# Patient Record
Sex: Male | Born: 1945
Health system: Southern US, Community
[De-identification: ages and names within clinical notes are randomized; demographics above are authoritative.]

## PROBLEM LIST (undated history)

## (undated) DIAGNOSIS — E785 Hyperlipidemia, unspecified: Secondary | ICD-10-CM

## (undated) DIAGNOSIS — I639 Cerebral infarction, unspecified: Secondary | ICD-10-CM

## (undated) DIAGNOSIS — I509 Heart failure, unspecified: Secondary | ICD-10-CM

## (undated) DIAGNOSIS — I10 Essential (primary) hypertension: Secondary | ICD-10-CM

## (undated) DIAGNOSIS — H409 Unspecified glaucoma: Secondary | ICD-10-CM

## (undated) DIAGNOSIS — D649 Anemia, unspecified: Secondary | ICD-10-CM

## (undated) DIAGNOSIS — I4891 Unspecified atrial fibrillation: Secondary | ICD-10-CM

## (undated) HISTORY — PX: BACK SURGERY: SHX140

## (undated) HISTORY — DX: Heart failure, unspecified: I50.9

## (undated) HISTORY — DX: Hyperlipidemia, unspecified: E78.5

## (undated) HISTORY — DX: Essential (primary) hypertension: I10

## (undated) HISTORY — PX: OTHER SURGICAL HISTORY: SHX169

---

## 2016-05-06 ENCOUNTER — Other Ambulatory Visit: Payer: Self-pay | Admitting: Neurosurgery

## 2016-05-06 DIAGNOSIS — M47816 Spondylosis without myelopathy or radiculopathy, lumbar region: Secondary | ICD-10-CM

## 2016-05-22 ENCOUNTER — Ambulatory Visit
Admission: RE | Admit: 2016-05-22 | Discharge: 2016-05-22 | Disposition: A | Discharge status: Home / Self Care | Payer: Medicare Other | Source: Ambulatory Visit | Attending: Neurosurgery | Admitting: Neurosurgery

## 2016-05-22 DIAGNOSIS — M47816 Spondylosis without myelopathy or radiculopathy, lumbar region: Secondary | ICD-10-CM

## 2016-05-22 MED ORDER — METHYLPREDNISOLONE ACETATE 40 MG/ML INJ SUSP (RADIOLOG
120.0000 mg | Freq: Once | INTRAMUSCULAR | Status: AC
  Administered 2016-05-22: 120 mg via EPIDURAL

## 2016-05-22 MED ORDER — IOPAMIDOL (ISOVUE-M 200) INJECTION 41%
1.0000 mL | Freq: Once | INTRAMUSCULAR | Status: AC
  Administered 2016-05-22: 1 mL via EPIDURAL

## 2016-05-22 NOTE — Discharge Instructions (Signed)

## 2016-09-07 ENCOUNTER — Other Ambulatory Visit: Payer: Self-pay | Admitting: Nurse Practitioner

## 2016-09-07 DIAGNOSIS — M47816 Spondylosis without myelopathy or radiculopathy, lumbar region: Secondary | ICD-10-CM

## 2016-09-17 ENCOUNTER — Ambulatory Visit
Admission: RE | Admit: 2016-09-17 | Discharge: 2016-09-17 | Disposition: A | Discharge status: Home / Self Care | Payer: Medicare Other | Source: Ambulatory Visit | Attending: Nurse Practitioner | Admitting: Nurse Practitioner

## 2016-09-17 DIAGNOSIS — M47816 Spondylosis without myelopathy or radiculopathy, lumbar region: Secondary | ICD-10-CM

## 2016-09-17 MED ORDER — METHYLPREDNISOLONE ACETATE 40 MG/ML INJ SUSP (RADIOLOG
120.0000 mg | Freq: Once | INTRAMUSCULAR | Status: AC
  Administered 2016-09-17: 120 mg via EPIDURAL

## 2016-09-17 MED ORDER — IOPAMIDOL (ISOVUE-M 200) INJECTION 41%
1.0000 mL | Freq: Once | INTRAMUSCULAR | Status: AC
  Administered 2016-09-17: 1 mL via EPIDURAL

## 2016-09-17 NOTE — Discharge Instructions (Signed)

## 2020-03-07 ENCOUNTER — Other Ambulatory Visit: Payer: Self-pay | Admitting: Internal Medicine

## 2020-03-07 DIAGNOSIS — M5416 Radiculopathy, lumbar region: Secondary | ICD-10-CM

## 2020-03-07 DIAGNOSIS — M5136 Other intervertebral disc degeneration, lumbar region: Secondary | ICD-10-CM

## 2020-03-19 ENCOUNTER — Other Ambulatory Visit: Payer: Self-pay

## 2020-03-19 ENCOUNTER — Ambulatory Visit
Admission: RE | Admit: 2020-03-19 | Discharge: 2020-03-19 | Disposition: A | Discharge status: Home / Self Care | Payer: Medicare Other | Source: Ambulatory Visit | Attending: Internal Medicine | Admitting: Internal Medicine

## 2020-03-19 DIAGNOSIS — M5136 Other intervertebral disc degeneration, lumbar region: Secondary | ICD-10-CM

## 2020-03-19 DIAGNOSIS — M5416 Radiculopathy, lumbar region: Secondary | ICD-10-CM

## 2020-03-19 MED ORDER — IOPAMIDOL (ISOVUE-M 200) INJECTION 41%
1.0000 mL | Freq: Once | INTRAMUSCULAR | Status: AC
  Administered 2020-03-19: 1 mL via EPIDURAL

## 2020-03-19 MED ORDER — METHYLPREDNISOLONE ACETATE 40 MG/ML INJ SUSP (RADIOLOG
120.0000 mg | Freq: Once | INTRAMUSCULAR | Status: AC
  Administered 2020-03-19: 120 mg via EPIDURAL

## 2020-03-19 NOTE — Discharge Instructions (Signed)

## 2020-07-25 ENCOUNTER — Inpatient Hospital Stay (HOSPITAL_COMMUNITY): Payer: Medicare Other | Attending: Hematology | Admitting: Hematology

## 2020-07-25 ENCOUNTER — Encounter (HOSPITAL_COMMUNITY): Payer: Self-pay

## 2020-07-25 ENCOUNTER — Other Ambulatory Visit: Payer: Self-pay

## 2020-07-25 ENCOUNTER — Inpatient Hospital Stay (HOSPITAL_COMMUNITY): Payer: Medicare Other

## 2020-07-25 ENCOUNTER — Encounter (HOSPITAL_COMMUNITY): Payer: Self-pay | Admitting: Hematology

## 2020-07-25 VITALS — BP 107/56 | HR 87 | Temp 97.1°F | Resp 18 | Ht 65.5 in | Wt 163.7 lb

## 2020-07-25 DIAGNOSIS — R233 Spontaneous ecchymoses: Secondary | ICD-10-CM | POA: Diagnosis not present

## 2020-07-25 DIAGNOSIS — D72829 Elevated white blood cell count, unspecified: Secondary | ICD-10-CM | POA: Diagnosis not present

## 2020-07-25 DIAGNOSIS — I10 Essential (primary) hypertension: Secondary | ICD-10-CM | POA: Insufficient documentation

## 2020-07-25 DIAGNOSIS — E790 Hyperuricemia without signs of inflammatory arthritis and tophaceous disease: Secondary | ICD-10-CM | POA: Insufficient documentation

## 2020-07-25 DIAGNOSIS — G20 Parkinson's disease: Secondary | ICD-10-CM | POA: Diagnosis not present

## 2020-07-25 DIAGNOSIS — Z87891 Personal history of nicotine dependence: Secondary | ICD-10-CM | POA: Diagnosis not present

## 2020-07-25 DIAGNOSIS — Z8673 Personal history of transient ischemic attack (TIA), and cerebral infarction without residual deficits: Secondary | ICD-10-CM

## 2020-07-25 DIAGNOSIS — C9210 Chronic myeloid leukemia, BCR/ABL-positive, not having achieved remission: Secondary | ICD-10-CM | POA: Diagnosis present

## 2020-07-25 DIAGNOSIS — G20A1 Parkinson's disease without dyskinesia, without mention of fluctuations: Secondary | ICD-10-CM | POA: Insufficient documentation

## 2020-07-25 DIAGNOSIS — Z809 Family history of malignant neoplasm, unspecified: Secondary | ICD-10-CM | POA: Diagnosis not present

## 2020-07-25 DIAGNOSIS — I214 Non-ST elevation (NSTEMI) myocardial infarction: Secondary | ICD-10-CM

## 2020-07-25 DIAGNOSIS — R634 Abnormal weight loss: Secondary | ICD-10-CM | POA: Insufficient documentation

## 2020-07-25 DIAGNOSIS — G459 Transient cerebral ischemic attack, unspecified: Secondary | ICD-10-CM | POA: Insufficient documentation

## 2020-07-25 HISTORY — DX: Parkinson's disease without dyskinesia, without mention of fluctuations: G20.A1

## 2020-07-25 HISTORY — DX: Parkinson's disease: G20

## 2020-07-25 LAB — CBC WITH DIFFERENTIAL/PLATELET
Abs Immature Granulocytes: 41.99 10*3/uL — ABNORMAL HIGH (ref 0.00–0.07)
Band Neutrophils: 4 %
Basophils Absolute: 1.4 10*3/uL — ABNORMAL HIGH (ref 0.0–0.1)
Basophils Relative: 1 %
Blasts: 0 %
Eosinophils Absolute: 5.8 10*3/uL — ABNORMAL HIGH (ref 0.0–0.5)
Eosinophils Relative: 4 %
HCT: 41.1 % (ref 39.0–52.0)
Hemoglobin: 13.3 g/dL (ref 13.0–17.0)
Lymphocytes Relative: 4 %
Lymphs Abs: 5.8 10*3/uL — ABNORMAL HIGH (ref 0.7–4.0)
MCH: 33.4 pg (ref 26.0–34.0)
MCHC: 32.4 g/dL (ref 30.0–36.0)
MCV: 103.3 fL — ABNORMAL HIGH (ref 80.0–100.0)
Metamyelocytes Relative: 8 %
Monocytes Absolute: 2.9 10*3/uL — ABNORMAL HIGH (ref 0.1–1.0)
Monocytes Relative: 2 %
Myelocytes: 12 %
Neutro Abs: 85.4 10*3/uL — ABNORMAL HIGH (ref 1.7–7.7)
Neutrophils Relative %: 55 %
Other: 1 %
Platelets: 151 10*3/uL (ref 150–400)
Promyelocytes Relative: 9 %
RBC: 3.98 MIL/uL — ABNORMAL LOW (ref 4.22–5.81)
RDW: 16.5 % — ABNORMAL HIGH (ref 11.5–15.5)
WBC: 144.8 10*3/uL (ref 4.0–10.5)
nRBC: 0 /100 WBC
nRBC: 0.6 % — ABNORMAL HIGH (ref 0.0–0.2)

## 2020-07-25 LAB — COMPREHENSIVE METABOLIC PANEL
ALT: 9 U/L (ref 0–44)
AST: 23 U/L (ref 15–41)
Albumin: 3.8 g/dL (ref 3.5–5.0)
Alkaline Phosphatase: 64 U/L (ref 38–126)
Anion gap: 9 (ref 5–15)
BUN: 20 mg/dL (ref 8–23)
CO2: 24 mmol/L (ref 22–32)
Calcium: 9.3 mg/dL (ref 8.9–10.3)
Chloride: 104 mmol/L (ref 98–111)
Creatinine, Ser: 1.09 mg/dL (ref 0.61–1.24)
GFR calc non Af Amer: >60 mL/min (ref >60)
Glucose, Bld: 176 mg/dL — ABNORMAL HIGH (ref 70–99)
Potassium: 4 mmol/L (ref 3.5–5.1)
Sodium: 137 mmol/L (ref 135–145)
Total Bilirubin: 0.6 mg/dL (ref 0.3–1.2)
Total Protein: 6.5 g/dL (ref 6.5–8.1)

## 2020-07-25 LAB — URIC ACID: Uric Acid, Serum: 9 mg/dL — ABNORMAL HIGH (ref 3.7–8.6)

## 2020-07-25 LAB — RETICULOCYTES
Immature Retic Fract: 35.1 % — ABNORMAL HIGH (ref 2.3–15.9)
RBC.: 3.93 MIL/uL — ABNORMAL LOW (ref 4.22–5.81)
Retic Count, Absolute: 126.9 10*3/uL (ref 19.0–186.0)
Retic Ct Pct: 3.2 % — ABNORMAL HIGH (ref 0.4–3.1)

## 2020-07-25 LAB — PROTIME-INR
INR: 1.1 (ref 0.8–1.2)
Prothrombin Time: 13.8 seconds (ref 11.4–15.2)

## 2020-07-25 LAB — APTT: aPTT: 26 seconds (ref 24–36)

## 2020-07-25 LAB — FIBRINOGEN: Fibrinogen: 395 mg/dL (ref 210–475)

## 2020-07-25 LAB — LACTATE DEHYDROGENASE: LDH: 634 U/L — ABNORMAL HIGH (ref 98–192)

## 2020-07-25 MED ORDER — ALLOPURINOL 300 MG PO TABS: 300.0000 mg | ORAL_TABLET | Freq: Every day | ORAL | 1 refills | Status: DC

## 2020-07-25 NOTE — Patient Instructions (Signed)
[place] at [facility] Discharge Instructions  You were seen and examined today by Dr. Delton Coombes. Dr. Delton Coombes is a medical oncologist and hematologist, meaning he specializes in the medical management of cancer and blood disorders. Dr. Delton Coombes discussed your past medical history, current health status and family history of cancer. Dr. Delton Coombes discussed the events that lead to you being referred to the [facility].   Your white blood cells were very elevated. Dr. Delton Coombes has ordered some blood work to assess for acute versus chronic leukemia, which is a type of blood cancer. We will follow-up with you following the blood work in the next two weeks.    Thank you for choosing [place] at [facility] to provide your oncology and hematology care.  To afford each patient quality time with our provider, please arrive at least 15 minutes before your scheduled appointment time.   If you have a lab appointment with the [facility] please come in thru the Main Entrance and check in at the main information desk.  You need to re-schedule your appointment should you arrive 10 or more minutes late.  We strive to give you quality time with our providers, and arriving late affects you and other patients whose appointments are after yours.  Also, if you no show three or more times for appointments you may be dismissed from the clinic at the providers discretion.     Again, thank you for choosing [facility].  Our hope is that these requests will decrease the amount of time that you wait before being seen by our physicians.       _____________________________________________________________  Should you have questions after your visit to [facility], please contact our office at [phone] and follow the prompts.  Our office hours are 8:00 a.m. and 4:30 p.m. Monday - Friday.  Please note that voicemails left after 4:00  p.m. may not be returned until the following business day.  We are closed weekends and major holidays.  You do have access to a nurse 24-7, just call the main number to the clinic [phone] and do not press any options, hold on the line and a nurse will answer the phone.    For prescription refill requests, have your pharmacy contact our office and allow 72 hours.    Due to Covid, you will need to wear a mask upon entering the hospital. If you do not have a mask, a mask will be given to you at the Main Entrance upon arrival. For doctor visits, patients may have 1 support person age 93 or older with them. For treatment visits, patients can not have anyone with them due to social distancing guidelines and our immunocompromised population.

## 2020-07-25 NOTE — Progress Notes (Signed)
Critical value alert  WBC 144  Dr. Delton Coombes aware.  Orders received for allopruinol 300 mg.    We called patient and notified him of his lab values and the new prescription.  He verbalizes understanding.

## 2020-07-25 NOTE — Progress Notes (Signed)
I placed an introductory phone call to this patient today. I introduced myself and explained my role in the patient's care. The patient reports that he is overall feeling well, with the exception of chronic stiffness that started at least a couple of years ago. I briefly explained what he can expect during his initial appt with Dr. Delton Coombes. Patient reports no known barriers to care at this time. I provided my contact information and encouraged the patient to call with any questions or concerns.

## 2020-07-25 NOTE — Progress Notes (Signed)
[place] [address] [place] [address]   CLINIC:  Medical Oncology/Hematology  Patient Care Team: Lonia Mad, MD as PCP - General (Internal Medicine) Dishmon, Garwin Brothers, RN as Oncology Nurse Navigator (Oncology) Derek Jack, MD as Consulting Physician (Hematology)  CHIEF COMPLAINTS/PURPOSE OF CONSULTATION:  Evaluation of leukocytosis  HISTORY OF PRESENTING ILLNESS:  Kevin Love 74 y.o. male is here because of evaluation of leukocytosis, at the request of Dr. Lonia Mad from [facility] in [place]. His WBC count was 148 on 10/6.  Today he is accompanied by his wife, [name]. He reports that he has been having easy bruising on his arms intermittently in the last 6 months. He has lost about 10 lbs in the past 6 months. He denies easy bruising anywhere else, nosebleeds, hematuria or hematochezia. He has had Parkinson's disease for the past 6 years. He went to [facility] to get a cataract lasered off and his vision has improved. He reports that he has been tiring quicker over the past 4-5 years. His appetite is good. He has lost about 10 lbs in the past 6 months. He is not currently on any blood thinners. He denies having any recent infections, F/C, or night sweats. He was hospitalized for TIA about 4 years ago.  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 He quit smoking in 31-Jan-1989. His mother died from cancer. He is able to do all of his chores at home.   MEDICAL HISTORY:  History reviewed. No pertinent past medical history.  SURGICAL HISTORY: The histories are not reviewed yet. Please review them in the "History" navigator section and refresh this [place].  SOCIAL HISTORY: Social History   Socioeconomic History  . Marital status: Married    Spouse name: Not on file  . Number of children: Not on file  . Years of education: Not on file  . Highest education level: Not on  file  Occupational History  . Not on file  Tobacco Use  . Smoking status: Not on file  Substance and Sexual Activity  . Alcohol use: Not on file  . Drug use: Not on file  . Sexual activity: Not on file  Other Topics Concern  . Not on file  Social History Narrative  . Not on file   Social Determinants of Health   Financial Resource Strain:   . Difficulty of Paying Living Expenses: Not on file  Food Insecurity:   . Worried About Charity fundraiser in the Last Year: Not on file  . Ran Out of Food in the Last Year: Not on file  Transportation Needs:   . Lack of Transportation (Medical): Not on file  . Lack of Transportation (Non-Medical): Not on file  Physical Activity:   . Days of Exercise per Week: Not on file  . Minutes of Exercise per Session: Not on file  Stress:   . Feeling of Stress : Not on file  Social Connections:   . Frequency of Communication with Friends and Family: Not on file  . Frequency of Social Gatherings with Friends and Family: Not on file  . Attends Religious Services: Not on file  . Active Member of Clubs or Organizations: Not on file  . Attends Archivist Meetings: Not on file  . Marital Status: Not on file  Intimate Partner Violence:   . Fear of Current or Ex-Partner: Not on file  . Emotionally Abused: Not  on file  . Physically Abused: Not on file  . Sexually Abused: Not on file    FAMILY HISTORY: History reviewed. No pertinent family history.  ALLERGIES:  is allergic to penicillins.  MEDICATIONS:  Current Outpatient Medications  Medication Sig Dispense Refill  . ascorbic acid (QC VITAMIN C) 500 MG tablet daily    . Aspirin 81 MG CAPS Take by mouth.    . B Complex Vitamins (RA B-COMPLEX) TABS     . carbidopa-levodopa (SINEMET IR) 25-100 MG tablet Take by mouth.    . Cholecalciferol 50 MCG (2000 UT) CAPS     . donepezil (ARICEPT) 5 MG tablet     . gemfibrozil (LOPID) 600 MG tablet Take by mouth.    Marland Kitchen ibuprofen (ADVIL) 200 MG  tablet Take by mouth.    . latanoprost (XALATAN) 0.005 % ophthalmic solution     . lisinopril (ZESTRIL) 20 MG tablet     . Multiple Vitamins-Minerals (CENTRUM SILVER ADULT 50+) TABS     . tamsulosin (FLOMAX) 0.4 MG CAPS capsule Take by mouth.    Marland Kitchen VITAMIN E PO daily    . allopurinol (ZYLOPRIM) 300 MG tablet Take 1 tablet (300 mg total) by mouth daily. 30 tablet 1   No current facility-administered medications for this visit.    REVIEW OF SYSTEMS:   Review of Systems  Constitutional: Positive for fatigue (60%). Negative for appetite change, chills, diaphoresis, fever and unexpected weight change.  HENT:   Negative for nosebleeds.   Gastrointestinal: Negative for blood in stool.  Genitourinary: Positive for nocturia. Negative for hematuria.   Musculoskeletal: Positive for back pain (4/10 back and legs pain).  Hematological: Bruises/bleeds easily (bruising in arms for past 6 months).     PHYSICAL EXAMINATION: ECOG PERFORMANCE STATUS: 1 - Symptomatic but completely ambulatory  Vitals:   07/25/20 1358  BP: (!) 107/56  Pulse: 87  Resp: 18  Temp: (!) 97.1 F (36.2 C)  SpO2: 97%   Filed Weights   07/25/20 1358  Weight: 163 lb 11.2 oz (74.3 kg)   Physical Exam Vitals reviewed.  Constitutional:      Appearance: Normal appearance.  HENT:     Mouth/Throat:     Lips: No lesions.     Mouth: No oral lesions.     Dentition: No gum lesions.     Tongue: No lesions.     Palate: No mass.     Pharynx: No pharyngeal swelling or posterior oropharyngeal erythema.     Tonsils: No tonsillar exudate.  Cardiovascular:     Rate and Rhythm: Normal rate and regular rhythm.     Pulses: Normal pulses.     Heart sounds: Normal heart sounds.  Pulmonary:     Effort: Pulmonary effort is normal.     Breath sounds: Normal breath sounds.  Abdominal:     Palpations: Abdomen is soft. There is no hepatomegaly, splenomegaly or mass.     Tenderness: There is no abdominal tenderness.     Hernia: No  hernia is present.  Musculoskeletal:     Right lower leg: No edema.     Left lower leg: No edema.  Lymphadenopathy:     Upper Body:     Right upper body: No supraclavicular, axillary or pectoral adenopathy.     Left upper body: No supraclavicular, axillary or pectoral adenopathy.     Lower Body: No right inguinal adenopathy. No left inguinal adenopathy.  Neurological:     General: No focal deficit present.  Mental Status: He is alert and oriented to person, place, and time.  Psychiatric:        Mood and Affect: Mood normal.        Behavior: Behavior normal.      LABORATORY DATA:  I have reviewed the data as listed Recent Results (from the past 2160 hour(s))  Reticulocytes     Status: Abnormal   Collection Time: 07/25/20  2:43 PM  Result Value Ref Range   Retic Ct Pct 3.2 (H) 0.4 - 3.1 %   RBC. 3.93 (L) 4.22 - 5.81 MIL/uL   Retic Count, Absolute 126.9 19.0 - 186.0 K/uL   Immature Retic Fract 35.1 (H) 2.3 - 15.9 %    Comment: Performed at [facility], [address], [place], [place] [address]  APTT     Status: None   Collection Time: 07/25/20  3:26 PM  Result Value Ref Range   aPTT 26 24 - 36 seconds    Comment: Performed at [facility], [address] [place], [place] [address]  Protime-INR     Status: None   Collection Time: 07/25/20  3:26 PM  Result Value Ref Range   Prothrombin Time 13.8 11.4 - 15.2 seconds   INR 1.1 0.8 - 1.2    Comment: (NOTE) INR goal varies based on device and disease states. Performed at [facility], [address], [place], [place] [address]   Fibrinogen     Status: None   Collection Time: 07/25/20  3:26 PM  Result Value Ref Range   Fibrinogen 395 210 - 475 mg/dL    Comment: Performed at [facility], [address] [place], [place] [address]  Uric acid     Status: Abnormal   Collection Time: 07/25/20  3:26 PM  Result Value Ref Range   Uric Acid, Serum 9.0 (H) 3.7 - 8.6 mg/dL    Comment: Performed at [facility], [address],  [place], [place] [address]  Lactate dehydrogenase     Status: Abnormal   Collection Time: 07/25/20  3:26 PM  Result Value Ref Range   LDH 634 (H) 98 - 192 U/L    Comment: Performed at [facility], [address] [place],  [address]  CBC with Differential     Status: Abnormal   Collection Time: 07/25/20  3:26 PM  Result Value Ref Range   WBC 144.8 (HH) 4.0 - 10.5 K/uL    Comment: CRITICAL RESULT CALLED TO, READ BACK BY AND VERIFIED WITH: DIANE WILSON @1621  07/25/20 BY TJ    RBC 3.98 (L) 4.22 - 5.81 MIL/uL   Hemoglobin 13.3 13.0 - 17.0 g/dL   HCT 41.1 39 - 52 %   MCV 103.3 (H) 80.0 - 100.0 fL   MCH 33.4 26.0 - 34.0 pg   MCHC 32.4 30.0 - 36.0 g/dL   RDW 16.5 (H) 11.5 - 15.5 %   Platelets 151 150 - 400 K/uL   nRBC 0.6 (H) 0.0 - 0.2 %   Neutrophils Relative % 55 %    Comment: RBV; DIANE WILSON @1631  07/25/20 BY TJ   Lymphocytes Relative 4 %    Comment: RBV; DIANE WILSON @1631  07/25/20 BY TJ   Monocytes Relative 2 %    Comment: RBV; DIANE WILSON @1631  07/25/20 BY TJ   Eosinophils Relative 4 %    Comment: RBV; DIANE WILSON @1631  07/25/20 BY TJ   Basophils Relative 1 %    Comment: RBV; DIANE WILSON @1631  07/25/20 BY TJ   Band Neutrophils  4 %    Comment: RBV; DIANE WILSON @1631  52/8/41 BY TJ   Metamyelocytes Relative 8 %    Comment: RBV; DIANE WILSON @1631  07/25/20 BY TJ   Myelocytes 12 %    Comment: RBV; DIANE WILSON @1631  07/25/20 BY TJ   Promyelocytes Relative 9 %    Comment: RBV; DIANE WILSON @1631  07/25/20 BY TJ   Blasts 0 %    Comment: RBV; DIANE WILSON @1631  07/25/20 BY TJ   nRBC 0 0 /100 WBC    Comment: RBV; DIANE WILSON @1631  07/25/20 BY TJ   Other 1 %    Comment: RBV; DIANE WILSON @1631  07/25/20 BY TJ   Neutro Abs 85.4 (H) 1.7 - 7.7 K/uL    Comment: RBV; DIANE WILSON @1631  07/25/20 BY TJ   Lymphs Abs 5.8 (H) 0.7 - 4.0 K/uL    Comment: RBV; DIANE WILSON @1631  07/25/20 BY TJ   Monocytes Absolute 2.9 (H) 0.1 - 1.0 K/uL    Comment: RBV; DIANE WILSON @1631  07/25/20 BY TJ    Eosinophils Absolute 5.8 (H) 0 - 0 K/uL    Comment: RBV; DIANE WILSON @1631  07/25/20 BY TJ   Basophils Absolute 1.4 (H) 0 - 0 K/uL    Comment: RBV; DIANE WILSON @1631  07/25/20 BY TJ   Abs Immature Granulocytes 41.99 (H) 0.00 - 0.07 K/uL    Comment: RBV; DIANE WILSON @1631  07/25/20 BY TJ   WBC Morphology      MODERATE LEFT SHIFT (>5% METAS AND MYELOS,OCC PRO NOTED)    Comment: RBV; DIANE WILSON @1631  07/25/20 BY TJ Performed at [facility], [address] [place], [place] [address]   Comprehensive metabolic panel     Status: Abnormal   Collection Time: 07/25/20  3:26 PM  Result Value Ref Range   Sodium 137 135 - 145 mmol/L   Potassium 4.0 3.5 - 5.1 mmol/L   Chloride 104 98 - 111 mmol/L   CO2 24 22 - 32 mmol/L   Glucose, Bld 176 (H) 70 - 99 mg/dL    Comment: Glucose reference range applies only to samples taken after fasting for at least 8 hours.   BUN 20 8 - 23 mg/dL   Creatinine, Ser 1.09 0.61 - 1.24 mg/dL   Calcium 9.3 8.9 - 10.3 mg/dL   Total Protein 6.5 6.5 - 8.1 g/dL   Albumin 3.8 3.5 - 5.0 g/dL   AST 23 15 - 41 U/L   ALT 9 0 - 44 U/L   Alkaline Phosphatase 64 38 - 126 U/L   Total Bilirubin 0.6 0.3 - 1.2 mg/dL   GFR calc non Af Amer >60 >60 mL/min   Anion gap 9 5 - 15    Comment: Performed at [facility], [address], [place], [place] [address]    RADIOGRAPHIC STUDIES: I have personally reviewed the radiological images as listed and agreed with the findings in the report.  ASSESSMENT:  1.  Hyperleukocytosis: -Mr. Kemler is evaluated at the request of Dr. Lonia Mad for hyperleukocytosis. -CBC on 07/23/2020 shows white count 148.3 with differential showing neutrophils, bands, promyelocytes, myelocytes and occasional blasts.  Platelet count was 138 and hemoglobin 12.9. -CBC on 03/05/2020 shows white count 15.2 with hemoglobin 15.4 and normal platelet count. -CBC in November 2020 showed white count 10.3 with normal hemoglobin and platelets. -His wife noted 10 pound  weight loss in the last 6 months. -Patient reports easy bruising in the last 6 months, mainly on the upper extremities.  Denies any fevers or  night sweats.  No recent infections or hospitalizations. -Last hospitalization was approximately 3 to 4 years ago for TIA.  2.  Social/family history: -He lives at home with his wife.  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  He quit smoking in Jan 29, 1989. -Mother died of cancer, type not known to the patient.   PLAN:  1.  Hyperleukocytosis: -His prior CBC from Monday is mostly left shifted.  Physical exam does not show any palpable adenopathy or splenomegaly. -I will repeat his CBC and review his blood smear.  We will check LDH. -I talked to him about differential diagnosis including acute myeloid leukemia versus chronic myeloid leukemia. -We will send for flow cytometry and a BCR/ABL by FISH. -I will reevaluate him within 1 week.  2.  Easy bruising: -I have checked coags in our office.  PT, PTT and fibrinogen was normal.  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 -We will start him on allopurinol 300 mg daily.  All questions were answered. The patient knows to call the clinic with any problems, questions or concerns.  Derek Jack, MD 07/25/20 4:49 PM  [place] [phone]   I, Milinda Antis, am acting as a scribe for Dr. Sanda Linger.  I, Derek Jack MD, have reviewed the above documentation for accuracy and completeness, and I agree with the above.

## 2020-07-26 LAB — SAVE SMEAR(SSMR), FOR PROVIDER SLIDE REVIEW

## 2020-07-29 LAB — SURGICAL PATHOLOGY

## 2020-07-30 LAB — PATHOLOGIST SMEAR REVIEW: Path Review: 10112021

## 2020-07-31 LAB — BCR-ABL1 FISH
Cells Analyzed: 100
Cells Counted: 100

## 2020-08-05 ENCOUNTER — Other Ambulatory Visit (HOSPITAL_COMMUNITY): Payer: Self-pay | Admitting: Hematology

## 2020-08-05 ENCOUNTER — Other Ambulatory Visit: Payer: Self-pay

## 2020-08-05 ENCOUNTER — Inpatient Hospital Stay (HOSPITAL_BASED_OUTPATIENT_CLINIC_OR_DEPARTMENT_OTHER): Payer: Medicare Other | Admitting: Hematology

## 2020-08-05 VITALS — BP 119/60 | HR 76 | Temp 98.1°F | Resp 16 | Wt 165.8 lb

## 2020-08-05 DIAGNOSIS — C9210 Chronic myeloid leukemia, BCR/ABL-positive, not having achieved remission: Secondary | ICD-10-CM | POA: Insufficient documentation

## 2020-08-05 MED ORDER — DASATINIB 70 MG PO TABS: 70.0000 mg | ORAL_TABLET | Freq: Every day | ORAL | 1 refills | Status: DC

## 2020-08-05 NOTE — Progress Notes (Signed)
[place], [place] [address]   CLINIC:  Medical Oncology/Hematology  PCP:  Lonia Mad, MD No address on file  None  REASON FOR VISIT:  Follow-up for CML  PRIOR THERAPY: None  CURRENT THERAPY: Under work-up  INTERVAL HISTORY:  [name], a 73 y.o. 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 Kevin Love was last seen on 07/25/2020.  Today he is accompanied by his wife and he reports feeling well. He started taking allopurinol. He denies having any cardiac or pulmonary issues or stents placed. His easy bruising has resolved. He denies having headaches or vision changes.   REVIEW OF SYSTEMS:  Review of Systems  Constitutional: Positive for fatigue (60%). Negative for appetite change.  Eyes: Negative for eye problems.  Musculoskeletal: Positive for back pain.  Neurological: Negative for headaches.  Hematological: Does not bruise/bleed easily.  All other systems reviewed and are negative.   PAST MEDICAL/SURGICAL HISTORY:  No past medical history on file. No past surgical history on file.  SOCIAL HISTORY:  Social History   Socioeconomic History  . Marital status: Married    Spouse name: Not on file  . Number of children: Not on file  . Years of education: Not on file  . Highest education level: Not on file  Occupational History  . Not on file  Tobacco Use  . Smoking status: Not on file  Substance and Sexual Activity  . Alcohol use: Not on file  . Drug use: Not on file  . Sexual activity: Not on file  Other Topics Concern  . Not on file  Social History Narrative  . Not on file   Social Determinants of Health   Financial Resource Strain:   . Difficulty of Paying Living Expenses: Not on file  Food Insecurity:   . Worried About Charity fundraiser in the Last Year: Not on file  . Ran Out of Food in the Last Year: Not on file  Transportation Needs:   . Lack of Transportation (Medical): Not on file  . Lack of  Transportation (Non-Medical): Not on file  Physical Activity:   . Days of Exercise per Week: Not on file  . Minutes of Exercise per Session: Not on file  Stress:   . Feeling of Stress : Not on file  Social Connections:   . Frequency of Communication with Friends and Family: Not on file  . Frequency of Social Gatherings with Friends and Family: Not on file  . Attends Religious Services: Not on file  . Active Member of Clubs or Organizations: Not on file  . Attends Archivist Meetings: Not on file  . Marital Status: Not on file  Intimate Partner Violence:   . Fear of Current or Ex-Partner: Not on file  . Emotionally Abused: Not on file  . Physically Abused: Not on file  . Sexually Abused: Not on file    FAMILY HISTORY:  No family history on file.  CURRENT MEDICATIONS:  Current Outpatient Medications  Medication Sig Dispense Refill  . allopurinol (ZYLOPRIM) 300 MG tablet Take 1 tablet (300 mg total) by mouth daily. 30 tablet 1  . ascorbic acid (QC VITAMIN C) 500 MG tablet daily    . Aspirin 81 MG CAPS Take by mouth.    . B Complex Vitamins (RA B-COMPLEX) TABS     . carbidopa-levodopa (SINEMET IR) 25-100 MG tablet Take by mouth.    . Cholecalciferol 50 MCG (2000 UT) CAPS     .  donepezil (ARICEPT) 5 MG tablet     . gemfibrozil (LOPID) 600 MG tablet Take by mouth.    Marland Kitchen ibuprofen (ADVIL) 200 MG tablet Take by mouth.    . latanoprost (XALATAN) 0.005 % ophthalmic solution     . lisinopril (ZESTRIL) 20 MG tablet     . Multiple Vitamins-Minerals (CENTRUM SILVER ADULT 50+) TABS     . tamsulosin (FLOMAX) 0.4 MG CAPS capsule Take by mouth.    Marland Kitchen VITAMIN E PO daily     No current facility-administered medications for this visit.    ALLERGIES:  Allergies  Allergen Reactions  . Penicillins Rash    PHYSICAL EXAM:  Performance status (ECOG): 1 - Symptomatic but completely ambulatory  Vitals:   08/05/20 1132  BP: 119/60  Pulse: 76  Resp: 16  Temp: 98.1 F (36.7 C)    SpO2: 95%   Wt Readings from Last 3 Encounters:  08/05/20 165 lb 12.8 oz (75.2 kg)  07/25/20 163 lb 11.2 oz (74.3 kg)   Physical Exam Vitals reviewed.  Constitutional:      Appearance: Normal appearance.  Cardiovascular:     Rate and Rhythm: Normal rate and regular rhythm.     Pulses: Normal pulses.     Heart sounds: Normal heart sounds.  Pulmonary:     Effort: Pulmonary effort is normal.     Breath sounds: Normal breath sounds.  Neurological:     General: No focal deficit present.     Mental Status: He is alert and oriented to person, place, and time.  Psychiatric:        Mood and Affect: Mood normal.        Behavior: Behavior normal.     LABORATORY DATA:   I have reviewed the labs as listed.  CBC Latest Ref Rng & Units 07/25/2020  WBC 4.0 - 10.5 K/uL 144.8(HH)  Hemoglobin 13.0 - 17.0 g/dL 13.3  Hematocrit 39 - 52 % 41.1  Platelets 150 - 400 K/uL 151   CMP Latest Ref Rng & Units 07/25/2020  Glucose 70 - 99 mg/dL 176(H)  BUN 8 - 23 mg/dL 20  Creatinine 0.61 - 1.24 mg/dL 1.09  Sodium 135 - 145 mmol/L 137  Potassium 3.5 - 5.1 mmol/L 4.0  Chloride 98 - 111 mmol/L 104  CO2 22 - 32 mmol/L 24  Calcium 8.9 - 10.3 mg/dL 9.3  Total Protein 6.5 - 8.1 g/dL 6.5  Total Bilirubin 0.3 - 1.2 mg/dL 0.6  Alkaline Phos 38 - 126 U/L 64  AST 15 - 41 U/L 23  ALT 0 - 44 U/L 9      Component Value Date/Time   RBC 3.98 (L) 07/25/2020 1526   RBC 3.93 (L) 07/25/2020 1443   MCV 103.3 (H) 07/25/2020 1526   MCH 33.4 07/25/2020 1526   MCHC 32.4 07/25/2020 1526   RDW 16.5 (H) 07/25/2020 1526   LYMPHSABS 5.8 (H) 07/25/2020 1526   MONOABS 2.9 (H) 07/25/2020 1526   EOSABS 5.8 (H) 07/25/2020 1526   BASOSABS 1.4 (H) 07/25/2020 1526   Lab Results  Component Value Date   LDH 634 (H) 07/25/2020    DIAGNOSTIC IMAGING:  I have independently reviewed the scans and discussed with the patient. No results found.   ASSESSMENT:  1.  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 Lonia Mad for hyperleukocytosis. -CBC on 07/23/2020 shows white count 148.3 with differential showing neutrophils, bands, promyelocytes, myelocytes and occasional blasts.  Platelet count was 138  and hemoglobin 12.9. -CBC on 03/05/2020 shows white count 15.2 with hemoglobin 15.4 and normal platelet count. -CBC in November 2020 showed white count 10.3 with normal hemoglobin and platelets. -His wife noted 10 pound weight loss in the last 6 months. -Patient reports easy bruising in the last 6 months, mainly on the upper extremities.  Denies any fevers or night sweats.  No recent infections or hospitalizations. -Last hospitalization was approximately 3 to 4 years ago for TIA.  2.  Social/family history: -He lives at home with his wife.  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  He quit smoking in 12/17/1988. -Mother died of cancer, type not known to the patient.   PLAN:  1.  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 -We discussed normal prognosis of CML. -I have recommended treatment with second-generation TKI.  We will send a prescription for dasatinib at a lower dose of 70 mg daily.  Will titrate up as tolerated. -Reviewed labs from 07/25/2020.  White count is 144 with LDH 634.  Flow cytometry did not reveal any blasts.  Hemoglobin and platelets are normal. -I have recommended bone marrow aspirate and biopsy for morphological review and cytogenetic evaluation.  We will also obtain quantitative PCR for BCR/ABL.  We will also obtain hepatitis panel. -We will start dasatinib after the bone marrow biopsy.  2.  Easy bruising: -PT, PTT and fibrinogen was normal.  Dr. Calton Dach has checked von Willebrand's panel which was within normal limits.  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 -We will plan to check uric acid with next labs.  Orders placed this encounter:   No orders of the defined types were placed in this encounter.    Derek Jack, MD [place] [phone]   I, Milinda Antis, am acting as a scribe for Dr. Sanda Linger.  I, Derek Jack MD, have reviewed the above documentation for accuracy and completeness, and I agree with the above.

## 2020-08-05 NOTE — Patient Instructions (Signed)
[place] at [facility] Discharge Instructions  You were seen today by Dr. Delton Coombes. 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 You will be scheduled to have a bone marrow biopsy done to get your baseline. The medication you will be prescribed for the leukemia is called dasatinib taken daily. It will take about 1 week to receive the medication and don't take it until you have the bone marrow biopsy done. Dr. Delton Coombes will see you back in 1 week for labs and follow up.   Thank you for choosing [place] at [facility] to provide your oncology and hematology care.  To afford each patient quality time with our provider, please arrive at least 15 minutes before your scheduled appointment time.   If you have a lab appointment with the [place] please come in thru the Main Entrance and check in at the main information desk  You need to re-schedule your appointment should you arrive 10 or more minutes late.  We strive to give you quality time with our providers, and arriving late affects you and other patients whose appointments are after yours.  Also, if you no show three or more times for appointments you may be dismissed from the clinic at the providers discretion.     Again, thank you for choosing [facility]  Our hope is that these requests will decrease the amount of time that you wait before being seen by our physicians.       _____________________________________________________________  Should you have questions after your visit to [facility], please contact our office at [phone] between the hours of 8:00 a.m. and 4:30 p.m.  Voicemails left after 4:00 p.m. will not be returned until the following business day.  For prescription refill requests, have your pharmacy contact our office and allow 72 hours.    Cancer Center Support Programs:   > Cancer Support Group  2nd  Tuesday of the month 1pm-2pm, Journey Room

## 2020-08-06 ENCOUNTER — Telehealth (HOSPITAL_COMMUNITY): Payer: Self-pay | Admitting: Pharmacy Technician

## 2020-08-06 ENCOUNTER — Telehealth (HOSPITAL_COMMUNITY): Payer: Self-pay | Admitting: Pharmacist

## 2020-08-06 NOTE — Telephone Encounter (Signed)
Oral Oncology Patient Advocate Encounter  Received notification from [facility] that prior authorization for Sprycel is required.  PA submitted on CoverMyMeds Key [id number] Status is pending  Oral Oncology Clinic will continue to follow.  [place] Patient [place] Phone [phone] Fax [phone] 08/06/2020 12:52 PM

## 2020-08-06 NOTE — Telephone Encounter (Signed)
Oral Oncology Patient Advocate Encounter  Prior Authorization for Sprycel has been approved.    PA# [id number] Effective dates: 10/20/19 through 10/18/21  Patients co-pay is $2538.18.  Oral Oncology Clinic will continue to follow.   [place] Patient [place] Phone [phone] Fax [phone] 08/06/2020 1:05 PM

## 2020-08-06 NOTE — Telephone Encounter (Signed)
Oral Oncology Pharmacist Encounter  Received new prescription for Sprycel (dasatinib) for the treatment of newly diagnosed chronic phase CML, planned duration until disease progression or unacceptable drug toxicity. Plan to start following his bone marrow biopsy.   Prescription dose and frequency assessed. MD starting patient on a reduced dose and plans on increasing the dose as tolerated.  Current medication list in Epic reviewed, a few relevant DDIs with dasatinib identified: -Dasatinib may enhance the antiplatelet effect of aspirin and ibuprofen. Continue to monitor plt count. No baseline dose adjustment needed.   Evaluated chart and no patient barriers to medication adherence identified.   Prescription has been e-scribed to the [facility] for benefits analysis and approval.  Oral Oncology Clinic will continue to follow for insurance authorization, copayment issues, initial counseling and start date.  Darl Pikes, PharmD, BCPS, BCOP, CPP Hematology/Oncology Clinical Pharmacist Practitioner [facility] [phone]  08/06/2020 3:04 PM

## 2020-08-08 ENCOUNTER — Telehealth (HOSPITAL_COMMUNITY): Payer: Self-pay | Admitting: Pharmacy Technician

## 2020-08-08 MED FILL — SPRYCEL 70 MG TABLET: 70 | 30 days supply | Qty: 30 | Fill #0

## 2020-08-08 NOTE — Telephone Encounter (Signed)
Obtained free trial for 30 days of Sprycel over the phone with patient and [employer] Access Support.  Will use [facility] to dispense free trial while awaiting for assistance application.  [place] Patient [place] Phone [phone] Fax [phone] 08/08/2020 10:58 AM

## 2020-08-08 NOTE — Telephone Encounter (Signed)
Oral Chemotherapy Pharmacist Encounter  Patient will fill at [facility] using one time voucher fill while manufacturer assistance is pending. Patient knows the plan is to hold on starting his Sprycel until his bone marrow biopsy.  Patient Education I spoke with patient and his wife over speaker phone for overview of new oral chemotherapy medication: Sprycel (dasatinib) for the treatment of newly diagnosed chronic phase CML, planned duration until disease progression or unacceptable drug toxicity. Plan to start following his bone marrow biopsy.   Counseled patient on administration, dosing, side effects, monitoring, drug-food interactions, safe handling, storage, and disposal. Patient will take 1 tablet (70 mg total) by mouth daily.  Side effects include but not limited to: edema, diarrhea, decreased wbc/hgb/plt.   Reviewed with patient importance of keeping a medication schedule and plan for any missed doses.  After discussion with patient no patient barriers to medication adherence identified.   Mr. And Mrs. Laforge voiced understanding and appreciation. All questions answered. Medication handout provided.  Provided patient with [facility] phone number. Patient knows to call the office with questions or concerns. Oral Chemotherapy Navigation Clinic will continue to follow.  Darl Pikes, PharmD, BCPS, BCOP, CPP Hematology/Oncology Clinical Pharmacist Practitioner [facility] [phone]  08/08/2020 3:00 PM

## 2020-08-13 ENCOUNTER — Inpatient Hospital Stay (HOSPITAL_BASED_OUTPATIENT_CLINIC_OR_DEPARTMENT_OTHER): Payer: Medicare Other | Admitting: Hematology

## 2020-08-13 ENCOUNTER — Other Ambulatory Visit: Payer: Self-pay

## 2020-08-13 ENCOUNTER — Inpatient Hospital Stay (HOSPITAL_COMMUNITY): Payer: Medicare Other

## 2020-08-13 VITALS — BP 120/53 | HR 79 | Resp 17

## 2020-08-13 DIAGNOSIS — C9210 Chronic myeloid leukemia, BCR/ABL-positive, not having achieved remission: Secondary | ICD-10-CM | POA: Diagnosis not present

## 2020-08-13 LAB — URIC ACID: Uric Acid, Serum: 6.1 mg/dL (ref 3.7–8.6)

## 2020-08-13 LAB — CBC WITH DIFFERENTIAL/PLATELET
Band Neutrophils: 1 %
Basophils Absolute: 5.1 10*3/uL — ABNORMAL HIGH (ref 0.0–0.1)
Basophils Relative: 3 %
Blasts: 5 %
Eosinophils Absolute: 8.5 10*3/uL — ABNORMAL HIGH (ref 0.0–0.5)
Eosinophils Relative: 5 %
HCT: 40.7 % (ref 39.0–52.0)
Hemoglobin: 13.4 g/dL (ref 13.0–17.0)
Lymphocytes Relative: 6 %
Lymphs Abs: 10.2 10*3/uL — ABNORMAL HIGH (ref 0.7–4.0)
MCH: 34.2 pg — ABNORMAL HIGH (ref 26.0–34.0)
MCHC: 32.9 g/dL (ref 30.0–36.0)
MCV: 103.8 fL — ABNORMAL HIGH (ref 80.0–100.0)
Metamyelocytes Relative: 5 %
Monocytes Absolute: 10.2 10*3/uL — ABNORMAL HIGH (ref 0.1–1.0)
Monocytes Relative: 6 %
Myelocytes: 6 %
Neutro Abs: 98.8 10*3/uL — ABNORMAL HIGH (ref 1.7–7.7)
Neutrophils Relative %: 57 %
Platelets: 125 10*3/uL — ABNORMAL LOW (ref 150–400)
Promyelocytes Relative: 6 %
RBC: 3.92 MIL/uL — ABNORMAL LOW (ref 4.22–5.81)
RDW: 17.5 % — ABNORMAL HIGH (ref 11.5–15.5)
WBC: 170.3 10*3/uL (ref 4.0–10.5)
nRBC: 0.5 % — ABNORMAL HIGH (ref 0.0–0.2)

## 2020-08-13 LAB — HEPATITIS B SURFACE ANTIGEN: Hepatitis B Surface Ag: NONREACTIVE

## 2020-08-13 LAB — HEPATITIS B CORE ANTIBODY, TOTAL: Hep B Core Total Ab: NONREACTIVE

## 2020-08-13 LAB — HEPATITIS C ANTIBODY: HCV Ab: NONREACTIVE

## 2020-08-13 LAB — HEPATITIS B SURFACE ANTIBODY,QUALITATIVE: Hep B S Ab: NONREACTIVE

## 2020-08-13 NOTE — Progress Notes (Signed)
CRITICAL VALUE ALERT Critical value received:  WBC 170.3 Date of notification:  08/13/2020  Time of notification: 0905 Critical value read back:  Yes.   Nurse who received alert:  Karlyne Greenspan, RN MD notified time and response:  [place]. No orders at this time.

## 2020-08-13 NOTE — Progress Notes (Signed)
[place], [place] [address]   CLINIC:  Medical Oncology/Hematology  PCP:  Lonia Mad, MD No address on file  None  REASON FOR VISIT:  Follow-up for CML  PRIOR THERAPY: None  CURRENT THERAPY: Under work-up  INTERVAL HISTORY:  [name], a 74 y.o. male, seen for follow-up of CML. He is here today along with his wife for bone marrow biopsy. He reports that his Parkinson's is acting up today. His back pain is stable.  REVIEW OF SYSTEMS:  Review of Systems  Constitutional: Positive for fatigue (60%). Negative for appetite change.  Eyes: Negative for eye problems.  Musculoskeletal: Positive for back pain.  Neurological: Negative for headaches.  Hematological: Does not bruise/bleed easily.  All other systems reviewed and are negative.   PAST MEDICAL/SURGICAL HISTORY:  No past medical history on file. No past surgical history on file.  SOCIAL HISTORY:  Social History   Socioeconomic History  . Marital status: Married    Spouse name: Not on file  . Number of children: Not on file  . Years of education: Not on file  . Highest education level: Not on file  Occupational History  . Not on file  Tobacco Use  . Smoking status: Not on file  Substance and Sexual Activity  . Alcohol use: Not on file  . Drug use: Not on file  . Sexual activity: Not on file  Other Topics Concern  . Not on file  Social History Narrative  . Not on file   Social Determinants of Health   Financial Resource Strain:   . Difficulty of Paying Living Expenses: Not on file  Food Insecurity:   . Worried About Charity fundraiser in the Last Year: Not on file  . Ran Out of Food in the Last Year: Not on file  Transportation Needs:   . Lack of Transportation (Medical): Not on file  . Lack of Transportation (Non-Medical): Not on file  Physical Activity:   . Days of Exercise per Week: Not on file  . Minutes of Exercise per Session: Not on file    Stress:   . Feeling of Stress : Not on file  Social Connections:   . Frequency of Communication with Friends and Family: Not on file  . Frequency of Social Gatherings with Friends and Family: Not on file  . Attends Religious Services: Not on file  . Active Member of Clubs or Organizations: Not on file  . Attends Archivist Meetings: Not on file  . Marital Status: Not on file  Intimate Partner Violence:   . Fear of Current or Ex-Partner: Not on file  . Emotionally Abused: Not on file  . Physically Abused: Not on file  . Sexually Abused: Not on file    FAMILY HISTORY:  No family history on file.  CURRENT MEDICATIONS:  Current Outpatient Medications  Medication Sig Dispense Refill  . allopurinol (ZYLOPRIM) 300 MG tablet Take 1 tablet (300 mg total) by mouth daily. 30 tablet 1  . ascorbic acid (QC VITAMIN C) 500 MG tablet daily    . Aspirin 81 MG CAPS Take by mouth.    . B Complex Vitamins (RA B-COMPLEX) TABS     . carbidopa-levodopa (SINEMET IR) 25-100 MG tablet Take by mouth.    . Cholecalciferol 50 MCG (2000 UT) CAPS     . dasatinib (SPRYCEL) 70 MG tablet Take 1 tablet (70 mg total) by mouth daily. 30 tablet 1  .  donepezil (ARICEPT) 5 MG tablet     . gemfibrozil (LOPID) 600 MG tablet Take by mouth.    Marland Kitchen ibuprofen (ADVIL) 200 MG tablet Take by mouth.    . latanoprost (XALATAN) 0.005 % ophthalmic solution     . lisinopril (ZESTRIL) 20 MG tablet     . Multiple Vitamins-Minerals (CENTRUM SILVER ADULT 50+) TABS     . tamsulosin (FLOMAX) 0.4 MG CAPS capsule Take by mouth.    Marland Kitchen VITAMIN E PO daily     No current facility-administered medications for this visit.    ALLERGIES:  Allergies  Allergen Reactions  . Penicillins Rash    PHYSICAL EXAM:  Performance status (ECOG): 1 - Symptomatic but completely ambulatory  Vitals:   08/13/20 0822 08/13/20 0900  BP: (!) 146/67 (!) 120/53  Pulse: 86 79  Resp: 18 17  SpO2: 97% 95%   Wt Readings from Last 3 Encounters:   08/05/20 165 lb 12.8 oz (75.2 kg)  07/25/20 163 lb 11.2 oz (74.3 kg)   Physical Exam Vitals reviewed.  Constitutional:      Appearance: Normal appearance.  Neurological:     General: No focal deficit present.     Mental Status: He is alert and oriented to person, place, and time.  Psychiatric:        Mood and Affect: Mood normal.        Behavior: Behavior normal.     LABORATORY DATA:   I have reviewed the labs as listed.  CBC Latest Ref Rng & Units 08/13/2020 07/25/2020  WBC 4.0 - 10.5 K/uL 170.3(HH) 144.8(HH)  Hemoglobin 13.0 - 17.0 g/dL 13.4 13.3  Hematocrit 39 - 52 % 40.7 41.1  Platelets 150 - 400 K/uL 125(L) 151   CMP Latest Ref Rng & Units 07/25/2020  Glucose 70 - 99 mg/dL 176(H)  BUN 8 - 23 mg/dL 20  Creatinine 0.61 - 1.24 mg/dL 1.09  Sodium 135 - 145 mmol/L 137  Potassium 3.5 - 5.1 mmol/L 4.0  Chloride 98 - 111 mmol/L 104  CO2 22 - 32 mmol/L 24  Calcium 8.9 - 10.3 mg/dL 9.3  Total Protein 6.5 - 8.1 g/dL 6.5  Total Bilirubin 0.3 - 1.2 mg/dL 0.6  Alkaline Phos 38 - 126 U/L 64  AST 15 - 41 U/L 23  ALT 0 - 44 U/L 9      Component Value Date/Time   RBC 3.92 (L) 08/13/2020 0807   MCV 103.8 (H) 08/13/2020 0807   MCH 34.2 (H) 08/13/2020 0807   MCHC 32.9 08/13/2020 0807   RDW 17.5 (H) 08/13/2020 0807   LYMPHSABS 10.2 (H) 08/13/2020 0807   MONOABS 10.2 (H) 08/13/2020 0807   EOSABS 8.5 (H) 08/13/2020 0807   BASOSABS 5.1 (H) 08/13/2020 0807   Lab Results  Component Value Date   LDH 634 (H) 07/25/2020    DIAGNOSTIC IMAGING:  I have independently reviewed the scans and discussed with the patient. No results found.   ASSESSMENT:  1.  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 is evaluated at the request of Dr. Lonia Mad for hyperleukocytosis. -CBC on 07/23/2020 shows white count 148.3 with differential showing neutrophils, bands, promyelocytes, myelocytes and occasional blasts.  Platelet count was 138 and hemoglobin 12.9. -CBC on 03/05/2020 shows white count 15.2 with  hemoglobin 15.4 and normal platelet count. -CBC in November 2020 showed white count 10.3 with normal hemoglobin and platelets. -His wife noted 10 pound weight loss in the last 6 months. -Patient reports easy bruising in the last 6  months, mainly on the upper extremities.  Denies any fevers or night sweats.  No recent infections or hospitalizations. -Last hospitalization was approximately 3 to 4 years ago for TIA. -Dasatinib 70 mg daily started on 08/13/2020.  2.  Social/family history: -He lives at home with his wife.  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 -Mother died of cancer, type not known to the patient.   PLAN:  1.  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 -We will proceed with the biopsy today. We will obtain bone marrow morphology and cytogenetics as baseline. -He already received her shipment of her dasatinib 70 mg. -Reviewed blood work from today which showed white count increased to 170. Hemoglobin 13.4 and platelet count is 125. -Will reevaluate him next week to check for tumor lysis labs and CBC.  2.  Easy bruising: -VW D screen checked in [place] was negative. PT, PTT and fibrinogen was normal.  3.  Hyperuricemia: -Uric acid today 6.1. Continue allopurinol 300 mg daily. -We'll check your labs his labs next week.  Orders placed this encounter:  Orders Placed This Encounter  Procedures  . CBC with Differential/Platelet  . Comprehensive metabolic panel  . Magnesium  . Phosphorus  . Uric acid     Derek Jack, MD [place] [phone]   I, Milinda Antis, am acting as a scribe for Dr. Sanda Linger.  I, Derek Jack MD, have reviewed the above documentation for accuracy and completeness, and I agree with the above.

## 2020-08-13 NOTE — Telephone Encounter (Signed)
Oral Oncology Patient Advocate Encounter  Received a voicemail from [employer] Access [employer] rep needing the reason patient is applying for assistance.  I called [employer] back and informed them that the copay was very high and unaffordable.  Rep documented the reason and sent it to the case worker.  [place] Patient [place] Phone [phone] Fax [phone] 08/13/2020 4:01 PM

## 2020-08-13 NOTE — Progress Notes (Signed)
INDICATION: Chronic myeloid leukemia   Bone Marrow Biopsy and Aspiration Procedure Note   The patient was identified by name and date of birth, prior to start of the procedure and a timeout was performed.   An informed consent was obtained after discussing potential risks including bleeding, infection and pain.  The right posterior iliac crest was palpated, cleaned with ChloraPrep, and drapes applied.  1% lidocaine is infiltrated into the skin, subcutaneous tissue and periosteum.  Bone marrow was aspirated and smears made.  With the help of Jamshidi needle a core biopsy was obtained.  Pressure was applied to the biopsy site and bandage was placed over the biopsy site. Patient was made to lie on the back for 15 mins prior to discharge.  The procedure was tolerated well. COMPLICATIONS: None BLOOD LOSS: none Patient was discharged home in stable condition to return in 2 weeks to review results.  Patient was provided with post bone marrow biopsy instructions and instructed to call if there was any bleeding or worsening pain.  Specimens sent for flow cytometry, cytogenetics and additional studies.  Signed Derek Jack, MD

## 2020-08-13 NOTE — Telephone Encounter (Signed)
Oral Oncology Patient Advocate Encounter  Emailed patient application to complete for [facility] Patient Assistance in an effort to reduce patient's out of pocket expense for Sprycel to $0.    Application completed and faxed to [phone] on 08/08/20. (Patient faxed his portion to [facility])  [place] phone number for follow up is [phone].   This encounter will be updated until final determination.   [place] Patient [place] Phone [phone] Fax [phone] 08/13/2020 3:59 PM

## 2020-08-13 NOTE — Progress Notes (Signed)
Patient here today for bone marrow biopsy. Procedure explained and consent signed by all parties at [phone]. Patient placed in prone position with both arms above head 0836. Time out conducted at [phone] all parties agreed. Procedure started at 0843. Patient tolerated procedure well with minimal pain and discomfort. Specimens collected and labeled appropriately. Procedure completed at 0856. Dressing applied and patient reposition on back, sitting up, resting at 0858. Specimens taken to lab for processing. Patient stable and discharged home 0919.

## 2020-08-14 ENCOUNTER — Other Ambulatory Visit (HOSPITAL_COMMUNITY): Payer: Self-pay

## 2020-08-14 MED ORDER — PROCHLORPERAZINE MALEATE 10 MG PO TABS: 10.0000 mg | ORAL_TABLET | Freq: Four times a day (QID) | ORAL | 0 refills | Status: DC | PRN

## 2020-08-14 NOTE — Telephone Encounter (Signed)
Received a voicemail from [employer] that grant funding was open through [employer], and that the patient would need to apply and exhaust funds to be referred to the patient assistance foundation.  I went online to start application with [employer] and they were "at capacity" for copay assistance and the only option was to join the waitlist.    I call [employer] back this morning to inform them of the above information and a message to the access support rep letting them know that funding could not be obtained.  [place] Patient [place] Phone [phone] Fax [phone] 08/14/2020 11:40 AM

## 2020-08-15 ENCOUNTER — Other Ambulatory Visit (HOSPITAL_COMMUNITY): Payer: Self-pay

## 2020-08-15 DIAGNOSIS — C9210 Chronic myeloid leukemia, BCR/ABL-positive, not having achieved remission: Secondary | ICD-10-CM

## 2020-08-15 LAB — SURGICAL PATHOLOGY

## 2020-08-15 MED ORDER — DASATINIB 70 MG PO TABS: 70.0000 mg | ORAL_TABLET | Freq: Every day | ORAL | 1 refills | Status: DC

## 2020-08-15 NOTE — Telephone Encounter (Signed)
Claiborne Billings from [employer] called and left a voicemail that Mr Bakos has been referred to [place] [facility] [employer].  We should receive a determination within the next 24-48 hours.  [place] Patient [place] Phone [phone] Fax [phone] 08/15/2020 1:56 PM

## 2020-08-15 NOTE — Telephone Encounter (Signed)
Prescription for Sprycel has been faxed to [employer].

## 2020-08-17 ENCOUNTER — Other Ambulatory Visit (HOSPITAL_COMMUNITY): Payer: Self-pay | Admitting: Hematology

## 2020-08-17 DIAGNOSIS — D72829 Elevated white blood cell count, unspecified: Secondary | ICD-10-CM

## 2020-08-18 LAB — BCR-ABL1, CML/ALL, PCR, QUANT
E1A2 Transcript: 0.0418 %
Interpretation (BCRAL):: POSITIVE
b2a2 transcript: 4.5149 %
b3a2 transcript: 86.9332 %

## 2020-08-20 ENCOUNTER — Encounter (HOSPITAL_COMMUNITY): Payer: Self-pay | Admitting: Hematology

## 2020-08-20 NOTE — Telephone Encounter (Signed)
Oral Oncology Patient Advocate Encounter  Called [employer] to check the status of Sprycel application.  Rep I spoke with verified missing information (prescription sent 08/15/20).  She sent application for final determination to [place].  Loma Sousa will decide final determination.  Patient may need to submit all pharmacy receipts if not approved.  Determination will be made by the end of the day and we will receive by fax and phone.  [place] Patient [place] Phone [phone] Fax [phone] 08/20/2020 1:54 PM

## 2020-08-21 NOTE — Telephone Encounter (Signed)
Oral Oncology Patient Advocate Encounter  Received notification from [place] Patient [facility] that patient has been successfully enrolled into their program to receive Sprycel from the manufacturer at $0 out of pocket until  10/18/20.    I called and spoke with patient.  He knows we will have to re-apply.   Specialty Pharmacy that will dispense medication is [facility].  Patient knows to call the office with questions or concerns.   Oral Oncology Clinic will continue to follow.  [place] Patient [place] Phone [phone] Fax [phone] 08/21/2020 10:00 AM

## 2020-08-22 ENCOUNTER — Encounter (HOSPITAL_COMMUNITY): Payer: Self-pay | Admitting: Hematology

## 2020-08-22 ENCOUNTER — Other Ambulatory Visit: Payer: Self-pay

## 2020-08-22 ENCOUNTER — Inpatient Hospital Stay (HOSPITAL_COMMUNITY): Payer: Medicare Other | Attending: Hematology | Admitting: Hematology

## 2020-08-22 ENCOUNTER — Inpatient Hospital Stay (HOSPITAL_COMMUNITY): Payer: Medicare Other

## 2020-08-22 VITALS — BP 138/59 | HR 75 | Temp 97.2°F | Resp 18 | Wt 165.8 lb

## 2020-08-22 DIAGNOSIS — Z809 Family history of malignant neoplasm, unspecified: Secondary | ICD-10-CM | POA: Insufficient documentation

## 2020-08-22 DIAGNOSIS — Z79899 Other long term (current) drug therapy: Secondary | ICD-10-CM | POA: Diagnosis not present

## 2020-08-22 DIAGNOSIS — C9210 Chronic myeloid leukemia, BCR/ABL-positive, not having achieved remission: Secondary | ICD-10-CM | POA: Diagnosis present

## 2020-08-22 DIAGNOSIS — G20 Parkinson's disease: Secondary | ICD-10-CM | POA: Insufficient documentation

## 2020-08-22 DIAGNOSIS — R3912 Poor urinary stream: Secondary | ICD-10-CM | POA: Insufficient documentation

## 2020-08-22 DIAGNOSIS — I313 Pericardial effusion (noninflammatory): Secondary | ICD-10-CM | POA: Insufficient documentation

## 2020-08-22 DIAGNOSIS — R609 Edema, unspecified: Secondary | ICD-10-CM | POA: Insufficient documentation

## 2020-08-22 DIAGNOSIS — R233 Spontaneous ecchymoses: Secondary | ICD-10-CM | POA: Insufficient documentation

## 2020-08-22 DIAGNOSIS — Z9181 History of falling: Secondary | ICD-10-CM | POA: Insufficient documentation

## 2020-08-22 DIAGNOSIS — I509 Heart failure, unspecified: Secondary | ICD-10-CM | POA: Diagnosis not present

## 2020-08-22 LAB — CBC WITH DIFFERENTIAL/PLATELET
Band Neutrophils: 6 %
Basophils Absolute: 0 10*3/uL (ref 0.0–0.1)
Basophils Relative: 0 %
Blasts: 1 %
Eosinophils Absolute: 0.9 10*3/uL — ABNORMAL HIGH (ref 0.0–0.5)
Eosinophils Relative: 1 %
HCT: 37.9 % — ABNORMAL LOW (ref 39.0–52.0)
Hemoglobin: 12.2 g/dL — ABNORMAL LOW (ref 13.0–17.0)
Lymphocytes Relative: 2 %
Lymphs Abs: 1.7 10*3/uL (ref 0.7–4.0)
MCH: 33.7 pg (ref 26.0–34.0)
MCHC: 32.2 g/dL (ref 30.0–36.0)
MCV: 104.7 fL — ABNORMAL HIGH (ref 80.0–100.0)
Metamyelocytes Relative: 12 %
Monocytes Absolute: 3.4 10*3/uL — ABNORMAL HIGH (ref 0.1–1.0)
Monocytes Relative: 4 %
Myelocytes: 13 %
Neutro Abs: 54.1 10*3/uL — ABNORMAL HIGH (ref 1.7–7.7)
Neutrophils Relative %: 57 %
Platelets: 112 10*3/uL — ABNORMAL LOW (ref 150–400)
Promyelocytes Relative: 4 %
RBC: 3.62 MIL/uL — ABNORMAL LOW (ref 4.22–5.81)
RDW: 17.2 % — ABNORMAL HIGH (ref 11.5–15.5)
WBC: 85.8 10*3/uL (ref 4.0–10.5)
nRBC: 0.2 % (ref 0.0–0.2)

## 2020-08-22 LAB — PHOSPHORUS: Phosphorus: 4 mg/dL (ref 2.5–4.6)

## 2020-08-22 LAB — COMPREHENSIVE METABOLIC PANEL
ALT: 11 U/L (ref 0–44)
AST: 22 U/L (ref 15–41)
Albumin: 4 g/dL (ref 3.5–5.0)
Alkaline Phosphatase: 74 U/L (ref 38–126)
Anion gap: 7 (ref 5–15)
BUN: 18 mg/dL (ref 8–23)
CO2: 24 mmol/L (ref 22–32)
Calcium: 9.3 mg/dL (ref 8.9–10.3)
Chloride: 105 mmol/L (ref 98–111)
Creatinine, Ser: 0.9 mg/dL (ref 0.61–1.24)
GFR, Estimated: >60 mL/min (ref >60)
Glucose, Bld: 147 mg/dL — ABNORMAL HIGH (ref 70–99)
Potassium: 4.2 mmol/L (ref 3.5–5.1)
Sodium: 136 mmol/L (ref 135–145)
Total Bilirubin: 0.7 mg/dL (ref 0.3–1.2)
Total Protein: 6.9 g/dL (ref 6.5–8.1)

## 2020-08-22 LAB — MAGNESIUM: Magnesium: 2.3 mg/dL (ref 1.7–2.4)

## 2020-08-22 LAB — URIC ACID: Uric Acid, Serum: 5.4 mg/dL (ref 3.7–8.6)

## 2020-08-22 NOTE — Progress Notes (Signed)
Critical value alert:   WBC 85.8  Dr. Delton Coombes aware, no orders received.

## 2020-08-22 NOTE — Patient Instructions (Signed)
[place] at [facility] Discharge Instructions  You were seen today by Dr. Delton Coombes. He went over your recent results. Start taking 2 capsules of tamsulosin at night and see if your urinary symptoms improve. Start taking 1/2 tablet of donepezil daily. Dr. Delton Coombes will see you back in 3 weeks for labs and follow up.   Thank you for choosing [place] at [facility] to provide your oncology and hematology care.  To afford each patient quality time with our provider, please arrive at least 15 minutes before your scheduled appointment time.   If you have a lab appointment with the [facility] please come in thru the Main Entrance and check in at the main information desk  You need to re-schedule your appointment should you arrive 10 or more minutes late.  We strive to give you quality time with our providers, and arriving late affects you and other patients whose appointments are after yours.  Also, if you no show three or more times for appointments you may be dismissed from the clinic at the providers discretion.     Again, thank you for choosing [facility].  Our hope is that these requests will decrease the amount of time that you wait before being seen by our physicians.       _____________________________________________________________  Should you have questions after your visit to [facility], please contact our office at [phone] between the hours of 8:00 a.m. and 4:30 p.m.  Voicemails left after 4:00 p.m. will not be returned until the following business day.  For prescription refill requests, have your pharmacy contact our office and allow 72 hours.    Cancer Center Support Programs:   > Cancer Support Group  2nd Tuesday of the month 1pm-2pm, Journey Room

## 2020-08-22 NOTE — Progress Notes (Signed)
[place], [place] [address]   CLINIC:  Medical Oncology/Hematology  PCP:  Lonia Mad, MD No address on file  None  REASON FOR VISIT:  Follow-up for CML  PRIOR THERAPY: None  CURRENT THERAPY: Dasatinib 70 mg daily  INTERVAL HISTORY:  Kevin Love, a 74 y.o. 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 Kevin Love was last seen on 08/05/2020.  Today he is accompanied by his wife and he reports feeling well. He started taking dasatinib on 10/26 and he is taking allopurinol as well. His wife noticed that his Parkinson's in his right hand has slightly worsened, especially when he gets excited or startled, since starting dasatinib. He has been taking Aricept for many years and it is maintaining his memory. He reports having a weakened stream when he urinates. He takes Flomax every night. He denies palpitations, arrhythmias or MI's.   REVIEW OF SYSTEMS:  Review of Systems  Constitutional: Positive for fatigue (50%). Negative for appetite change.  Cardiovascular: Negative for palpitations.  Genitourinary: Positive for difficulty urinating (decreased flow) and frequency.   Neurological:       Tremor in R hand  All other systems reviewed and are negative.   PAST MEDICAL/SURGICAL HISTORY:  No past medical history on file. No past surgical history on file.  SOCIAL HISTORY:  Social History   Socioeconomic History  . Marital status: Married    Spouse name: Not on file  . Number of children: Not on file  . Years of education: Not on file  . Highest education level: Not on file  Occupational History  . Not on file  Tobacco Use  . Smoking status: Never Smoker  . Smokeless tobacco: Never Used  Substance and Sexual Activity  . Alcohol use: Never  . Drug use: Never  . Sexual activity: Not Currently  Other Topics Concern  . Not on file  Social History Narrative  . Not on file   Social Determinants of Health   Financial Resource  Strain: Low Risk   . Difficulty of Paying Living Expenses: Not hard at all  Food Insecurity: No Food Insecurity  . Worried About Charity fundraiser in the Last Year: Never true  . Ran Out of Food in the Last Year: Never true  Transportation Needs: No Transportation Needs  . Lack of Transportation (Medical): No  . Lack of Transportation (Non-Medical): No  Physical Activity: Insufficiently Active  . Days of Exercise per Week: 7 days  . Minutes of Exercise per Session: 10 min  Stress: No Stress Concern Present  . Feeling of Stress : Not at all  Social Connections: Moderately Integrated  . Frequency of Communication with Friends and Family: Three times a week  . Frequency of Social Gatherings with Friends and Family: Once a week  . Attends Religious Services: More than 4 times per year  . Active Member of Clubs or Organizations: No  . Attends Archivist Meetings: Never  . Marital Status: Married  Human resources officer Violence: Not At Risk  . Fear of Current or Ex-Partner: No  . Emotionally Abused: No  . Physically Abused: No  . Sexually Abused: No    FAMILY HISTORY:  No family history on file.  CURRENT MEDICATIONS:  Current Outpatient Medications  Medication Sig Dispense Refill  . allopurinol (ZYLOPRIM) 300 MG tablet TAKE 1 TABLET BY MOUTH EVERY DAY 30 tablet 1  . ascorbic acid (QC VITAMIN C) 500 MG tablet daily    .  Aspirin 81 MG CAPS Take by mouth.    . B Complex Vitamins (RA B-COMPLEX) TABS     . carbidopa-levodopa (SINEMET IR) 25-100 MG tablet Take by mouth.    . Cholecalciferol 50 MCG (2000 UT) CAPS     . dasatinib (SPRYCEL) 70 MG tablet Take 1 tablet (70 mg total) by mouth daily. 30 tablet 1  . donepezil (ARICEPT) 5 MG tablet     . gemfibrozil (LOPID) 600 MG tablet Take by mouth.    Marland Kitchen ibuprofen (ADVIL) 200 MG tablet Take by mouth.    . latanoprost (XALATAN) 0.005 % ophthalmic solution     . lisinopril (ZESTRIL) 20 MG tablet     . Multiple Vitamins-Minerals  (CENTRUM SILVER ADULT 50+) TABS     . prochlorperazine (COMPAZINE) 10 MG tablet Take 1 tablet (10 mg total) by mouth every 6 (six) hours as needed for nausea or vomiting. 30 tablet 0  . tamsulosin (FLOMAX) 0.4 MG CAPS capsule Take by mouth.    . vitamin E 28000 units external oil Apply topically daily.     No current facility-administered medications for this visit.    ALLERGIES:  Allergies  Allergen Reactions  . Penicillins Rash    PHYSICAL EXAM:  Performance status (ECOG): 1 - Symptomatic but completely ambulatory  Vitals:   08/22/20 1527  BP: (!) 138/59  Pulse: 75  Resp: 18  Temp: (!) 97.2 F (36.2 C)  SpO2: 99%   Wt Readings from Last 3 Encounters:  08/22/20 165 lb 12.8 oz (75.2 kg)  08/05/20 165 lb 12.8 oz (75.2 kg)  07/25/20 163 lb 11.2 oz (74.3 kg)   Physical Exam Vitals reviewed.  Constitutional:      Appearance: Normal appearance.  Cardiovascular:     Rate and Rhythm: Normal rate and regular rhythm.     Pulses: Normal pulses.     Heart sounds: Normal heart sounds.  Pulmonary:     Effort: Pulmonary effort is normal.     Breath sounds: Normal breath sounds.  Abdominal:     Palpations: Abdomen is soft. There is no hepatomegaly, splenomegaly or mass.     Tenderness: There is no abdominal tenderness.     Hernia: No hernia is present.  Musculoskeletal:     Right lower leg: Edema (trace) present.     Left lower leg: Edema (trace) present.  Neurological:     General: No focal deficit present.     Mental Status: He is alert and oriented to person, place, and time.  Psychiatric:        Mood and Affect: Mood normal.        Behavior: Behavior normal.     LABORATORY DATA:  I have reviewed the labs as listed.  CBC Latest Ref Rng & Units 08/22/2020 08/13/2020 07/25/2020  WBC 4.0 - 10.5 K/uL 85.8(HH) 170.3(HH) 144.8(HH)  Hemoglobin 13.0 - 17.0 g/dL 12.2(L) 13.4 13.3  Hematocrit 39 - 52 % 37.9(L) 40.7 41.1  Platelets 150 - 400 K/uL 112(L) 125(L) 151   CMP  Latest Ref Rng & Units 08/22/2020 07/25/2020  Glucose 70 - 99 mg/dL 147(H) 176(H)  BUN 8 - 23 mg/dL 18 20  Creatinine 0.61 - 1.24 mg/dL 0.90 1.09  Sodium 135 - 145 mmol/L 136 137  Potassium 3.5 - 5.1 mmol/L 4.2 4.0  Chloride 98 - 111 mmol/L 105 104  CO2 22 - 32 mmol/L 24 24  Calcium 8.9 - 10.3 mg/dL 9.3 9.3  Total Protein 6.5 - 8.1 g/dL 6.9 6.5  Total Bilirubin 0.3 - 1.2 mg/dL 0.7 0.6  Alkaline Phos 38 - 126 U/L 74 64  AST 15 - 41 U/L 22 23  ALT 0 - 44 U/L 11 9      Component Value Date/Time   RBC 3.62 (L) 08/22/2020 1410   MCV 104.7 (H) 08/22/2020 1410   MCH 33.7 08/22/2020 1410   MCHC 32.2 08/22/2020 1410   RDW 17.2 (H) 08/22/2020 1410   LYMPHSABS 1.7 08/22/2020 1410   MONOABS 3.4 (H) 08/22/2020 1410   EOSABS 0.9 (H) 08/22/2020 1410   BASOSABS 0.0 08/22/2020 1410    DIAGNOSTIC IMAGING:  I have independently reviewed the scans and discussed with the patient. No results found.   ASSESSMENT:  1.  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 Hodgesis evaluated at the request of Dr. Marvis Moeller hyperleukocytosis. -CBC on 07/23/2020 shows white count 148.3 with differential showing neutrophils, bands, promyelocytes, myelocytes and occasional blasts. Platelet count was 138 and hemoglobin 12.9. -CBC on 03/05/2020 shows white count 15.2 with hemoglobin 15.4 and normal platelet count. -CBC in November 2020 showed white count 10.3 with normal hemoglobin and platelets. -His wife noted 10 pound weight loss in the last 6 months. -Patient reports easy bruising in the last 6 months, mainly on the upper extremities. Denies any fevers or night sweats. No recent infections or hospitalizations. -Last hospitalization was approximately 3 to 4 years ago for TIA. -Dasatinib 70 mg daily started on 08/13/2020. -Bone marrow biopsy on 08/13/2020 with karyotype 38, XY,t(9;22), hypercellular bone marrow with morphological features consistent with CML.  2. Social/family history: -He lives at home with his  wife. 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 He quit smoking in December 07, 1988. -Mother died of cancer, type not known to the patient.   PLAN:  1.  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  No other dysplasia. -He started taking dasatinib on 08/13/2020.  Denied any GI symptoms. -Reviewed labs from today which showed white count improved to 85 from 170.  Hemoglobin is 12.2 and platelet count is 111. -He reported slight worsening of shaking of his hands from Parkinson's.  I reviewed drug interactions with carbidopa and levodopa and dasatinib.  No interactions noted. -However dasatinib and Aricept can cause QT prolongation.  Patient never had history of arrhythmias. -I will cut back on Aricept to half tablet daily. -He also developed trace edema of the ankles.  We will keep a close eye on it as dasatinib can cause fluid retention.  If there is worsening, will consider Lasix. -RTC 3 weeks with repeat labs.  2. Easy bruising: -He does not report any easy bruising or bleeding at this time.  Previous work-up including von Willebrand panel was negative.  3.  TLS prophylaxis: -Uric acid is 5.4, phosphate is 4 and magnesium 2.3.  Potassium 4.2. -Continue allopurinol 300 mg daily until white count normalizes.  4.  Difficulty urination: -He is currently on Flomax 0.4 mg.  He reports some difficulty urinating. -I will increase Flomax to 0.8 mg.  If it helps, will give prescription for increased dose.  Orders placed this encounter:  No orders of the defined types were placed in this encounter.    Derek Jack, MD [place] [phone]   I, Milinda Antis, am acting as a scribe for Dr. Sanda Linger.  I, Derek Jack MD, have reviewed the above documentation for accuracy and completeness, and I agree with the above.

## 2020-08-23 NOTE — Telephone Encounter (Signed)
Spoke to Kevin Love today.  He spoke with [employer] about the approval received first fill from them on 08/21/20.  Patient also stated that he was informed that he was approved for assistance until 09/2021.  He will verify this when he receives his approval letter.  [place] Patient [place] Phone [phone] Fax [phone] 08/23/2020 2:25 PM

## 2020-08-26 ENCOUNTER — Ambulatory Visit (HOSPITAL_COMMUNITY): Payer: Medicare Other | Admitting: Hematology

## 2020-09-11 ENCOUNTER — Inpatient Hospital Stay (HOSPITAL_BASED_OUTPATIENT_CLINIC_OR_DEPARTMENT_OTHER): Payer: Medicare Other | Admitting: Hematology

## 2020-09-11 ENCOUNTER — Inpatient Hospital Stay (HOSPITAL_COMMUNITY): Payer: Medicare Other

## 2020-09-11 ENCOUNTER — Other Ambulatory Visit: Payer: Self-pay

## 2020-09-11 VITALS — BP 146/62 | HR 76 | Temp 97.3°F | Resp 18 | Wt 172.2 lb

## 2020-09-11 DIAGNOSIS — C9210 Chronic myeloid leukemia, BCR/ABL-positive, not having achieved remission: Secondary | ICD-10-CM | POA: Diagnosis not present

## 2020-09-11 LAB — CBC WITH DIFFERENTIAL/PLATELET
Abs Immature Granulocytes: 0.02 10*3/uL (ref 0.00–0.07)
Basophils Absolute: 0.1 10*3/uL (ref 0.0–0.1)
Basophils Relative: 2 %
Eosinophils Absolute: 0.3 10*3/uL (ref 0.0–0.5)
Eosinophils Relative: 6 %
HCT: 32 % — ABNORMAL LOW (ref 39.0–52.0)
Hemoglobin: 10.1 g/dL — ABNORMAL LOW (ref 13.0–17.0)
Immature Granulocytes: 1 %
Lymphocytes Relative: 24 %
Lymphs Abs: 1 10*3/uL (ref 0.7–4.0)
MCH: 33.4 pg (ref 26.0–34.0)
MCHC: 31.6 g/dL (ref 30.0–36.0)
MCV: 106 fL — ABNORMAL HIGH (ref 80.0–100.0)
Monocytes Absolute: 0.4 10*3/uL (ref 0.1–1.0)
Monocytes Relative: 11 %
Neutro Abs: 2.3 10*3/uL (ref 1.7–7.7)
Neutrophils Relative %: 56 %
Platelets: 152 10*3/uL (ref 150–400)
RBC: 3.02 MIL/uL — ABNORMAL LOW (ref 4.22–5.81)
RDW: 17.5 % — ABNORMAL HIGH (ref 11.5–15.5)
WBC: 4.1 10*3/uL (ref 4.0–10.5)
nRBC: 0 % (ref 0.0–0.2)

## 2020-09-11 LAB — COMPREHENSIVE METABOLIC PANEL
ALT: 10 U/L (ref 0–44)
AST: 20 U/L (ref 15–41)
Albumin: 3.7 g/dL (ref 3.5–5.0)
Alkaline Phosphatase: 91 U/L (ref 38–126)
Anion gap: 7 (ref 5–15)
BUN: 17 mg/dL (ref 8–23)
CO2: 24 mmol/L (ref 22–32)
Calcium: 8.8 mg/dL — ABNORMAL LOW (ref 8.9–10.3)
Chloride: 107 mmol/L (ref 98–111)
Creatinine, Ser: 0.85 mg/dL (ref 0.61–1.24)
GFR, Estimated: >60 mL/min (ref >60)
Glucose, Bld: 118 mg/dL — ABNORMAL HIGH (ref 70–99)
Potassium: 4 mmol/L (ref 3.5–5.1)
Sodium: 138 mmol/L (ref 135–145)
Total Bilirubin: 0.5 mg/dL (ref 0.3–1.2)
Total Protein: 6.3 g/dL — ABNORMAL LOW (ref 6.5–8.1)

## 2020-09-11 LAB — LACTATE DEHYDROGENASE: LDH: 141 U/L (ref 98–192)

## 2020-09-11 LAB — PHOSPHORUS: Phosphorus: 3.3 mg/dL (ref 2.5–4.6)

## 2020-09-11 LAB — MAGNESIUM: Magnesium: 2.3 mg/dL (ref 1.7–2.4)

## 2020-09-11 LAB — URIC ACID: Uric Acid, Serum: 5 mg/dL (ref 3.7–8.6)

## 2020-09-11 MED ORDER — DASATINIB 20 MG PO TABS: 20.0000 mg | ORAL_TABLET | Freq: Every day | ORAL | 1 refills | Status: DC

## 2020-09-11 NOTE — Progress Notes (Signed)
Kevin Love, Kevin Love [id number]   CLINIC:  Medical Oncology/Hematology  PCP:  Lonia Mad, MD No address on file  None  REASON FOR VISIT:  Follow-up for CML  PRIOR THERAPY: None  CURRENT THERAPY: Dasatinib 70 mg daily  INTERVAL HISTORY:  Kevin Love, a 74 y.o. 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 Kevin Love was last seen on 08/22/2020.  Today he is accompanied by his wife and he reports feeling okay. He went to see Dr. Calton Dach on 11/23 after having cough, wheezing, ankle swelling and orthopnea for the past 1.5 weeks. Echo and CXR were performed which showed mild CHF. He started taking Lasix 40 mg after being diagnosed and was told to stop dasatinib; his orthopnea has improved since starting Lasix. His urinary stream is good and he continues taking Flomax 1 tablet daily. He reports falling recently after descending down the stairs and missing the last step.  He will see Dr. Calton Dach on 12/7.   REVIEW OF SYSTEMS:  Review of Systems  Constitutional: Positive for appetite change (75%) and fatigue (50%).  Respiratory: Positive for shortness of breath (orthopnea) and wheezing.   Cardiovascular: Positive for leg swelling (ankle swelling).  Neurological: Positive for dizziness.  All other systems reviewed and are negative.   PAST MEDICAL/SURGICAL HISTORY:  No past medical history on file. No past surgical history on file.  SOCIAL HISTORY:  Social History   Socioeconomic History   Marital status: Married    Spouse name: Not on file   Number of children: Not on file   Years of education: Not on file   Highest education level: Not on file  Occupational History   Not on file  Tobacco Use   Smoking status: Never Smoker   Smokeless tobacco: Never Used  Substance and Sexual Activity   Alcohol use: Never   Drug use: Never   Sexual activity: Not Currently  Other Topics Concern   Not on file  Social History  Narrative   Not on file   Social Determinants of Health   Financial Resource Strain: Low Risk    Difficulty of Paying Living Expenses: Not hard at all  Food Insecurity: No Food Insecurity   Worried About Charity fundraiser in the Last Year: Never true   [place] in the Last Year: Never true  Transportation Needs: No Transportation Needs   Lack of Transportation (Medical): No   Lack of Transportation (Non-Medical): No  Physical Activity: Insufficiently Active   Days of Exercise per Week: 7 days   Minutes of Exercise per Session: 10 min  Stress: No Stress Concern Present   Feeling of Stress : Not at all  Social Connections: Moderately Integrated   Frequency of Communication with Friends and Family: Three times a week   Frequency of Social Gatherings with Friends and Family: Once a week   Attends Religious Services: More than 4 times per year   Active Member of [employer] or Organizations: No   Attends Archivist Meetings: Never   Marital Status: Married  Human resources officer Violence: Not At Risk   Fear of Current or Ex-Partner: No   Emotionally Abused: No   Physically Abused: No   Sexually Abused: No    FAMILY HISTORY:  No family history on file.  CURRENT MEDICATIONS:  Current Outpatient Medications  Medication Sig Dispense Refill   allopurinol (ZYLOPRIM) 300 MG tablet TAKE 1 TABLET BY MOUTH EVERY DAY 30 tablet  1   ascorbic acid (QC VITAMIN C) 500 MG tablet daily     Aspirin 81 MG CAPS Take by mouth.     B Complex Vitamins (RA B-COMPLEX) TABS      carbidopa-levodopa (SINEMET IR) 25-100 MG tablet Take by mouth.     Cholecalciferol 50 MCG (2000 UT) CAPS      dasatinib (SPRYCEL) 70 MG tablet Take 1 tablet (70 mg total) by mouth daily. 30 tablet 1   donepezil (ARICEPT) 5 MG tablet      furosemide (LASIX) 40 MG tablet Take 40 mg by mouth daily.     ibuprofen (ADVIL) 200 MG tablet Take by mouth.     Icosapent Ethyl (VASCEPA) 0.5 g CAPS  Take 1 capsule by mouth 2 (two) times daily.     latanoprost (XALATAN) 0.005 % ophthalmic solution      lisinopril (ZESTRIL) 20 MG tablet      Multiple Vitamins-Minerals (CENTRUM SILVER ADULT 50+) TABS      prochlorperazine (COMPAZINE) 10 MG tablet Take 1 tablet (10 mg total) by mouth every 6 (six) hours as needed for nausea or vomiting. 30 tablet 0   tamsulosin (FLOMAX) 0.4 MG CAPS capsule Take by mouth.     vitamin E 28000 units external oil Apply topically daily.     No current facility-administered medications for this visit.    ALLERGIES:  Allergies  Allergen Reactions   Penicillins Rash    PHYSICAL EXAM:  Performance status (ECOG): 1 - Symptomatic but completely ambulatory  Vitals:   09/11/20 0950  BP: (!) 146/62  Pulse: 76  Resp: 18  Temp: (!) 97.3 F (36.3 C)  SpO2: 95%   Wt Readings from Last 3 Encounters:  09/11/20 172 lb 3.2 oz (78.1 kg)  08/22/20 165 lb 12.8 oz (75.2 kg)  08/05/20 165 lb 12.8 oz (75.2 kg)   Physical Exam Vitals reviewed.  Constitutional:      Appearance: Normal appearance.  Cardiovascular:     Rate and Rhythm: Normal rate and regular rhythm.     Pulses: Normal pulses.     Heart sounds: Normal heart sounds.  Pulmonary:     Effort: Pulmonary effort is normal.     Breath sounds: Examination of the right-lower field reveals decreased breath sounds. Decreased breath sounds present.  Musculoskeletal:     Right lower leg: Edema (1+) present.     Left lower leg: Edema (1+) present.  Neurological:     General: No focal deficit present.     Mental Status: He is alert and oriented to person, place, and time.  Psychiatric:        Mood and Affect: Mood normal.        Behavior: Behavior normal.     LABORATORY DATA:  I have reviewed the labs as listed.  CBC Latest Ref Rng & Units 09/11/2020 08/22/2020 08/13/2020  WBC 4.0 - 10.5 K/uL 4.1 85.8(HH) 170.3(HH)  Hemoglobin 13.0 - 17.0 g/dL 10.1(L) 12.2(L) 13.4  Hematocrit 39 - 52 % 32.0(L)  37.9(L) 40.7  Platelets 150 - 400 K/uL 152 112(L) 125(L)   CMP Latest Ref Rng & Units 09/11/2020 08/22/2020 07/25/2020  Glucose 70 - 99 mg/dL 118(H) 147(H) 176(H)  BUN 8 - 23 mg/dL 17 18 20   Creatinine 0.61 - 1.24 mg/dL 0.85 0.90 1.09  Sodium 135 - 145 mmol/L 138 136 137  Potassium 3.5 - 5.1 mmol/L 4.0 4.2 4.0  Chloride 98 - 111 mmol/L 107 105 104  CO2 22 - 32 mmol/L 24  24 24  Calcium 8.9 - 10.3 mg/dL 8.8(L) 9.3 9.3  Total Protein 6.5 - 8.1 g/dL 6.3(L) 6.9 6.5  Total Bilirubin 0.3 - 1.2 mg/dL 0.5 0.7 0.6  Alkaline Phos 38 - 126 U/L 91 74 64  AST 15 - 41 U/L 20 22 23   ALT 0 - 44 U/L 10 11 9       Component Value Date/Time   RBC 3.02 (L) 09/11/2020 0937   MCV 106.0 (H) 09/11/2020 0937   MCH 33.4 09/11/2020 0937   MCHC 31.6 09/11/2020 0937   RDW 17.5 (H) 09/11/2020 0937   LYMPHSABS 1.0 09/11/2020 0937   MONOABS 0.4 09/11/2020 0937   EOSABS 0.3 09/11/2020 0937   BASOSABS 0.1 09/11/2020 0937   Lab Results  Component Value Date   LDH 141 09/11/2020   LDH 634 (H) 07/25/2020    DIAGNOSTIC IMAGING:  I have independently reviewed the scans and discussed with the patient. No results found.   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 Hodgesis evaluated at the request of Dr. Marvis Moeller hyperleukocytosis. -CBC on 07/23/2020 shows white count 148.3 with differential showing neutrophils, bands, promyelocytes, myelocytes and occasional blasts. Platelet count was 138 and hemoglobin 12.9. -CBC on 03/05/2020 shows white count 15.2 with hemoglobin 15.4 and normal platelet count. -CBC in November 2020 showed white count 10.3 with normal hemoglobin and platelets. -His wife noted 10 pound weight loss in the last 6 months. -Patient reports easy bruising in the last 6 months, mainly on the upper extremities. Denies any fevers or night sweats. No recent infections or hospitalizations. -Last hospitalization was approximately 3 to 4 years ago for TIA. -Dasatinib 70 mg daily started on  08/13/2020. -Bone marrow biopsy on 08/13/2020 with karyotype 66, XY,t(9;22), hypercellular bone marrow with morphological features consistent with CML.  2. Social/family history: -He lives at home with his wife. 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 -Mother died of cancer, type not known to the patient.   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 -I have obtained and reviewed echo results from [facility]. -We have reviewed labs from today which shows white count completely normalized at 4.1.  Platelet count is 152.  Hemoglobin 10.1. -We will likely consider decreasing dasatinib dose to 20 mg daily.  I will send a prescription for it. -He will start at lower dose in 2 to 3 weeks after fluid retention subsides, that is around 10/02/2020. -He was given appointment in 6 weeks with repeat labs.  2.  Fluid retention: -He has developed ankle swellings and difficulty breathing.  He also had symptoms of PND and orthopnea. -He was evaluated by Dr.Isernia on 09/09/2020.  His dasatinib was held.  He was started on Lasix. -2D echo on 09/10/2020 at [facility] showed normal LV size and function with ejection fraction 55%.  Mild concentric hypertrophy.  Normal right ventricle size with function.  Mild to moderate MR.  Trivial pericardial effusion.  No pericardial tamponade. -Chest x-ray on 09/10/2020 showed increased irregular reticular markings with patchy bilateral airspace disease and tiny effusions. -He has developed dasatinib induced fluid retention. -His breathing improved.  He still has some leg swelling.  We will continue Lasix at this time.  3.  TLS prophylaxis: -Uric acid is 5.0.  White count normalized at 4.1.  He may discontinue allopurinol.  4.  Difficulty urination: -Continue Flomax 0.4 mg daily.  5.  Parkinson's disease: -Continue  carbidopa and levodopa. -We have cut back on Aricept to 2.5 mg daily due to interaction with dasatinib.  Orders placed this encounter:  No orders of the defined types were placed in this encounter.    Derek Jack, MD [place] [phone]   I, Milinda Antis, am acting as a scribe for Dr. Sanda Linger.  I, Derek Jack MD, have reviewed the above documentation for accuracy and completeness, and I agree with the above.

## 2020-09-11 NOTE — Patient Instructions (Signed)
[place] at [facility] Discharge Instructions  You were seen today by Dr. Delton Coombes. He went over your recent results. Stop taking dasatinib. Dr. Delton Coombes will see you back in 6 weeks for labs and follow up.   Thank you for choosing [place] at [facility] to provide your oncology and hematology care.  To afford each patient quality time with our provider, please arrive at least 15 minutes before your scheduled appointment time.   If you have a lab appointment with the [facility] please come in thru the Main Entrance and check in at the main information desk  You need to re-schedule your appointment should you arrive 10 or more minutes late.  We strive to give you quality time with our providers, and arriving late affects you and other patients whose appointments are after yours.  Also, if you no show three or more times for appointments you may be dismissed from the clinic at the providers discretion.     Again, thank you for choosing [facility].  Our hope is that these requests will decrease the amount of time that you wait before being seen by our physicians.       _____________________________________________________________  Should you have questions after your visit to [facility], please contact our office at [phone] between the hours of 8:00 a.m. and 4:30 p.m.  Voicemails left after 4:00 p.m. will not be returned until the following business day.  For prescription refill requests, have your pharmacy contact our office and allow 72 hours.    Cancer Center Support Programs:   > Cancer Support Group  2nd Tuesday of the month 1pm-2pm, Journey Room

## 2020-09-14 ENCOUNTER — Other Ambulatory Visit (HOSPITAL_COMMUNITY): Payer: Self-pay | Admitting: Hematology

## 2020-09-14 DIAGNOSIS — D72829 Elevated white blood cell count, unspecified: Secondary | ICD-10-CM

## 2020-09-16 ENCOUNTER — Other Ambulatory Visit (HOSPITAL_COMMUNITY): Payer: Self-pay

## 2020-09-16 ENCOUNTER — Encounter (HOSPITAL_COMMUNITY): Payer: Self-pay

## 2020-09-16 DIAGNOSIS — C9210 Chronic myeloid leukemia, BCR/ABL-positive, not having achieved remission: Secondary | ICD-10-CM

## 2020-09-16 MED ORDER — DASATINIB 20 MG PO TABS: 20.0000 mg | ORAL_TABLET | Freq: Every day | ORAL | 1 refills | Status: DC

## 2020-09-16 NOTE — Progress Notes (Signed)
Prescription sent for Sprycel 20mg  by Dr. Delton Coombes. Patient made aware of prescription. Patient instructed to remain off of Sprycel until 10/02/2020 and then resume at 20mg  daily per Dr. Delton Coombes. Patient verbalized understanding.

## 2020-09-17 ENCOUNTER — Other Ambulatory Visit (HOSPITAL_COMMUNITY): Payer: Self-pay

## 2020-09-17 DIAGNOSIS — C9210 Chronic myeloid leukemia, BCR/ABL-positive, not having achieved remission: Secondary | ICD-10-CM

## 2020-09-17 MED ORDER — DASATINIB 20 MG PO TABS: 20.0000 mg | ORAL_TABLET | Freq: Every day | ORAL | 1 refills | Status: DC

## 2020-09-26 ENCOUNTER — Telehealth (HOSPITAL_COMMUNITY): Payer: Self-pay | Admitting: Pharmacy Technician

## 2020-10-22 NOTE — Telephone Encounter (Signed)
Oral Oncology Patient Advocate Encounter  Received communication from [facility] [employer] that the patient's eligibility in the patient assistance program for Sprycel was due for re-enrollment.    Patient faxed in his portion of the application and asked me to fax him the MD portion.  MD portion was completed and faxed on 09/26/20.    The renewal application has been completed and faxed in an effort to keep the patient's out of pocket expense for Sprycel at $0.     Application completed and faxed to [phone].    [employer] [facility] [employer] patient assistance phone number for follow up is [phone].    This encounter will be updated until final determination.  Daine Floras CPHT Specialty Pharmacy Patient Advocate [facility] Phone [phone] Fax [phone]

## 2020-10-22 NOTE — Telephone Encounter (Signed)
Oral Oncology Patient Advocate Encounter  Received notification from [facility] [employer] Patient [facility] that patient has been successfully enrolled into their program to receive Sprycel from the manufacturer at $0 out of pocket until 10/18/21.    I called and spoke with patient.  He knows we will have to re-apply.   Patient knows to call the office with questions or concerns.   Oral Oncology Clinic will continue to follow.  Daine Floras CPHT Specialty Pharmacy Patient Advocate [facility] Phone [phone] Fax [phone] 10/22/2020 9:29 AM

## 2020-10-23 ENCOUNTER — Other Ambulatory Visit: Payer: Self-pay

## 2020-10-23 ENCOUNTER — Inpatient Hospital Stay (HOSPITAL_COMMUNITY): Payer: Medicare Other

## 2020-10-23 ENCOUNTER — Other Ambulatory Visit (HOSPITAL_COMMUNITY): Payer: Self-pay | Admitting: Hematology

## 2020-10-23 ENCOUNTER — Inpatient Hospital Stay (HOSPITAL_COMMUNITY): Payer: Medicare Other | Attending: Hematology | Admitting: Hematology

## 2020-10-23 VITALS — BP 138/65 | HR 75 | Temp 97.2°F | Resp 18 | Wt 160.2 lb

## 2020-10-23 DIAGNOSIS — R39198 Other difficulties with micturition: Secondary | ICD-10-CM | POA: Diagnosis not present

## 2020-10-23 DIAGNOSIS — C9210 Chronic myeloid leukemia, BCR/ABL-positive, not having achieved remission: Secondary | ICD-10-CM | POA: Insufficient documentation

## 2020-10-23 DIAGNOSIS — Z79899 Other long term (current) drug therapy: Secondary | ICD-10-CM | POA: Diagnosis not present

## 2020-10-23 DIAGNOSIS — R609 Edema, unspecified: Secondary | ICD-10-CM | POA: Insufficient documentation

## 2020-10-23 DIAGNOSIS — G20 Parkinson's disease: Secondary | ICD-10-CM | POA: Diagnosis not present

## 2020-10-23 LAB — CBC WITH DIFFERENTIAL/PLATELET
Abs Immature Granulocytes: 0.01 10*3/uL (ref 0.00–0.07)
Basophils Absolute: 0 10*3/uL (ref 0.0–0.1)
Basophils Relative: 1 %
Eosinophils Absolute: 0.2 10*3/uL (ref 0.0–0.5)
Eosinophils Relative: 4 %
HCT: 40.9 % (ref 39.0–52.0)
Hemoglobin: 13.2 g/dL (ref 13.0–17.0)
Immature Granulocytes: 0 %
Lymphocytes Relative: 30 %
Lymphs Abs: 1.7 10*3/uL (ref 0.7–4.0)
MCH: 32.5 pg (ref 26.0–34.0)
MCHC: 32.3 g/dL (ref 30.0–36.0)
MCV: 100.7 fL — ABNORMAL HIGH (ref 80.0–100.0)
Monocytes Absolute: 0.4 10*3/uL (ref 0.1–1.0)
Monocytes Relative: 7 %
Neutro Abs: 3.3 10*3/uL (ref 1.7–7.7)
Neutrophils Relative %: 58 %
Platelets: 129 10*3/uL — ABNORMAL LOW (ref 150–400)
RBC: 4.06 MIL/uL — ABNORMAL LOW (ref 4.22–5.81)
RDW: 14 % (ref 11.5–15.5)
WBC: 5.6 10*3/uL (ref 4.0–10.5)
nRBC: 0 % (ref 0.0–0.2)

## 2020-10-23 LAB — COMPREHENSIVE METABOLIC PANEL
ALT: 6 U/L (ref 0–44)
AST: 20 U/L (ref 15–41)
Albumin: 4.1 g/dL (ref 3.5–5.0)
Alkaline Phosphatase: 76 U/L (ref 38–126)
Anion gap: 8 (ref 5–15)
BUN: 19 mg/dL (ref 8–23)
CO2: 28 mmol/L (ref 22–32)
Calcium: 9.3 mg/dL (ref 8.9–10.3)
Chloride: 101 mmol/L (ref 98–111)
Creatinine, Ser: 0.89 mg/dL (ref 0.61–1.24)
GFR, Estimated: >60 mL/min (ref >60)
Glucose, Bld: 104 mg/dL — ABNORMAL HIGH (ref 70–99)
Potassium: 4.6 mmol/L (ref 3.5–5.1)
Sodium: 137 mmol/L (ref 135–145)
Total Bilirubin: 0.7 mg/dL (ref 0.3–1.2)
Total Protein: 6.8 g/dL (ref 6.5–8.1)

## 2020-10-23 LAB — PHOSPHORUS: Phosphorus: 3.5 mg/dL (ref 2.5–4.6)

## 2020-10-23 LAB — URIC ACID: Uric Acid, Serum: 6.2 mg/dL (ref 3.7–8.6)

## 2020-10-23 LAB — MAGNESIUM: Magnesium: 2.2 mg/dL (ref 1.7–2.4)

## 2020-10-23 NOTE — Progress Notes (Signed)
Kevin Love, Kevin Love   CLINIC:  Medical Oncology/Hematology  PCP:  Lonia Mad, MD No address on file  None  REASON FOR VISIT:  Follow-up for CML  PRIOR THERAPY: None  CURRENT THERAPY: Dasatinib 20 mg daily  INTERVAL HISTORY:  [name], a 75 y.o. 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 Kevin Love was last seen on 09/11/2020.  Today he is accompanied by his wife and he reports feeling well. He started taking dasatinib 20 mg on 10/02/2020 and has been tolerating it well; he denies having any leg swelling. He is also taking Lasix 20 mg QAM. His appetite is excellent and denies having N/V.   REVIEW OF SYSTEMS:  Review of Systems  Constitutional: Positive for fatigue (50%). Negative for appetite change.  Cardiovascular: Negative for leg swelling.  Gastrointestinal: Positive for constipation. Negative for nausea and vomiting.  All other systems reviewed and are negative.   PAST MEDICAL/SURGICAL HISTORY:  No past medical history on file. No past surgical history on file.  SOCIAL HISTORY:  Social History   Socioeconomic History  . Marital status: Married    Spouse name: Not on file  . Number of children: Not on file  . Years of education: Not on file  . Highest education level: Not on file  Occupational History  . Not on file  Tobacco Use  . Smoking status: Never Smoker  . Smokeless tobacco: Never Used  Substance and Sexual Activity  . Alcohol use: Never  . Drug use: Never  . Sexual activity: Not Currently  Other Topics Concern  . Not on file  Social History Narrative  . Not on file   Social Determinants of Health   Financial Resource Strain: Low Risk   . Difficulty of Paying Living Expenses: Not hard at all  Food Insecurity: No Food Insecurity  . Worried About Charity fundraiser in the Last Year: Never true  . Ran Out of Food in the Last Year: Never true  Transportation Needs: No Transportation  Needs  . Lack of Transportation (Medical): No  . Lack of Transportation (Non-Medical): No  Physical Activity: Insufficiently Active  . Days of Exercise per Week: 7 days  . Minutes of Exercise per Session: 10 min  Stress: No Stress Concern Present  . Feeling of Stress : Not at all  Social Connections: Moderately Integrated  . Frequency of Communication with Friends and Family: Three times a week  . Frequency of Social Gatherings with Friends and Family: Once a week  . Attends Religious Services: More than 4 times per year  . Active Member of Clubs or Organizations: No  . Attends Archivist Meetings: Never  . Marital Status: Married  Human resources officer Violence: Not At Risk  . Fear of Current or Ex-Partner: No  . Emotionally Abused: No  . Physically Abused: No  . Sexually Abused: No    FAMILY HISTORY:  No family history on file.  CURRENT MEDICATIONS:  Current Outpatient Medications  Medication Sig Dispense Refill  . allopurinol (ZYLOPRIM) 300 MG tablet TAKE 1 TABLET BY MOUTH EVERY DAY 30 tablet 1  . ascorbic acid (VITAMIN C) 500 MG tablet daily    . Aspirin 81 MG CAPS Take by mouth.    . B Complex Vitamins (RA B-COMPLEX) TABS     . carbidopa-levodopa (SINEMET IR) 25-100 MG tablet Take by mouth.    . Cholecalciferol 50 MCG (2000 UT) CAPS     .  clonazePAM (KLONOPIN) 1 MG tablet clonazepam 1 mg tablet    . donepezil (ARICEPT) 5 MG tablet     . furosemide (LASIX) 40 MG tablet Take 40 mg by mouth daily.    Marland Kitchen ibuprofen (ADVIL) 200 MG tablet Take by mouth.    Vanessa Kick Ethyl (VASCEPA) 0.5 g CAPS Take 1 capsule by mouth 2 (two) times daily.    Marland Kitchen latanoprost (XALATAN) 0.005 % ophthalmic solution     . lisinopril (ZESTRIL) 20 MG tablet     . Multiple Vitamins-Minerals (CENTRUM SILVER ADULT 50+) TABS     . Omeprazole 20 MG TBDD omeprazole 20 mg delayed release,disintegrating tablet  Take by oral route.    . potassium chloride SA (KLOR-CON) 20 MEQ tablet Klor-Con M20 mEq  tablet,extended release    . prochlorperazine (COMPAZINE) 10 MG tablet Take 1 tablet (10 mg total) by mouth every 6 (six) hours as needed for nausea or vomiting. 30 tablet 0  . tamsulosin (FLOMAX) 0.4 MG CAPS capsule Take by mouth.    . vitamin E 28000 units external oil Apply topically daily.    . SPRYCEL 20 MG tablet TAKE 1 TABLET BY MOUTH ONCE DAILY 30 tablet 11   No current facility-administered medications for this visit.    ALLERGIES:  Allergies  Allergen Reactions  . Penicillins Rash    PHYSICAL EXAM:  Performance status (ECOG): 1 - Symptomatic but completely ambulatory  Vitals:   10/23/20 1435  BP: 138/65  Pulse: 75  Resp: 18  Temp: (!) 97.2 F (36.2 C)  SpO2: 97%   Wt Readings from Last 3 Encounters:  10/23/20 160 lb 3.2 oz (72.7 kg)  09/11/20 172 lb 3.2 oz (78.1 kg)  08/22/20 165 lb 12.8 oz (75.2 kg)   Physical Exam Vitals reviewed.  Constitutional:      Appearance: Normal appearance.  Cardiovascular:     Rate and Rhythm: Normal rate and regular rhythm.     Pulses: Normal pulses.     Heart sounds: Normal heart sounds.  Pulmonary:     Effort: Pulmonary effort is normal.     Breath sounds: Normal breath sounds.  Abdominal:     Palpations: Abdomen is soft. There is no hepatomegaly, splenomegaly or mass.     Tenderness: There is no abdominal tenderness.     Hernia: No hernia is present.  Musculoskeletal:     Right lower leg: No edema.     Left lower leg: No edema.  Lymphadenopathy:     Lower Body: No right inguinal adenopathy. No left inguinal adenopathy.  Neurological:     General: No focal deficit present.     Mental Status: He is alert and oriented to person, place, and time.  Psychiatric:        Mood and Affect: Mood normal.        Behavior: Behavior normal.     LABORATORY DATA:  I have reviewed the labs as listed.  CBC Latest Ref Rng & Units 10/23/2020 09/11/2020 08/22/2020  WBC 4.0 - 10.5 K/uL 5.6 4.1 85.8(HH)  Hemoglobin 13.0 - 17.0 g/dL 13.2  10.1(L) 12.2(L)  Hematocrit 39.0 - 52.0 % 40.9 32.0(L) 37.9(L)  Platelets 150 - 400 K/uL 129(L) 152 112(L)   CMP Latest Ref Rng & Units 10/23/2020 09/11/2020 08/22/2020  Glucose 70 - 99 mg/dL 104(H) 118(H) 147(H)  BUN 8 - 23 mg/dL 19 17 18   Creatinine 0.61 - 1.24 mg/dL 0.89 0.85 0.90  Sodium 135 - 145 mmol/L 137 138 136  Potassium 3.5 -  5.1 mmol/L 4.6 4.0 4.2  Chloride 98 - 111 mmol/L 101 107 105  CO2 22 - 32 mmol/L 28 24 24   Calcium 8.9 - 10.3 mg/dL 9.3 8.8(L) 9.3  Total Protein 6.5 - 8.1 g/dL 6.8 6.3(L) 6.9  Total Bilirubin 0.3 - 1.2 mg/dL 0.7 0.5 0.7  Alkaline Phos 38 - 126 U/L 76 91 74  AST 15 - 41 U/L 20 20 22   ALT 0 - 44 U/L 6 10 11       Component Value Date/Time   RBC 4.06 (L) 10/23/2020 1337   MCV 100.7 (H) 10/23/2020 1337   MCH 32.5 10/23/2020 1337   MCHC 32.3 10/23/2020 1337   RDW 14.0 10/23/2020 1337   LYMPHSABS 1.7 10/23/2020 1337   MONOABS 0.4 10/23/2020 1337   EOSABS 0.2 10/23/2020 1337   BASOSABS 0.0 10/23/2020 1337    DIAGNOSTIC IMAGING:  I have independently reviewed the scans and discussed with the patient. No results found.   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 Hodgesis evaluated at the request of Dr. Marvis Moeller hyperleukocytosis. -CBC on 07/23/2020 shows white count 148.3 with differential showing neutrophils, bands, promyelocytes, myelocytes and occasional blasts. Platelet count was 138 and hemoglobin 12.9. -CBC on 03/05/2020 shows white count 15.2 with hemoglobin 15.4 and normal platelet count. -CBC in November 2020 showed white count 10.3 with normal hemoglobin and platelets. -His wife noted 10 pound weight loss in the last 6 months. -Patient reports easy bruising in the last 6 months, mainly on the upper extremities. Denies any fevers or night sweats. No recent infections or hospitalizations. -Last hospitalization was approximately 3 to 4 years ago for TIA. -Dasatinib 70 mg daily started on 08/13/2020.  Held on 09/09/2020 due to fluid  retention. -Bone marrow biopsy on 08/13/2020 with karyotype 54, XY,t(9;22), hypercellular bone marrow with morphological features consistent with CML. -2D echo on 09/10/2020 at [facility] showed normal LV size and function with EF 55%.  Mild concentric hypertrophy.  Normal right ventricle size with function.  Mild to moderate MR.  Trivial pericardial effusion.  No pericardial tamponade. -Dasatinib 20 mg daily started on 10/02/2020.  2. Social/family history: -He lives at home with his wife. 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 He quit smoking in 01/04/1989. -Mother died of cancer, type not known to the patient.   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 -He did not experience any fluid retention.  No PND or orthopnea. -Reviewed his labs today which showed a slightly low platelet count of 129.  White count was normal with normal differential.  Hemoglobin was normal. -Creatinine and LFTs are normal. -Continue dasatinib 20 mg daily.  RTC 6 weeks with repeat labs. -Plan to repeat BCR/ABL by quantitative PCR in 6 weeks on the same day.  2.  Fluid retention: -He is taking Lasix 20 mg daily in the mornings. -Physical examination did not reveal any ankle edema. -Change Lasix to as needed.  3.TLS prophylaxis: -Allopurinol was discontinued as his TLS labs are normal.  4. Difficulty urination: -Continue Flomax 0.4 mg daily.  5.  Parkinson's disease: -Continue carbidopa and levodopa. -Aricept was cut back to 2.5 mg due to interaction with dasatinib.  Orders placed this encounter:  Orders Placed This Encounter  Procedures  . CBC with Differential/Platelet  . Comprehensive metabolic panel  . BCR-ABL1, CML/ALL, PCR, QUANT  . Lactate dehydrogenase  . Magnesium  . Uric acid  . Phosphorus  Derek Jack, MD [place] [phone]   I, Milinda Antis, am acting as a scribe for Dr. Sanda Linger.  I, Derek Jack MD, have reviewed the above documentation for accuracy and completeness, and I agree with the above.

## 2020-10-23 NOTE — Patient Instructions (Signed)
[facility] at [facility] Discharge Instructions  You were seen today by Dr. Ellin Saba. He went over your recent results. Continue taking Sprycel 20 mg daily. Dr. Ellin Saba will see you back in 6 weeks for labs and follow up.   Thank you for choosing [facility] at [facility] to provide your oncology and hematology care.  To afford each patient quality time with our provider, please arrive at least 15 minutes before your scheduled appointment time.   If you have a lab appointment with the Cancer Center please come in thru the Main Entrance and check in at the main information desk  You need to re-schedule your appointment should you arrive 10 or more minutes late.  We strive to give you quality time with our providers, and arriving late affects you and other patients whose appointments are after yours.  Also, if you no show three or more times for appointments you may be dismissed from the clinic at the providers discretion.     Again, thank you for choosing [facility].  Our hope is that these requests will decrease the amount of time that you wait before being seen by our physicians.       _____________________________________________________________  Should you have questions after your visit to [facility], please contact our office at [phone] between the hours of 8:00 a.m. and 4:30 p.m.  Voicemails left after 4:00 p.m. will not be returned until the following business day.  For prescription refill requests, have your pharmacy contact our office and allow 72 hours.    Cancer Center Support Programs:   > Cancer Support Group  2nd Tuesday of the month 1pm-2pm, Journey Room

## 2020-11-21 ENCOUNTER — Other Ambulatory Visit (HOSPITAL_COMMUNITY): Payer: Self-pay | Admitting: *Deleted

## 2020-11-21 DIAGNOSIS — C9210 Chronic myeloid leukemia, BCR/ABL-positive, not having achieved remission: Secondary | ICD-10-CM

## 2020-11-21 MED ORDER — DASATINIB 20 MG PO TABS: 20.0000 mg | ORAL_TABLET | Freq: Every day | ORAL | 11 refills | Status: DC

## 2020-12-11 ENCOUNTER — Inpatient Hospital Stay (HOSPITAL_COMMUNITY): Payer: Medicare Other

## 2020-12-11 ENCOUNTER — Inpatient Hospital Stay (HOSPITAL_COMMUNITY): Payer: Medicare Other | Attending: Hematology | Admitting: Hematology

## 2020-12-11 ENCOUNTER — Other Ambulatory Visit: Payer: Self-pay

## 2020-12-11 VITALS — BP 136/58 | HR 66 | Temp 96.9°F | Resp 18 | Wt 159.7 lb

## 2020-12-11 DIAGNOSIS — Z79899 Other long term (current) drug therapy: Secondary | ICD-10-CM | POA: Diagnosis not present

## 2020-12-11 DIAGNOSIS — R609 Edema, unspecified: Secondary | ICD-10-CM | POA: Insufficient documentation

## 2020-12-11 DIAGNOSIS — C9210 Chronic myeloid leukemia, BCR/ABL-positive, not having achieved remission: Secondary | ICD-10-CM | POA: Diagnosis present

## 2020-12-11 DIAGNOSIS — R634 Abnormal weight loss: Secondary | ICD-10-CM | POA: Insufficient documentation

## 2020-12-11 DIAGNOSIS — Z809 Family history of malignant neoplasm, unspecified: Secondary | ICD-10-CM | POA: Diagnosis not present

## 2020-12-11 DIAGNOSIS — Z87891 Personal history of nicotine dependence: Secondary | ICD-10-CM | POA: Diagnosis not present

## 2020-12-11 DIAGNOSIS — G20 Parkinson's disease: Secondary | ICD-10-CM | POA: Insufficient documentation

## 2020-12-11 LAB — PHOSPHORUS: Phosphorus: 3.1 mg/dL (ref 2.5–4.6)

## 2020-12-11 LAB — COMPREHENSIVE METABOLIC PANEL
ALT: 11 U/L (ref 0–44)
AST: 19 U/L (ref 15–41)
Albumin: 4 g/dL (ref 3.5–5.0)
Alkaline Phosphatase: 68 U/L (ref 38–126)
Anion gap: 6 (ref 5–15)
BUN: 12 mg/dL (ref 8–23)
CO2: 26 mmol/L (ref 22–32)
Calcium: 9 mg/dL (ref 8.9–10.3)
Chloride: 105 mmol/L (ref 98–111)
Creatinine, Ser: 0.72 mg/dL (ref 0.61–1.24)
GFR, Estimated: >60 mL/min (ref >60)
Glucose, Bld: 117 mg/dL — ABNORMAL HIGH (ref 70–99)
Potassium: 4.2 mmol/L (ref 3.5–5.1)
Sodium: 137 mmol/L (ref 135–145)
Total Bilirubin: 0.7 mg/dL (ref 0.3–1.2)
Total Protein: 6.8 g/dL (ref 6.5–8.1)

## 2020-12-11 LAB — CBC WITH DIFFERENTIAL/PLATELET
Abs Immature Granulocytes: 0.01 10*3/uL (ref 0.00–0.07)
Basophils Absolute: 0 10*3/uL (ref 0.0–0.1)
Basophils Relative: 1 %
Eosinophils Absolute: 0.2 10*3/uL (ref 0.0–0.5)
Eosinophils Relative: 4 %
HCT: 41.5 % (ref 39.0–52.0)
Hemoglobin: 14 g/dL (ref 13.0–17.0)
Immature Granulocytes: 0 %
Lymphocytes Relative: 39 %
Lymphs Abs: 2.2 10*3/uL (ref 0.7–4.0)
MCH: 32.8 pg (ref 26.0–34.0)
MCHC: 33.7 g/dL (ref 30.0–36.0)
MCV: 97.2 fL (ref 80.0–100.0)
Monocytes Absolute: 0.4 10*3/uL (ref 0.1–1.0)
Monocytes Relative: 8 %
Neutro Abs: 2.7 10*3/uL (ref 1.7–7.7)
Neutrophils Relative %: 48 %
Platelets: 179 10*3/uL (ref 150–400)
RBC: 4.27 MIL/uL (ref 4.22–5.81)
RDW: 13.6 % (ref 11.5–15.5)
WBC: 5.5 10*3/uL (ref 4.0–10.5)
nRBC: 0 % (ref 0.0–0.2)

## 2020-12-11 LAB — URIC ACID: Uric Acid, Serum: 5.1 mg/dL (ref 3.7–8.6)

## 2020-12-11 LAB — LACTATE DEHYDROGENASE: LDH: 116 U/L (ref 98–192)

## 2020-12-11 LAB — MAGNESIUM: Magnesium: 2.2 mg/dL (ref 1.7–2.4)

## 2020-12-11 NOTE — Patient Instructions (Signed)
[facility] at [facility] Discharge Instructions  You were seen today by Dr. Katragadda. He went over your recent results. Dr. Katragadda will see you back in 6 weeks for labs and follow up.   Thank you for choosing [facility] at [facility] to provide your oncology and hematology care.  To afford each patient quality time with our provider, please arrive at least 15 minutes before your scheduled appointment time.   If you have a lab appointment with the Cancer Center please come in thru the Main Entrance and check in at the main information desk  You need to re-schedule your appointment should you arrive 10 or more minutes late.  We strive to give you quality time with our providers, and arriving late affects you and other patients whose appointments are after yours.  Also, if you no show three or more times for appointments you may be dismissed from the clinic at the providers discretion.     Again, thank you for choosing [facility].  Our hope is that these requests will decrease the amount of time that you wait before being seen by our physicians.       _____________________________________________________________  Should you have questions after your visit to [facility], please contact our office at [phone] between the hours of 8:00 a.m. and 4:30 p.m.  Voicemails left after 4:00 p.m. will not be returned until the following business day.  For prescription refill requests, have your pharmacy contact our office and allow 72 hours.    Cancer Center Support Programs:   > Cancer Support Group  2nd Tuesday of the month 1pm-2pm, Journey Room    

## 2020-12-11 NOTE — Progress Notes (Signed)
[place], [place] [address]   CLINIC:  Medical Oncology/Hematology  PCP:  Lonia Mad, MD No address on file  None  REASON FOR VISIT:  Follow-up for CML  PRIOR THERAPY: None  CURRENT THERAPY: Sprycel 20 mg daily  INTERVAL HISTORY:  [name], a 75 y.o. 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 [place] was last seen on 10/23/2020.  Today he is accompanied by his wife and he reports feeling well. He is tolerating well and denies having N/V/D/C, leg swelling, or severe fatigue.   REVIEW OF SYSTEMS:  Review of Systems  Constitutional: Positive for fatigue (60%). Negative for appetite change.  Cardiovascular: Negative for leg swelling.  Gastrointestinal: Negative for constipation, diarrhea, nausea and vomiting.  Musculoskeletal: Positive for back pain (4/10 back pain).  All other systems reviewed and are negative.   PAST MEDICAL/SURGICAL HISTORY:  No past medical history on file. No past surgical history on file.  SOCIAL HISTORY:  Social History   Socioeconomic History  . Marital status: Married    Spouse name: Not on file  . Number of children: Not on file  . Years of education: Not on file  . Highest education level: Not on file  Occupational History  . Not on file  Tobacco Use  . Smoking status: Never Smoker  . Smokeless tobacco: Never Used  Substance and Sexual Activity  . Alcohol use: Never  . Drug use: Never  . Sexual activity: Not Currently  Other Topics Concern  . Not on file  Social History Narrative  . Not on file   Social Determinants of Health   Financial Resource Strain: Low Risk   . Difficulty of Paying Living Expenses: Not hard at all  Food Insecurity: No Food Insecurity  . Worried About Charity fundraiser in the Last Year: Never true  . Ran Out of Food in the Last Year: Never true  Transportation Needs: No Transportation Needs  . Lack of Transportation (Medical): No  . Lack of  Transportation (Non-Medical): No  Physical Activity: Insufficiently Active  . Days of Exercise per Week: 7 days  . Minutes of Exercise per Session: 10 min  Stress: No Stress Concern Present  . Feeling of Stress : Not at all  Social Connections: Moderately Integrated  . Frequency of Communication with Friends and Family: Three times a week  . Frequency of Social Gatherings with Friends and Family: Once a week  . Attends Religious Services: More than 4 times per year  . Active Member of Clubs or Organizations: No  . Attends Archivist Meetings: Never  . Marital Status: Married  Human resources officer Violence: Not At Risk  . Fear of Current or Ex-Partner: No  . Emotionally Abused: No  . Physically Abused: No  . Sexually Abused: No    FAMILY HISTORY:  No family history on file.  CURRENT MEDICATIONS:  Current Outpatient Medications  Medication Sig Dispense Refill  . ascorbic acid (VITAMIN C) 500 MG tablet daily    . Aspirin 81 MG CAPS Take by mouth.    . B Complex Vitamins (RA B-COMPLEX) TABS     . carbidopa-levodopa (SINEMET IR) 25-100 MG tablet Take by mouth.    . Cholecalciferol 50 MCG (2000 UT) CAPS     . clonazePAM (KLONOPIN) 1 MG tablet clonazepam 1 mg tablet    . dasatinib (SPRYCEL) 20 MG tablet Take 1 tablet (20 mg total) by mouth daily. 30 tablet 11  .  ibuprofen (ADVIL) 200 MG tablet Take by mouth.    Vanessa Kick Ethyl (VASCEPA) 0.5 g CAPS Take 1 capsule by mouth 2 (two) times daily.    Marland Kitchen latanoprost (XALATAN) 0.005 % ophthalmic solution     . lisinopril (ZESTRIL) 20 MG tablet     . Multiple Vitamins-Minerals (CENTRUM SILVER 50+MEN PO) Centrum Silver  daily    . Multiple Vitamins-Minerals (CENTRUM SILVER ADULT 50+) TABS     . Omeprazole 20 MG TBDD omeprazole 20 mg delayed release,disintegrating tablet  Take by oral route.    . potassium chloride SA (KLOR-CON) 20 MEQ tablet 10 mEq.    Marland Kitchen prochlorperazine (COMPAZINE) 10 MG tablet Take 1 tablet (10 mg total) by mouth  every 6 (six) hours as needed for nausea or vomiting. 30 tablet 0  . tamsulosin (FLOMAX) 0.4 MG CAPS capsule Take by mouth.    . vitamin E 28000 units external oil Apply topically daily.     No current facility-administered medications for this visit.    ALLERGIES:  Allergies  Allergen Reactions  . Penicillins Rash    PHYSICAL EXAM:  Performance status (ECOG): 1 - Symptomatic but completely ambulatory  Vitals:   12/11/20 1333  BP: (!) 136/58  Pulse: 66  Resp: 18  Temp: (!) 96.9 F (36.1 C)  SpO2: 97%   Wt Readings from Last 3 Encounters:  12/11/20 159 lb 11.2 oz (72.4 kg)  10/23/20 160 lb 3.2 oz (72.7 kg)  09/11/20 172 lb 3.2 oz (78.1 kg)   Physical Exam Vitals reviewed.  Constitutional:      Appearance: Normal appearance.  Cardiovascular:     Rate and Rhythm: Normal rate and regular rhythm.     Pulses: Normal pulses.     Heart sounds: Normal heart sounds.  Pulmonary:     Effort: Pulmonary effort is normal.     Breath sounds: Normal breath sounds.  Musculoskeletal:     Right lower leg: No edema.     Left lower leg: No edema.  Neurological:     General: No focal deficit present.     Mental Status: He is alert and oriented to person, place, and time.  Psychiatric:        Mood and Affect: Mood normal.        Behavior: Behavior normal.     LABORATORY DATA:  I have reviewed the labs as listed.  CBC Latest Ref Rng & Units 12/11/2020 10/23/2020 09/11/2020  WBC 4.0 - 10.5 K/uL 5.5 5.6 4.1  Hemoglobin 13.0 - 17.0 g/dL 14.0 13.2 10.1(L)  Hematocrit 39.0 - 52.0 % 41.5 40.9 32.0(L)  Platelets 150 - 400 K/uL 179 129(L) 152   CMP Latest Ref Rng & Units 12/11/2020 10/23/2020 09/11/2020  Glucose 70 - 99 mg/dL 117(H) 104(H) 118(H)  BUN 8 - 23 mg/dL _0 Creatinine 0.61 - 1.24 mg/dL 0.72 0.89 0.85  Sodium 135 - 145 mmol/L 137 137 138  Potassium 3.5 - 5.1 mmol/L 4.2 4.6 4.0  Chloride 98 - 111 mmol/L 105 101 107  CO2 22 - 32 mmol/L _1 Calcium 8.9 - 10.3 mg/dL  9.0 9.3 8.8(L)  Total Protein 6.5 - 8.1 g/dL 6.8 6.8 6.3(L)  Total Bilirubin 0.3 - 1.2 mg/dL 0.7 0.7 0.5  Alkaline Phos 38 - 126 U/L 68 76 91  AST 15 - 41 U/L _2 ALT 0 - 44 U/L _3 Component Value Date/Time   RBC 4.27 12/11/2020  1247   MCV 97.2 12/11/2020 1247   MCH 32.8 12/11/2020 1247   MCHC 33.7 12/11/2020 1247   RDW 13.6 12/11/2020 1247   LYMPHSABS 2.2 12/11/2020 1247   MONOABS 0.4 12/11/2020 1247   EOSABS 0.2 12/11/2020 1247   BASOSABS 0.0 12/11/2020 1247   Lab Results  Component Value Date   LDH 116 12/11/2020   LDH 141 09/11/2020   LDH 634 (H) 07/25/2020    DIAGNOSTIC IMAGING:  I have independently reviewed the scans and discussed with the patient. No results found.   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 Hodgesis evaluated at the request of Dr. Marvis Moeller hyperleukocytosis. -CBC on 07/23/2020 shows white count 148.3 with differential showing neutrophils, bands, promyelocytes, myelocytes and occasional blasts. Platelet count was 138 and hemoglobin 12.9. -CBC on 03/05/2020 shows white count 15.2 with hemoglobin 15.4 and normal platelet count. -CBC in November 2020 showed white count 10.3 with normal hemoglobin and platelets. -His wife noted 10 pound weight loss in the last 6 months. -Patient reports easy bruising in the last 6 months, mainly on the upper extremities. Denies any fevers or night sweats. No recent infections or hospitalizations. -Last hospitalization was approximately 3 to 4 years ago for TIA. -Dasatinib 70 mg daily started on 08/13/2020.  Held on 09/09/2020 due to fluid retention. -Bone marrow biopsy on 08/13/2020 with karyotype 41, XY,t(9;22), hypercellular bone marrow with morphological features consistent with CML. -2D echo on 09/10/2020 at [facility] showed normal LV size and function with EF 55%.  Mild concentric hypertrophy.  Normal right ventricle size with function.  Mild to moderate MR.  Trivial  pericardial effusion.  No pericardial tamponade. -Dasatinib 20 mg daily started on 10/02/2020.  2. Social/family history: -He lives at home with his wife. 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 He quit smoking in 12/14/88. -Mother died of cancer, type not known to the patient.   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 -Denies any PND or orthopnea.  No ankle swellings noted. -Reviewed his labs from today which showed normal LFTs and electrolytes.  CBC shows white count 5.5 and platelet count 179 with hemoglobin 14. -BCR/ABL by quantitative PCR is pending.  We will follow it. -RTC 6 weeks for follow-up.  Plan to repeat BCR/ABL by quantitative PCR in 3 months.  2.Fluid retention: -He will take Lasix 20 mg in the mornings as needed.  No ankle swelling at this time.  3.TLS prophylaxis: -We have discontinued allopurinol.  Uric acid is 5.1 today.  Other TLS labs are normal.  4. Difficulty urination: -Continue Flomax 0.4 mg daily.  5. Parkinson's disease: -Continue carbidopa and levodopa.  Aricept was cut back to 2.5 mg due to interaction with dasatinib.  Orders placed this encounter:  No orders of the defined types were placed in this encounter.    Derek Jack, MD [place] [phone]   I, Milinda Antis, am acting as a scribe for Dr. Sanda Linger.  I, Derek Jack MD, have reviewed the above documentation for accuracy and completeness, and I agree with the above.

## 2020-12-21 LAB — BCR-ABL1, CML/ALL, PCR, QUANT
Interpretation (BCRAL):: POSITIVE
b2a2 transcript: 0.2467 %
b3a2 transcript: 2.3651 %

## 2021-01-14 ENCOUNTER — Other Ambulatory Visit (HOSPITAL_COMMUNITY): Payer: Self-pay

## 2021-01-22 ENCOUNTER — Inpatient Hospital Stay (HOSPITAL_BASED_OUTPATIENT_CLINIC_OR_DEPARTMENT_OTHER): Payer: Medicare Other | Admitting: Hematology

## 2021-01-22 ENCOUNTER — Inpatient Hospital Stay (HOSPITAL_COMMUNITY): Payer: Medicare Other | Attending: Hematology

## 2021-01-22 ENCOUNTER — Other Ambulatory Visit: Payer: Self-pay

## 2021-01-22 VITALS — BP 129/57 | HR 75 | Temp 97.2°F | Resp 17 | Wt 158.4 lb

## 2021-01-22 DIAGNOSIS — C9210 Chronic myeloid leukemia, BCR/ABL-positive, not having achieved remission: Secondary | ICD-10-CM

## 2021-01-22 DIAGNOSIS — R39198 Other difficulties with micturition: Secondary | ICD-10-CM | POA: Diagnosis not present

## 2021-01-22 DIAGNOSIS — G20 Parkinson's disease: Secondary | ICD-10-CM | POA: Insufficient documentation

## 2021-01-22 LAB — CBC WITH DIFFERENTIAL/PLATELET
Abs Immature Granulocytes: 0.01 10*3/uL (ref 0.00–0.07)
Basophils Absolute: 0.1 10*3/uL (ref 0.0–0.1)
Basophils Relative: 1 %
Eosinophils Absolute: 0.4 10*3/uL (ref 0.0–0.5)
Eosinophils Relative: 6 %
HCT: 40.2 % (ref 39.0–52.0)
Hemoglobin: 13.8 g/dL (ref 13.0–17.0)
Immature Granulocytes: 0 %
Lymphocytes Relative: 36 %
Lymphs Abs: 2.4 10*3/uL (ref 0.7–4.0)
MCH: 32.8 pg (ref 26.0–34.0)
MCHC: 34.3 g/dL (ref 30.0–36.0)
MCV: 95.5 fL (ref 80.0–100.0)
Monocytes Absolute: 0.5 10*3/uL (ref 0.1–1.0)
Monocytes Relative: 8 %
Neutro Abs: 3.4 10*3/uL (ref 1.7–7.7)
Neutrophils Relative %: 49 %
Platelets: 198 10*3/uL (ref 150–400)
RBC: 4.21 MIL/uL — ABNORMAL LOW (ref 4.22–5.81)
RDW: 13.5 % (ref 11.5–15.5)
WBC: 6.8 10*3/uL (ref 4.0–10.5)
nRBC: 0 % (ref 0.0–0.2)

## 2021-01-22 LAB — COMPREHENSIVE METABOLIC PANEL
ALT: 9 U/L (ref 0–44)
AST: 17 U/L (ref 15–41)
Albumin: 4.2 g/dL (ref 3.5–5.0)
Alkaline Phosphatase: 76 U/L (ref 38–126)
Anion gap: 9 (ref 5–15)
BUN: 16 mg/dL (ref 8–23)
CO2: 24 mmol/L (ref 22–32)
Calcium: 9.5 mg/dL (ref 8.9–10.3)
Chloride: 105 mmol/L (ref 98–111)
Creatinine, Ser: 0.86 mg/dL (ref 0.61–1.24)
GFR, Estimated: >60 mL/min (ref >60)
Glucose, Bld: 105 mg/dL — ABNORMAL HIGH (ref 70–99)
Potassium: 4.3 mmol/L (ref 3.5–5.1)
Sodium: 138 mmol/L (ref 135–145)
Total Bilirubin: 0.8 mg/dL (ref 0.3–1.2)
Total Protein: 7.2 g/dL (ref 6.5–8.1)

## 2021-01-22 LAB — MAGNESIUM: Magnesium: 2.1 mg/dL (ref 1.7–2.4)

## 2021-01-22 NOTE — Progress Notes (Signed)
[place], [place] [address]   CLINIC:  Medical Oncology/Hematology  PCP:  Jacqualine Code, DO [address] / [place] [address]  [phone]  REASON FOR VISIT:  Follow-up for CML  PRIOR THERAPY: None  CURRENT THERAPY: Sprycel 20 mg daily  INTERVAL HISTORY:  [name], a 75 y.o. 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 Kevin Love was last seen on 12/11/2020.  Today he is accompanied by his wife and he reports feeling well. He denies having any leg swelling but he complains of left ankle pain for the last couple months and he suspects having a fracture; he denies having any falls or trauma. He denies problems with walking and denies pain with walking. He has a podiatrist in [place] who tracks his feet. He is taking Sprycel and denies having any problems; he denies having N/V/D/C, SOB or dyspnea. He is not taking Lasix, but he continues taking Flomax. His Aricept was discontinued since it might be interfering with his heart.   REVIEW OF SYSTEMS:  Review of Systems  Constitutional: Positive for fatigue (75%). Negative for appetite change.  Respiratory: Negative for shortness of breath.   Cardiovascular: Negative for leg swelling.  Gastrointestinal: Negative for constipation, diarrhea, nausea and vomiting.  Musculoskeletal: Positive for arthralgias (L ankle pain). Negative for gait problem.  Neurological: Negative for gait problem.  All other systems reviewed and are negative.   PAST MEDICAL/SURGICAL HISTORY:  No past medical history on file. No past surgical history on file.  SOCIAL HISTORY:  Social History   Socioeconomic History  . Marital status: Married    Spouse name: Not on file  . Number of children: Not on file  . Years of education: Not on file  . Highest education level: Not on file  Occupational History  . Not on file  Tobacco Use  . Smoking status: Never Smoker  . Smokeless tobacco: Never Used   Substance and Sexual Activity  . Alcohol use: Never  . Drug use: Never  . Sexual activity: Not Currently  Other Topics Concern  . Not on file  Social History Narrative  . Not on file   Social Determinants of Health   Financial Resource Strain: Low Risk   . Difficulty of Paying Living Expenses: Not hard at all  Food Insecurity: No Food Insecurity  . Worried About Charity fundraiser in the Last Year: Never true  . Ran Out of Food in the Last Year: Never true  Transportation Needs: No Transportation Needs  . Lack of Transportation (Medical): No  . Lack of Transportation (Non-Medical): No  Physical Activity: Insufficiently Active  . Days of Exercise per Week: 7 days  . Minutes of Exercise per Session: 10 min  Stress: No Stress Concern Present  . Feeling of Stress : Not at all  Social Connections: Moderately Integrated  . Frequency of Communication with Friends and Family: Three times a week  . Frequency of Social Gatherings with Friends and Family: Once a week  . Attends Religious Services: More than 4 times per year  . Active Member of Clubs or Organizations: No  . Attends Archivist Meetings: Never  . Marital Status: Married  Human resources officer Violence: Not At Risk  . Fear of Current or Ex-Partner: No  . Emotionally Abused: No  . Physically Abused: No  . Sexually Abused: No    FAMILY HISTORY:  No family history on file.  CURRENT MEDICATIONS:  Current  Outpatient Medications  Medication Sig Dispense Refill  . ascorbic acid (VITAMIN C) 500 MG tablet daily    . Aspirin 81 MG CAPS Take by mouth.    . B Complex Vitamins (RA B-COMPLEX) TABS     . carbidopa-levodopa (SINEMET IR) 25-100 MG tablet Take by mouth.    . Cholecalciferol 50 MCG (2000 UT) CAPS     . clonazePAM (KLONOPIN) 1 MG tablet clonazepam 1 mg tablet    . dasatinib (SPRYCEL) 20 MG tablet Take 1 tablet (20 mg total) by mouth daily. 30 tablet 11  . ibuprofen (ADVIL) 200 MG tablet Take by mouth.    Vanessa Kick Ethyl (VASCEPA) 0.5 g CAPS Take 1 capsule by mouth 2 (two) times daily.    Marland Kitchen latanoprost (XALATAN) 0.005 % ophthalmic solution     . lisinopril (ZESTRIL) 20 MG tablet     . Multiple Vitamins-Minerals (CENTRUM SILVER 50+MEN PO) Centrum Silver  daily    . Multiple Vitamins-Minerals (CENTRUM SILVER ADULT 50+) TABS     . Omeprazole 20 MG TBDD omeprazole 20 mg delayed release,disintegrating tablet  Take by oral route.    . potassium chloride SA (KLOR-CON) 20 MEQ tablet 10 mEq.    Marland Kitchen prochlorperazine (COMPAZINE) 10 MG tablet Take 1 tablet (10 mg total) by mouth every 6 (six) hours as needed for nausea or vomiting. 30 tablet 0  . tamsulosin (FLOMAX) 0.4 MG CAPS capsule Take by mouth.    . vitamin E 28000 units external oil Apply topically daily.     No current facility-administered medications for this visit.    ALLERGIES:  Allergies  Allergen Reactions  . Penicillins Rash    PHYSICAL EXAM:  Performance status (ECOG): 1 - Symptomatic but completely ambulatory  Vitals:   01/22/21 1426  BP: (!) 129/57  Pulse: 75  Resp: 17  Temp: (!) 97.2 F (36.2 C)  SpO2: 97%   Wt Readings from Last 3 Encounters:  01/22/21 158 lb 6.4 oz (71.8 kg)  12/11/20 159 lb 11.2 oz (72.4 kg)  10/23/20 160 lb 3.2 oz (72.7 kg)   Physical Exam Vitals reviewed.  Constitutional:      Appearance: Normal appearance.  Cardiovascular:     Rate and Rhythm: Normal rate and regular rhythm.     Pulses: Normal pulses.     Heart sounds: Normal heart sounds.  Pulmonary:     Effort: Pulmonary effort is normal.     Breath sounds: Normal breath sounds.  Musculoskeletal:     Right lower leg: No edema.     Left lower leg: No edema.  Neurological:     General: No focal deficit present.     Mental Status: He is alert and oriented to person, place, and time.  Psychiatric:        Mood and Affect: Mood normal.        Behavior: Behavior normal.     LABORATORY DATA:  I have reviewed the labs as listed.  CBC  Latest Ref Rng & Units 01/22/2021 12/11/2020 10/23/2020  WBC 4.0 - 10.5 K/uL 6.8 5.5 5.6  Hemoglobin 13.0 - 17.0 g/dL 13.8 14.0 13.2  Hematocrit 39.0 - 52.0 % 40.2 41.5 40.9  Platelets 150 - 400 K/uL 198 179 129(L)   CMP Latest Ref Rng & Units 01/22/2021 12/11/2020 10/23/2020  Glucose 70 - 99 mg/dL 105(H) 117(H) 104(H)  BUN 8 - 23 mg/dL 16 12 19   Creatinine 0.61 - 1.24 mg/dL 0.86 0.72 0.89  Sodium 135 - 145 mmol/L 138  137 137  Potassium 3.5 - 5.1 mmol/L 4.3 4.2 4.6  Chloride 98 - 111 mmol/L 105 105 101  CO2 22 - 32 mmol/L 24 26 28   Calcium 8.9 - 10.3 mg/dL 9.5 9.0 9.3  Total Protein 6.5 - 8.1 g/dL 7.2 6.8 6.8  Total Bilirubin 0.3 - 1.2 mg/dL 0.8 0.7 0.7  Alkaline Phos 38 - 126 U/L 76 68 76  AST 15 - 41 U/L 17 19 20   ALT 0 - 44 U/L 9 11 6       Component Value Date/Time   RBC 4.21 (L) 01/22/2021 1319   MCV 95.5 01/22/2021 1319   MCH 32.8 01/22/2021 1319   MCHC 34.3 01/22/2021 1319   RDW 13.5 01/22/2021 1319   LYMPHSABS 2.4 01/22/2021 1319   MONOABS 0.5 01/22/2021 1319   EOSABS 0.4 01/22/2021 1319   BASOSABS 0.1 01/22/2021 1319   Lab Results  Component Value Date   LDH 116 12/11/2020   LDH 141 09/11/2020   LDH 634 (H) 07/25/2020    DIAGNOSTIC IMAGING:  I have independently reviewed the scans and discussed with the patient. No results found.   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 Hodgesis evaluated at the request of Dr. Marvis Moeller hyperleukocytosis. -CBC on 07/23/2020 shows white count 148.3 with differential showing neutrophils, bands, promyelocytes, myelocytes and occasional blasts. Platelet count was 138 and hemoglobin 12.9. -CBC on 03/05/2020 shows white count 15.2 with hemoglobin 15.4 and normal platelet count. -CBC in November 2020 showed white count 10.3 with normal hemoglobin and platelets. -His wife noted 10 pound weight loss in the last 6 months. -Patient reports easy bruising in the last 6 months, mainly on the upper extremities. Denies any fevers or  night sweats. No recent infections or hospitalizations. -Last hospitalization was approximately 3 to 4 years ago for TIA. -Dasatinib 70 mg daily started on 08/13/2020.Held on 09/09/2020 due to fluid retention. -Bone marrow biopsy on 08/13/2020 with karyotype 60, XY,t(9;22), hypercellular bone marrow with morphological features consistent with CML. -2D echo on 09/10/2020 at [facility] showed normal LV size and function with EF 55%. Mild concentric hypertrophy. Normal right ventricle size with function. Mild to moderate MR. Trivial pericardial effusion. No pericardial tamponade. -Dasatinib 20 mg daily started on 10/02/2020.  2. Social/family history: -He lives at home with his wife. 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 -Mother died of cancer, type not known to the patient.   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  Denies any symptoms of PND or orthopnea. -No ankle swellings on physical exam. -Reviewed his labs which showed normal LFTs.  CBC was grossly normal. -BCR/ABL on 12/11/2020 shows improved B3 A2 and B2 A2 transcripts. -RTC 2 months with repeat labs including BCR/ABL on the same day. -He reports having left ankle pain for the last couple of months.  Pain is worse when he walks.  No swelling noted.  No history of trauma.  I have recommended x-ray of the ankle.  He would like to have it done through his foot doctor in [place].  2.Fluid retention: -No ankle swelling at this time.  He is not requiring Lasix.  3.TLS prophylaxis: -Uric acid has normalized.  Allopurinol was discontinued.  4. Difficulty urination: -Continue Flomax 0.4 mg daily.  5. Parkinson's disease: -Continue carbidopa and levodopa.  Aricept was discontinued.  Orders placed this encounter:  Orders Placed This Encounter  Procedures  .  CBC with Differential/Platelet  .  Comprehensive metabolic panel  . BCR-ABL1, CML/ALL, PCR, QUANT     Derek Jack, MD [place] [phone]   I, Milinda Antis, am acting as a scribe for Dr. Sanda Linger.  I, Derek Jack MD, have reviewed the above documentation for accuracy and completeness, and I agree with the above.

## 2021-01-22 NOTE — Patient Instructions (Signed)
[place] at [facility] Discharge Instructions  You were seen today by Dr. Delton Coombes. He went over your recent results. Make an appointment with your podiatrist to get a scan of your ankle. Dr. Delton Coombes will see you back in 2 months for labs and follow up.   Thank you for choosing [place] at [facility] to provide your oncology and hematology care.  To afford each patient quality time with our provider, please arrive at least 15 minutes before your scheduled appointment time.   If you have a lab appointment with the [facility] please come in thru the Main Entrance and check in at the main information desk  You need to re-schedule your appointment should you arrive 10 or more minutes late.  We strive to give you quality time with our providers, and arriving late affects you and other patients whose appointments are after yours.  Also, if you no show three or more times for appointments you may be dismissed from the clinic at the providers discretion.     Again, thank you for choosing [facility].  Our hope is that these requests will decrease the amount of time that you wait before being seen by our physicians.       _____________________________________________________________  Should you have questions after your visit to [facility], please contact our office at [phone] between the hours of 8:00 a.m. and 4:30 p.m.  Voicemails left after 4:00 p.m. will not be returned until the following business day.  For prescription refill requests, have your pharmacy contact our office and allow 72 hours.    Cancer Center Support Programs:   > Cancer Support Group  2nd Tuesday of the month 1pm-2pm, Journey Room

## 2021-03-20 ENCOUNTER — Inpatient Hospital Stay (HOSPITAL_COMMUNITY): Payer: Medicare Other

## 2021-03-20 ENCOUNTER — Ambulatory Visit (HOSPITAL_COMMUNITY): Payer: Medicare Other | Admitting: Hematology

## 2021-03-20 DIAGNOSIS — C9210 Chronic myeloid leukemia, BCR/ABL-positive, not having achieved remission: Secondary | ICD-10-CM

## 2021-04-23 ENCOUNTER — Other Ambulatory Visit: Payer: Self-pay

## 2021-04-23 ENCOUNTER — Other Ambulatory Visit (HOSPITAL_COMMUNITY): Payer: Self-pay

## 2021-04-23 ENCOUNTER — Ambulatory Visit (HOSPITAL_COMMUNITY): Payer: Medicare Other | Admitting: Hematology and Oncology

## 2021-04-23 ENCOUNTER — Encounter (HOSPITAL_COMMUNITY): Payer: Self-pay | Admitting: Hematology and Oncology

## 2021-04-23 ENCOUNTER — Other Ambulatory Visit (HOSPITAL_COMMUNITY): Payer: Medicare Other

## 2021-04-23 ENCOUNTER — Inpatient Hospital Stay (HOSPITAL_BASED_OUTPATIENT_CLINIC_OR_DEPARTMENT_OTHER): Payer: Medicare Other | Admitting: Hematology and Oncology

## 2021-04-23 ENCOUNTER — Inpatient Hospital Stay (HOSPITAL_COMMUNITY): Payer: Medicare Other | Attending: Hematology

## 2021-04-23 VITALS — BP 122/50 | HR 66 | Temp 97.2°F | Resp 18 | Wt 159.0 lb

## 2021-04-23 DIAGNOSIS — C9210 Chronic myeloid leukemia, BCR/ABL-positive, not having achieved remission: Secondary | ICD-10-CM | POA: Insufficient documentation

## 2021-04-23 DIAGNOSIS — G20 Parkinson's disease: Secondary | ICD-10-CM | POA: Insufficient documentation

## 2021-04-23 DIAGNOSIS — K219 Gastro-esophageal reflux disease without esophagitis: Secondary | ICD-10-CM | POA: Insufficient documentation

## 2021-04-23 DIAGNOSIS — Z8673 Personal history of transient ischemic attack (TIA), and cerebral infarction without residual deficits: Secondary | ICD-10-CM | POA: Insufficient documentation

## 2021-04-23 DIAGNOSIS — G3184 Mild cognitive impairment, so stated: Secondary | ICD-10-CM | POA: Diagnosis not present

## 2021-04-23 LAB — COMPREHENSIVE METABOLIC PANEL
ALT: 10 U/L (ref 0–44)
AST: 19 U/L (ref 15–41)
Albumin: 3.8 g/dL (ref 3.5–5.0)
Alkaline Phosphatase: 63 U/L (ref 38–126)
Anion gap: 8 (ref 5–15)
BUN: 21 mg/dL (ref 8–23)
CO2: 23 mmol/L (ref 22–32)
Calcium: 9.2 mg/dL (ref 8.9–10.3)
Chloride: 106 mmol/L (ref 98–111)
Creatinine, Ser: 0.79 mg/dL (ref 0.61–1.24)
GFR, Estimated: >60 mL/min (ref >60)
Glucose, Bld: 131 mg/dL — ABNORMAL HIGH (ref 70–99)
Potassium: 4.4 mmol/L (ref 3.5–5.1)
Sodium: 137 mmol/L (ref 135–145)
Total Bilirubin: 0.8 mg/dL (ref 0.3–1.2)
Total Protein: 6.7 g/dL (ref 6.5–8.1)

## 2021-04-23 LAB — CBC WITH DIFFERENTIAL/PLATELET
Abs Immature Granulocytes: 0.02 10*3/uL (ref 0.00–0.07)
Basophils Absolute: 0.1 10*3/uL (ref 0.0–0.1)
Basophils Relative: 1 %
Eosinophils Absolute: 0.2 10*3/uL (ref 0.0–0.5)
Eosinophils Relative: 3 %
HCT: 38.6 % — ABNORMAL LOW (ref 39.0–52.0)
Hemoglobin: 13.4 g/dL (ref 13.0–17.0)
Immature Granulocytes: 0 %
Lymphocytes Relative: 30 %
Lymphs Abs: 1.9 10*3/uL (ref 0.7–4.0)
MCH: 34.3 pg — ABNORMAL HIGH (ref 26.0–34.0)
MCHC: 34.7 g/dL (ref 30.0–36.0)
MCV: 98.7 fL (ref 80.0–100.0)
Monocytes Absolute: 0.5 10*3/uL (ref 0.1–1.0)
Monocytes Relative: 8 %
Neutro Abs: 3.6 10*3/uL (ref 1.7–7.7)
Neutrophils Relative %: 58 %
Platelets: 194 10*3/uL (ref 150–400)
RBC: 3.91 MIL/uL — ABNORMAL LOW (ref 4.22–5.81)
RDW: 13.2 % (ref 11.5–15.5)
WBC: 6.3 10*3/uL (ref 4.0–10.5)
nRBC: 0 % (ref 0.0–0.2)

## 2021-04-23 NOTE — Assessment & Plan Note (Addendum)
Overall, he tolerated treatment well I reminded the patient not to take antiacid prescription with proton pump inhibitor while on treatment He will return again in 3 months for further follow-up I will call his wife next week once we have results of his BCR/ABL

## 2021-04-23 NOTE — Progress Notes (Signed)
Kevin Love progress notes  Patient Care Team: Jacqualine Code, DO as PCP - General (Family Medicine) Brien Mates, RN as Oncology Nurse Navigator (Oncology) Derek Jack, MD as Consulting Physician (Hematology)  CHIEF COMPLAINTS/PURPOSE OF VISIT:  CML, on Dasatinib  HISTORY OF PRESENTING ILLNESS:  Kevin Love 75 y.o. male is seen today for further evaluation and treatment of CML He returns with his wife today The patient is a poor historian He denies acid reflux He is taking over-the-counter omeprazole as needed for acid reflux; I have reviewed the medications with the pharmacist and this is not recommended to be used due to interaction with Dasatinib He has no recent infection No fluid retention, shortness of breath or leg swelling No recent falls  I reviewed the patient's records extensive and collaborated the history with the patient. Summary of his history is as follows: -Kevin Love is evaluated at the request of Kevin Love for hyperleukocytosis. -CBC on 07/23/2020 shows white count 148.3 with differential showing neutrophils, bands, promyelocytes, myelocytes and occasional blasts.  Platelet count was 138 and hemoglobin 12.9. -CBC on 03/05/2020 shows white count 15.2 with hemoglobin 15.4 and normal platelet count. -CBC in November 2020 showed white count 10.3 with normal hemoglobin and platelets. -His wife noted 10 pound weight loss in the last 6 months. -Patient reports easy bruising in the last 6 months, mainly on the upper extremities.  Denies any fevers or night sweats.  No recent infections or hospitalizations. -Last hospitalization was approximately 3 to 4 years ago for TIA. -Dasatinib 70 mg daily started on 08/13/2020.  Held on 09/09/2020 due to fluid retention. -Bone marrow biopsy on 08/13/2020 with karyotype 68, XY,t(9;22), hypercellular bone marrow with morphological features consistent with CML. -2D echo on 09/10/2020 at  [facility] Love showed normal LV size and function with EF 55%.  Mild concentric hypertrophy.  Normal right ventricle size with function.  Mild to moderate MR.  Trivial pericardial effusion.  No pericardial tamponade. -Dasatinib 20 mg daily started on 10/02/2020.  MEDICAL HISTORY:  Past Medical History:  Diagnosis Date   Parkinson disease (Kevin Love) 07/25/2020    SURGICAL HISTORY: History reviewed. No pertinent surgical history.  SOCIAL HISTORY: Social History   Socioeconomic History   Marital status: Married    Spouse name: Not on file   Number of children: Not on file   Years of education: Not on file   Highest education level: Not on file  Occupational History   Not on file  Tobacco Use   Smoking status: Never   Smokeless tobacco: Never  Substance and Sexual Activity   Alcohol use: Never   Drug use: Never   Sexual activity: Not Currently  Other Topics Concern   Not on file  Social History Narrative   Not on file   Social Determinants of Health   Financial Resource Strain: Low Risk    Difficulty of Paying Living Expenses: Not hard at all  Food Insecurity: No Food Insecurity   Worried About Running Out of Food in the Last Year: Never true   [place] in the Last Year: Never true  Transportation Needs: No Transportation Needs   Lack of Transportation (Medical): No   Lack of Transportation (Non-Medical): No  Physical Activity: Insufficiently Active   Days of Exercise per Week: 7 days   Minutes of Exercise per Session: 10 min  Stress: No Stress Concern Present   Feeling of Stress : Not at all  Social Connections: Moderately  Integrated   Frequency of Communication with Friends and Family: Three times a week   Frequency of Social Gatherings with Friends and Family: Once a week   Attends Religious Services: More than 4 times per year   Active Member of [employer] or Organizations: No   Attends Archivist Meetings: Never   Marital Status: Married   Human resources officer Violence: Not At Risk   Fear of Current or Ex-Partner: No   Emotionally Abused: No   Physically Abused: No   Sexually Abused: No    FAMILY HISTORY: History reviewed. No pertinent family history.  ALLERGIES:  is allergic to penicillins.  MEDICATIONS:  Current Outpatient Medications  Medication Sig Dispense Refill   ascorbic acid (VITAMIN C) 500 MG tablet daily     Aspirin 81 MG CAPS Take by mouth.     B Complex Vitamins (RA B-COMPLEX) TABS      carbidopa-levodopa (SINEMET IR) 25-100 MG tablet Take by mouth.     Cholecalciferol 50 MCG (2000 UT) CAPS      clonazePAM (KLONOPIN) 1 MG tablet clonazepam 1 mg tablet     dasatinib (SPRYCEL) 20 MG tablet Take 1 tablet (20 mg total) by mouth daily. 30 tablet 11   gemfibrozil (LOPID) 600 MG tablet gemfibrozil 600 mg tablet     ibuprofen (ADVIL) 200 MG tablet Take by mouth.     Icosapent Ethyl (VASCEPA) 0.5 g CAPS Take 1 capsule by mouth 2 (two) times daily.     latanoprost (XALATAN) 0.005 % ophthalmic solution      lisinopril (ZESTRIL) 20 MG tablet      Multiple Vitamins-Minerals (CENTRUM SILVER 50+MEN PO) Centrum Silver  daily     potassium chloride SA (KLOR-CON) 20 MEQ tablet 10 mEq.     PRED FORTE 1 % ophthalmic suspension Place 1 drop into the right eye 3 (three) times daily.     tamsulosin (FLOMAX) 0.4 MG CAPS capsule Take by mouth.     vitamin E 28000 units external oil Apply topically daily.     prochlorperazine (COMPAZINE) 10 MG tablet Take 1 tablet (10 mg total) by mouth every 6 (six) hours as needed for nausea or vomiting. (Patient not taking: Reported on 04/23/2021) 30 tablet 0   No current facility-administered medications for this visit.    REVIEW OF SYSTEMS:   Constitutional: Denies fevers, chills or abnormal night sweats Eyes: Denies blurriness of vision, double vision or watery eyes Ears, nose, mouth, throat, and face: Denies mucositis or sore throat Respiratory: Denies cough, dyspnea or  wheezes Cardiovascular: Denies palpitation, chest discomfort or lower extremity swelling Gastrointestinal:  Denies nausea, heartburn or change in bowel habits Skin: Denies abnormal skin rashes Lymphatics: Denies new lymphadenopathy or easy bruising Neurological:Denies numbness, tingling or new weaknesses Behavioral/Psych: Mood is stable, no new changes  All other systems were reviewed with the patient and are negative.  PHYSICAL EXAMINATION: ECOG PERFORMANCE STATUS: 1 - Symptomatic but completely ambulatory  Vitals:   04/23/21 1242  BP: (!) 122/50  Pulse: 66  Resp: 18  Temp: (!) 97.2 F (36.2 C)  SpO2: 97%   Filed Weights   04/23/21 1242  Weight: 159 lb (72.1 kg)    GENERAL:alert, no distress and comfortable SKIN: skin color, texture, turgor are normal, no rashes or significant lesions EYES: normal, conjunctiva are pink and non-injected, sclera clear OROPHARYNX:no exudate, normal lips, buccal mucosa, and tongue  NECK: supple, thyroid normal size, non-tender, without nodularity LYMPH:  no palpable lymphadenopathy in the cervical, axillary or  inguinal LUNGS: clear to auscultation and percussion with normal breathing effort HEART: regular rate & rhythm and no murmurs without lower extremity edema ABDOMEN:abdomen soft, non-tender and normal bowel sounds Musculoskeletal:no cyanosis of digits and no clubbing  PSYCH: alert & oriented x 3 with fluent speech NEURO: no focal motor/sensory deficits  LABORATORY DATA:  I have reviewed the data as listed Lab Results  Component Value Date   WBC 6.3 04/23/2021   HGB 13.4 04/23/2021   HCT 38.6 (L) 04/23/2021   MCV 98.7 04/23/2021   PLT 194 04/23/2021   Recent Labs    12/11/20 1247 01/22/21 1319 04/23/21 1205  NA 137 138 137  K 4.2 4.3 4.4  CL 105 105 106  CO2 26 24 23   GLUCOSE 117* 105* 131*  BUN 12 16 21   CREATININE 0.72 0.86 0.79  CALCIUM 9.0 9.5 9.2  GFRNONAA >60 >60 >60  PROT 6.8 7.2 6.7  ALBUMIN 4.0 4.2 3.8  AST  19 17 19   ALT 11 9 10   ALKPHOS 68 76 63  BILITOT 0.7 0.8 0.8    ASSESSMENT & PLAN:  CML (chronic myelocytic leukemia) (HCC) Overall, he tolerated treatment well I reminded the patient not to take antiacid prescription with proton pump inhibitor while on treatment He will return again in 3 months for further follow-up I will call his wife next week once we have results of his BCR/ABL  Parkinson disease ([place]) He has no recent falls He has some mild cognitive impairment I reviewed his medication with his wife; there is no contraindication for him to resume Aricept if needed  No orders of the defined types were placed in this encounter.   All questions were answered. The patient knows to call the clinic with any problems, questions or concerns. The total time spent in the appointment was 20 minutes encounter with patients including review of chart and various tests results, discussions about plan of care and coordination of care plan   Heath Lark, MD 04/23/2021 1:36 PM

## 2021-04-23 NOTE — Assessment & Plan Note (Signed)
He has no recent falls He has some mild cognitive impairment I reviewed his medication with his wife; there is no contraindication for him to resume Aricept if needed

## 2021-05-04 LAB — BCR-ABL1, CML/ALL, PCR, QUANT: b3a2 transcript: 0.1625 %

## 2021-07-23 NOTE — Progress Notes (Signed)
[address], [place] [address]   CLINIC:  Medical Oncology/Hematology  PCP:  Jacqualine Code, DO 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 Lonia Mad for hyperleukocytosis. -CBC on 07/23/2020 shows white count 148.3 with differential showing neutrophils, bands, promyelocytes, myelocytes and occasional blasts.  Platelet count was 138 and hemoglobin 12.9. -CBC on 03/05/2020 shows white count 15.2 with hemoglobin 15.4 and normal platelet count. -CBC in November 2020 showed white count 10.3 with normal hemoglobin and platelets.  -Dasatinib 70 mg daily started on 08/13/2020.  Held on 09/09/2020 due to fluid retention. -Bone marrow biopsy on 08/13/2020 with karyotype 63, XY,t(9;22), hypercellular bone marrow with morphological features consistent with CML. -2D echo on 09/10/2020 at [facility] showed normal LV size and function with EF 55%.  Mild concentric hypertrophy.  Normal right ventricle size with function.  Mild to moderate MR.  Trivial pericardial effusion.  No pericardial tamponade. -Dasatinib 20 mg daily started on 10/02/2020.   08/05/2020 Initial Diagnosis   CML (chronic myelocytic leukemia) ([place])     CANCER STAGING: Cancer Staging No matching staging information was found for the patient.  INTERVAL HISTORY:  Kevin Love, a 75 y.o. male, returns for routine follow-up of his CML. Kevin Love was last seen on 01/22/2021.   Today Kevin Love reports feeling good. Kevin Love denies leg swellings, fatigue, abdominal pain, nausea, vomiting, and SOB. His confusion has improved.   REVIEW OF SYSTEMS:  Review of Systems   Constitutional:  Negative for appetite change and fatigue (75%).  Respiratory:  Negative for shortness of breath.   Cardiovascular:  Negative for leg swelling.  Gastrointestinal:  Negative for abdominal pain, nausea and vomiting.  Psychiatric/Behavioral:  Negative for confusion (improved).   All other systems reviewed and are negative.  PAST MEDICAL/SURGICAL HISTORY:  Past Medical History:  Diagnosis Date   Parkinson disease ([place]) 07/25/2020   No past surgical history on file.  SOCIAL HISTORY:  Social History   Socioeconomic History   Marital status: Married    Spouse name: Not on file   Number of children: Not on file   Years of education: Not on file   Highest education level: Not on file  Occupational History   Not on file  Tobacco Use   Smoking status: Never   Smokeless tobacco: Never  Substance and Sexual Activity   Alcohol use: Never   Drug use: Never   Sexual activity: Not Currently  Other Topics Concern   Not on file  Social History Narrative   Not on file   Social Determinants of Health   Financial Resource Strain: Low Risk    Difficulty of Paying Living Expenses: Not hard at all  Food Insecurity: No Food Insecurity   Worried About Running Out of Food in the Last Year: Never true   [place] in the Last Year: Never true  Transportation Needs: No Transportation Needs   Lack of Transportation (Medical): No   Lack of Transportation (Non-Medical): No  Physical Activity: Insufficiently Active   Days of Exercise per Week: 7 days   Minutes of Exercise per Session: 10 min  Stress: No Stress  Concern Present   Feeling of Stress : Not at all  Social Connections: Moderately Integrated   Frequency of Communication with Friends and Family: Three times a week   Frequency of Social Gatherings with Friends and Family: Once a week   Attends Religious Services: More than 4 times per year   Active Member of [employer] or Organizations: No   Attends Archivist  Meetings: Never   Marital Status: Married  Human resources officer Violence: Not At Risk   Fear of Current or Ex-Partner: No   Emotionally Abused: No   Physically Abused: No   Sexually Abused: No    FAMILY HISTORY:  No family history on file.  CURRENT MEDICATIONS:  Current Outpatient Medications  Medication Sig Dispense Refill   ascorbic acid (VITAMIN C) 500 MG tablet daily     Aspirin 81 MG CAPS Take by mouth.     B Complex Vitamins (RA B-COMPLEX) TABS      carbidopa-levodopa (SINEMET IR) 25-100 MG tablet Take by mouth.     Cholecalciferol 50 MCG (2000 UT) CAPS      clonazePAM (KLONOPIN) 1 MG tablet clonazepam 1 mg tablet     dasatinib (SPRYCEL) 20 MG tablet Take 1 tablet (20 mg total) by mouth daily. 30 tablet 11   gemfibrozil (LOPID) 600 MG tablet gemfibrozil 600 mg tablet     ibuprofen (ADVIL) 200 MG tablet Take by mouth.     Icosapent Ethyl (VASCEPA) 0.5 g CAPS Take 1 capsule by mouth 2 (two) times daily.     latanoprost (XALATAN) 0.005 % ophthalmic solution      lisinopril (ZESTRIL) 20 MG tablet      Multiple Vitamins-Minerals (CENTRUM SILVER 50+MEN PO) Centrum Silver  daily     potassium chloride SA (KLOR-CON) 20 MEQ tablet 10 mEq.     PRED FORTE 1 % ophthalmic suspension Place 1 drop into the right eye 3 (three) times daily.     prochlorperazine (COMPAZINE) 10 MG tablet Take 1 tablet (10 mg total) by mouth every 6 (six) hours as needed for nausea or vomiting. (Patient not taking: Reported on 04/23/2021) 30 tablet 0   tamsulosin (FLOMAX) 0.4 MG CAPS capsule Take by mouth.     vitamin E 28000 units external oil Apply topically daily.     No current facility-administered medications for this visit.    ALLERGIES:  Allergies  Allergen Reactions   Penicillins Rash    PHYSICAL EXAM:  Performance status (ECOG): 1 - Symptomatic but completely ambulatory  There were no vitals filed for this visit. Wt Readings from Last 3 Encounters:  04/23/21 159 lb (72.1 kg)  01/22/21 158 lb  6.4 oz (71.8 kg)  12/11/20 159 lb 11.2 oz (72.4 kg)   Physical Exam Vitals reviewed.  Constitutional:      Appearance: Normal appearance.  Cardiovascular:     Rate and Rhythm: Normal rate and regular rhythm.     Pulses: Normal pulses.     Heart sounds: Normal heart sounds.  Pulmonary:     Effort: Pulmonary effort is normal.     Breath sounds: Normal breath sounds.  Neurological:     General: No focal deficit present.     Mental Status: Kevin Love is alert and oriented to person, place, and time.  Psychiatric:        Mood and Affect: Mood normal.        Behavior: Behavior normal.     LABORATORY DATA:  I have reviewed the labs as listed.  CBC  Latest Ref Rng & Units 04/23/2021 01/22/2021 12/11/2020  WBC 4.0 - 10.5 K/uL 6.3 6.8 5.5  Hemoglobin 13.0 - 17.0 g/dL 13.4 13.8 14.0  Hematocrit 39.0 - 52.0 % 38.6(L) 40.2 41.5  Platelets 150 - 400 K/uL 194 198 179   CMP Latest Ref Rng & Units 04/23/2021 01/22/2021 12/11/2020  Glucose 70 - 99 mg/dL 131(H) 105(H) 117(H)  BUN 8 - 23 mg/dL 21 16 12   Creatinine 0.61 - 1.24 mg/dL 0.79 0.86 0.72  Sodium 135 - 145 mmol/L 137 138 137  Potassium 3.5 - 5.1 mmol/L 4.4 4.3 4.2  Chloride 98 - 111 mmol/L 106 105 105  CO2 22 - 32 mmol/L 23 24 26   Calcium 8.9 - 10.3 mg/dL 9.2 9.5 9.0  Total Protein 6.5 - 8.1 g/dL 6.7 7.2 6.8  Total Bilirubin 0.3 - 1.2 mg/dL 0.8 0.8 0.7  Alkaline Phos 38 - 126 U/L 63 76 68  AST 15 - 41 U/L 19 17 19   ALT 0 - 44 U/L 10 9 11     DIAGNOSTIC IMAGING:  I have independently reviewed the scans and discussed with the patient. No results found.   ASSESSMENT:  1.  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 Lonia Mad for hyperleukocytosis. -CBC on 07/23/2020 shows white count 148.3 with differential showing neutrophils, bands, promyelocytes, myelocytes and occasional blasts.  Platelet count was 138 and hemoglobin 12.9. -CBC on 03/05/2020 shows white count 15.2 with hemoglobin 15.4 and normal platelet count. -CBC  in November 2020 showed white count 10.3 with normal hemoglobin and platelets. -His wife noted 10 pound weight loss in the last 6 months. -Patient reports easy bruising in the last 6 months, mainly on the upper extremities.  Denies any fevers or night sweats.  No recent infections or hospitalizations. -Last hospitalization was approximately 3 to 4 years ago for TIA. -Dasatinib 70 mg daily started on 08/13/2020.  Held on 09/09/2020 due to fluid retention. -Bone marrow biopsy on 08/13/2020 with karyotype 29, XY,t(9;22), hypercellular bone marrow with morphological features consistent with CML. -2D echo on 09/10/2020 at [facility] showed normal LV size and function with EF 55%.  Mild concentric hypertrophy.  Normal right ventricle size with function.  Mild to moderate MR.  Trivial pericardial effusion.  No pericardial tamponade. -Dasatinib 20 mg daily started on 10/02/2020.   2.  Social/family history: -Kevin Love lives at home with his wife.  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  Kevin Love quit smoking in 12-31-1988. -Mother died of cancer, type not known to the patient.   PLAN:  1.  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 - Kevin Love denies any symptoms of PND or orthopnea.  Lungs are clear to auscultation. - We reviewed labs from 07/24/2021 which showed white count 7.4.  Normal hemoglobin and platelet count.  Normal white cell differential.  LFTs are normal. - Kevin Love was told to continue his Dasatinib at same dose of 20 mg daily.  We have sent BCR/ABL by quantitative PCR which is pending at this time.  Last BCR/ABL showed B3 A2 transcript improved to 0.1625. - Kevin Love was told not to take Protonix with Dasatinib.  H2 blockers are also contraindicated.  Kevin Love takes Dasatinib at 11 PM.  Kevin Love can take antacids like Tums or Maalox but not within 2 hours before or after Dasatinib. - Potassium today is 5.  I have told him to discontinue potassium tablet. - RTC 3  months with labs on same day.  2.  Fluid retention: - Kevin Love does not have any leg swellings.  Kevin Love is not taking Lasix.   3.  Memory problems: - Continue memantine 10 mg 2 tablets daily which is helping with memory.   4.  Difficulty urination: - Continue Flomax 0.4 mg daily.   5.  Parkinson's disease: - Continue carbidopa levodopa 25-100 mg 4 times daily.   Orders placed this encounter:  No orders of the defined types were placed in this encounter.    Derek Jack, MD [place] [phone]   I, Thana Ates, am acting as a scribe for Dr. Derek Jack.  I, Derek Jack MD, have reviewed the above documentation for accuracy and completeness, and I agree with the above.

## 2021-07-24 ENCOUNTER — Inpatient Hospital Stay (HOSPITAL_COMMUNITY): Payer: Medicare Other

## 2021-07-24 ENCOUNTER — Other Ambulatory Visit: Payer: Self-pay

## 2021-07-24 ENCOUNTER — Inpatient Hospital Stay (HOSPITAL_COMMUNITY): Payer: Medicare Other | Attending: Hematology | Admitting: Hematology

## 2021-07-24 VITALS — BP 131/56 | HR 62 | Temp 97.0°F | Resp 18 | Wt 161.2 lb

## 2021-07-24 DIAGNOSIS — R413 Other amnesia: Secondary | ICD-10-CM | POA: Diagnosis not present

## 2021-07-24 DIAGNOSIS — Z809 Family history of malignant neoplasm, unspecified: Secondary | ICD-10-CM | POA: Diagnosis not present

## 2021-07-24 DIAGNOSIS — G20 Parkinson's disease: Secondary | ICD-10-CM | POA: Insufficient documentation

## 2021-07-24 DIAGNOSIS — I3139 Other pericardial effusion (noninflammatory): Secondary | ICD-10-CM | POA: Diagnosis not present

## 2021-07-24 DIAGNOSIS — R609 Edema, unspecified: Secondary | ICD-10-CM | POA: Insufficient documentation

## 2021-07-24 DIAGNOSIS — R39198 Other difficulties with micturition: Secondary | ICD-10-CM | POA: Diagnosis not present

## 2021-07-24 DIAGNOSIS — Z87891 Personal history of nicotine dependence: Secondary | ICD-10-CM | POA: Diagnosis not present

## 2021-07-24 DIAGNOSIS — R634 Abnormal weight loss: Secondary | ICD-10-CM | POA: Diagnosis not present

## 2021-07-24 DIAGNOSIS — C9210 Chronic myeloid leukemia, BCR/ABL-positive, not having achieved remission: Secondary | ICD-10-CM | POA: Diagnosis not present

## 2021-07-24 LAB — COMPREHENSIVE METABOLIC PANEL
ALT: 18 U/L (ref 0–44)
AST: 21 U/L (ref 15–41)
Albumin: 4.1 g/dL (ref 3.5–5.0)
Alkaline Phosphatase: 63 U/L (ref 38–126)
Anion gap: 5 (ref 5–15)
BUN: 25 mg/dL — ABNORMAL HIGH (ref 8–23)
CO2: 27 mmol/L (ref 22–32)
Calcium: 9.4 mg/dL (ref 8.9–10.3)
Chloride: 104 mmol/L (ref 98–111)
Creatinine, Ser: 0.98 mg/dL (ref 0.61–1.24)
GFR, Estimated: >60 mL/min (ref >60)
Glucose, Bld: 117 mg/dL — ABNORMAL HIGH (ref 70–99)
Potassium: 5 mmol/L (ref 3.5–5.1)
Sodium: 136 mmol/L (ref 135–145)
Total Bilirubin: 0.7 mg/dL (ref 0.3–1.2)
Total Protein: 6.9 g/dL (ref 6.5–8.1)

## 2021-07-24 LAB — CBC WITH DIFFERENTIAL/PLATELET
Abs Immature Granulocytes: 0.02 10*3/uL (ref 0.00–0.07)
Basophils Absolute: 0.1 10*3/uL (ref 0.0–0.1)
Basophils Relative: 1 %
Eosinophils Absolute: 0.2 10*3/uL (ref 0.0–0.5)
Eosinophils Relative: 3 %
HCT: 41.5 % (ref 39.0–52.0)
Hemoglobin: 14.3 g/dL (ref 13.0–17.0)
Immature Granulocytes: 0 %
Lymphocytes Relative: 28 %
Lymphs Abs: 2.1 10*3/uL (ref 0.7–4.0)
MCH: 34.4 pg — ABNORMAL HIGH (ref 26.0–34.0)
MCHC: 34.5 g/dL (ref 30.0–36.0)
MCV: 99.8 fL (ref 80.0–100.0)
Monocytes Absolute: 0.6 10*3/uL (ref 0.1–1.0)
Monocytes Relative: 8 %
Neutro Abs: 4.4 10*3/uL (ref 1.7–7.7)
Neutrophils Relative %: 60 %
Platelets: 197 10*3/uL (ref 150–400)
RBC: 4.16 MIL/uL — ABNORMAL LOW (ref 4.22–5.81)
RDW: 13.3 % (ref 11.5–15.5)
WBC: 7.4 10*3/uL (ref 4.0–10.5)
nRBC: 0 % (ref 0.0–0.2)

## 2021-07-24 LAB — MAGNESIUM: Magnesium: 2.3 mg/dL (ref 1.7–2.4)

## 2021-07-24 NOTE — Patient Instructions (Addendum)
[place] at [facility] Discharge Instructions  You were seen today by Dr. Delton Coombes. He went over your recent results. Stop taking Potassium. Dr. Delton Coombes will see you back in 3 months for labs and follow up.   Thank you for choosing [place] at [facility] to provide your oncology and hematology care.  To afford each patient quality time with our provider, please arrive at least 15 minutes before your scheduled appointment time.   If you have a lab appointment with the [place] please come in thru the Main Entrance and check in at the main information desk  You need to re-schedule your appointment should you arrive 10 or more minutes late.  We strive to give you quality time with our providers, and arriving late affects you and other patients whose appointments are after yours.  Also, if you no show three or more times for appointments you may be dismissed from the clinic at the providers discretion.     Again, thank you for choosing [facility].  Our hope is that these requests will decrease the amount of time that you wait before being seen by our physicians.       _____________________________________________________________  Should you have questions after your visit to [facility], please contact our office at [phone] between the hours of 8:00 a.m. and 4:30 p.m.  Voicemails left after 4:00 p.m. will not be returned until the following business day.  For prescription refill requests, have your pharmacy contact our office and allow 72 hours.    Cancer Center Support Programs:   > Cancer Support Group  2nd Tuesday of the month 1pm-2pm, Journey Room

## 2021-08-03 LAB — BCR-ABL1, CML/ALL, PCR, QUANT: b3a2 transcript: 0.0932 %

## 2021-08-28 LAB — FLOW CYTOMETRY

## 2021-09-03 ENCOUNTER — Other Ambulatory Visit (HOSPITAL_COMMUNITY): Payer: Self-pay | Admitting: *Deleted

## 2021-09-03 DIAGNOSIS — C9210 Chronic myeloid leukemia, BCR/ABL-positive, not having achieved remission: Secondary | ICD-10-CM

## 2021-09-03 MED ORDER — DASATINIB 20 MG PO TABS: 20.0000 mg | ORAL_TABLET | Freq: Every day | ORAL | 11 refills | Status: DC

## 2021-09-03 NOTE — Telephone Encounter (Signed)
Refill sent to [facility] for sprycel 20 mg daily.  Per Dr. Tomie China note 07/24/2021, patient is to continue on this medication.

## 2021-11-06 ENCOUNTER — Inpatient Hospital Stay (HOSPITAL_COMMUNITY): Payer: Medicare Other | Attending: Hematology | Admitting: Hematology

## 2021-11-06 ENCOUNTER — Other Ambulatory Visit: Payer: Self-pay

## 2021-11-06 ENCOUNTER — Encounter (HOSPITAL_COMMUNITY): Payer: Self-pay | Admitting: Hematology

## 2021-11-06 ENCOUNTER — Inpatient Hospital Stay (HOSPITAL_COMMUNITY): Payer: Medicare Other

## 2021-11-06 VITALS — BP 139/62 | HR 72 | Temp 98.6°F | Resp 18 | Ht 65.0 in | Wt 163.6 lb

## 2021-11-06 DIAGNOSIS — G20 Parkinson's disease: Secondary | ICD-10-CM | POA: Insufficient documentation

## 2021-11-06 DIAGNOSIS — C9210 Chronic myeloid leukemia, BCR/ABL-positive, not having achieved remission: Secondary | ICD-10-CM | POA: Diagnosis present

## 2021-11-06 DIAGNOSIS — R39198 Other difficulties with micturition: Secondary | ICD-10-CM | POA: Insufficient documentation

## 2021-11-06 DIAGNOSIS — R609 Edema, unspecified: Secondary | ICD-10-CM | POA: Diagnosis not present

## 2021-11-06 DIAGNOSIS — Z79899 Other long term (current) drug therapy: Secondary | ICD-10-CM | POA: Insufficient documentation

## 2021-11-06 DIAGNOSIS — Z87891 Personal history of nicotine dependence: Secondary | ICD-10-CM | POA: Insufficient documentation

## 2021-11-06 LAB — CBC WITH DIFFERENTIAL/PLATELET
Abs Immature Granulocytes: 0.02 10*3/uL (ref 0.00–0.07)
Basophils Absolute: 0 10*3/uL (ref 0.0–0.1)
Basophils Relative: 1 %
Eosinophils Absolute: 0.2 10*3/uL (ref 0.0–0.5)
Eosinophils Relative: 3 %
HCT: 37.8 % — ABNORMAL LOW (ref 39.0–52.0)
Hemoglobin: 13 g/dL (ref 13.0–17.0)
Immature Granulocytes: 0 %
Lymphocytes Relative: 29 %
Lymphs Abs: 1.9 10*3/uL (ref 0.7–4.0)
MCH: 34 pg (ref 26.0–34.0)
MCHC: 34.4 g/dL (ref 30.0–36.0)
MCV: 99 fL (ref 80.0–100.0)
Monocytes Absolute: 0.5 10*3/uL (ref 0.1–1.0)
Monocytes Relative: 8 %
Neutro Abs: 3.8 10*3/uL (ref 1.7–7.7)
Neutrophils Relative %: 59 %
Platelets: 205 10*3/uL (ref 150–400)
RBC: 3.82 MIL/uL — ABNORMAL LOW (ref 4.22–5.81)
RDW: 13 % (ref 11.5–15.5)
WBC: 6.4 10*3/uL (ref 4.0–10.5)
nRBC: 0 % (ref 0.0–0.2)

## 2021-11-06 LAB — COMPREHENSIVE METABOLIC PANEL
ALT: 6 U/L (ref 0–44)
AST: 17 U/L (ref 15–41)
Albumin: 3.9 g/dL (ref 3.5–5.0)
Alkaline Phosphatase: 57 U/L (ref 38–126)
Anion gap: 9 (ref 5–15)
BUN: 24 mg/dL — ABNORMAL HIGH (ref 8–23)
CO2: 23 mmol/L (ref 22–32)
Calcium: 9 mg/dL (ref 8.9–10.3)
Chloride: 106 mmol/L (ref 98–111)
Creatinine, Ser: 0.93 mg/dL (ref 0.61–1.24)
GFR, Estimated: >60 mL/min (ref >60)
Glucose, Bld: 171 mg/dL — ABNORMAL HIGH (ref 70–99)
Potassium: 4.2 mmol/L (ref 3.5–5.1)
Sodium: 138 mmol/L (ref 135–145)
Total Bilirubin: 0.5 mg/dL (ref 0.3–1.2)
Total Protein: 6.6 g/dL (ref 6.5–8.1)

## 2021-11-06 LAB — LACTATE DEHYDROGENASE: LDH: 118 U/L (ref 98–192)

## 2021-11-06 NOTE — Progress Notes (Signed)
Kevin Love, Kevin Love [id number]   CLINIC:  Medical Oncology/Hematology  PCP:  Kevin Code, DO [address] / [place] [address]  [phone]  REASON FOR VISIT:  Follow-up for CML  PRIOR THERAPY: none  CURRENT THERAPY: Sprycel 20 mg daily  INTERVAL HISTORY:  Kevin Love, a 76 y.o. 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 Kevin Love was last seen on 07/24/2021.  Today he reports feeling good. He is no longer taking Potassium. He denies leg swellings.   REVIEW OF SYSTEMS:  Review of Systems  Constitutional:  Negative for appetite change and fatigue.  Cardiovascular:  Negative for leg swelling.  All other systems reviewed and are negative.  PAST MEDICAL/SURGICAL HISTORY:  Past Medical History:  Diagnosis Date   Parkinson disease ([place]) 07/25/2020   History reviewed. No pertinent surgical history.  SOCIAL HISTORY:  Social History   Socioeconomic History   Marital status: Married    Spouse name: Not on file   Number of children: Not on file   Years of education: Not on file   Highest education level: Not on file  Occupational History   Not on file  Tobacco Use   Smoking status: Never   Smokeless tobacco: Never  Substance and Sexual Activity   Alcohol use: Never   Drug use: Never   Sexual activity: Not Currently  Other Topics Concern   Not on file  Social History Narrative   Not on file   Social Determinants of Health   Financial Resource Strain: Not on file  Food Insecurity: Not on file  Transportation Needs: Not on file  Physical Activity: Not on file  Stress: Not on file  Social Connections: Not on file  Intimate Partner Violence: Not on file    FAMILY HISTORY:  History reviewed. No pertinent family history.  CURRENT MEDICATIONS:  Current Outpatient Medications  Medication Sig Dispense Refill   ascorbic acid (VITAMIN C) 500 MG tablet daily     B Complex Vitamins (RA B-COMPLEX) TABS       carbidopa-levodopa (SINEMET IR) 25-100 MG tablet Take by mouth.     Cholecalciferol 50 MCG (2000 UT) CAPS      clonazePAM (KLONOPIN) 0.5 MG tablet 0.5 mg.     dasatinib (SPRYCEL) 20 MG tablet Take 1 tablet (20 mg total) by mouth daily. 30 tablet 11   gemfibrozil (LOPID) 600 MG tablet gemfibrozil 600 mg tablet     ibuprofen (ADVIL) 200 MG tablet Take by mouth.     Icosapent Ethyl (VASCEPA) 0.5 g CAPS Take 1 capsule by mouth 2 (two) times daily.     latanoprost (XALATAN) 0.005 % ophthalmic solution      lisinopril (ZESTRIL) 10 MG tablet      memantine (NAMENDA) 10 MG tablet Take 10 mg by mouth 2 (two) times daily.     Multiple Vitamins-Minerals (CENTRUM SILVER 50+MEN PO) Centrum Silver  daily     PRED FORTE 1 % ophthalmic suspension Place 1 drop into the right eye 3 (three) times daily.     prochlorperazine (COMPAZINE) 10 MG tablet Take 1 tablet (10 mg total) by mouth every 6 (six) hours as needed for nausea or vomiting. 30 tablet 0   tamsulosin (FLOMAX) 0.4 MG CAPS capsule Take by mouth.     vitamin E 28000 units external oil Apply topically daily.     potassium chloride SA (KLOR-CON) 20 MEQ tablet 10 mEq. (Patient not taking: Reported on 11/06/2021)  No current facility-administered medications for this visit.    ALLERGIES:  Allergies  Allergen Reactions   Penicillins Rash    PHYSICAL EXAM:  Performance status (ECOG): 1 - Symptomatic but completely ambulatory  Vitals:   11/06/21 1458  BP: 139/62  Pulse: 72  Resp: 18  Temp: 98.6 F (37 C)  SpO2: 96%   Wt Readings from Last 3 Encounters:  11/06/21 163 lb 9.3 oz (74.2 kg)  07/24/21 161 lb 3.2 oz (73.1 kg)  04/23/21 159 lb (72.1 kg)   Physical Exam Vitals reviewed.  Constitutional:      Appearance: Normal appearance.  Cardiovascular:     Rate and Rhythm: Normal rate and regular rhythm.     Pulses: Normal pulses.     Heart sounds: Normal heart sounds.  Pulmonary:     Effort: Pulmonary effort is normal.     Breath  sounds: Normal breath sounds.  Abdominal:     Palpations: Abdomen is soft. There is no hepatomegaly, splenomegaly or mass.     Tenderness: There is no abdominal tenderness.  Musculoskeletal:     Right lower leg: No edema.     Left lower leg: No edema.  Neurological:     General: No focal deficit present.     Mental Status: He is alert and oriented to person, place, and time.  Psychiatric:        Mood and Affect: Mood normal.        Behavior: Behavior normal.    LABORATORY DATA:  I have reviewed the labs as listed.  CBC Latest Ref Rng & Units 11/06/2021 07/24/2021 04/23/2021  WBC 4.0 - 10.5 K/uL 6.4 7.4 6.3  Hemoglobin 13.0 - 17.0 g/dL 13.0 14.3 13.4  Hematocrit 39.0 - 52.0 % 37.8(L) 41.5 38.6(L)  Platelets 150 - 400 K/uL 205 197 194   CMP Latest Ref Rng & Units 11/06/2021 07/24/2021 04/23/2021  Glucose 70 - 99 mg/dL 171(H) 117(H) 131(H)  BUN 8 - 23 mg/dL 24(H) 25(H) 21  Creatinine 0.61 - 1.24 mg/dL 0.93 0.98 0.79  Sodium 135 - 145 mmol/L 138 136 137  Potassium 3.5 - 5.1 mmol/L 4.2 5.0 4.4  Chloride 98 - 111 mmol/L 106 104 106  CO2 22 - 32 mmol/L 23 27 23   Calcium 8.9 - 10.3 mg/dL 9.0 9.4 9.2  Total Protein 6.5 - 8.1 g/dL 6.6 6.9 6.7  Total Bilirubin 0.3 - 1.2 mg/dL 0.5 0.7 0.8  Alkaline Phos 38 - 126 U/L 57 63 63  AST 15 - 41 U/L 17 21 19   ALT 0 - 44 U/L 6 18 10       Component Value Date/Time   RBC 3.82 (L) 11/06/2021 1412   MCV 99.0 11/06/2021 1412   MCH 34.0 11/06/2021 1412   MCHC 34.4 11/06/2021 1412   RDW 13.0 11/06/2021 1412   LYMPHSABS 1.9 11/06/2021 1412   MONOABS 0.5 11/06/2021 1412   EOSABS 0.2 11/06/2021 1412   BASOSABS 0.0 11/06/2021 1412    DIAGNOSTIC IMAGING:  I have independently reviewed the scans and discussed with the patient. No results found.   ASSESSMENT:  1.  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 is evaluated at the request of Dr. Lonia Mad for hyperleukocytosis. -CBC on 07/23/2020 shows white count 148.3 with differential showing neutrophils,  bands, promyelocytes, myelocytes and occasional blasts.  Platelet count was 138 and hemoglobin 12.9. -CBC on 03/05/2020 shows white count 15.2 with hemoglobin 15.4 and normal platelet count. -CBC in November 2020 showed white count 10.3 with normal  hemoglobin and platelets. -His wife noted 10 pound weight loss in the last 6 months. -Patient reports easy bruising in the last 6 months, mainly on the upper extremities.  Denies any fevers or night sweats.  No recent infections or hospitalizations. -Last hospitalization was approximately 3 to 4 years ago for TIA. -Dasatinib 70 mg daily started on 08/13/2020.  Held on 09/09/2020 due to fluid retention. -Bone marrow biopsy on 08/13/2020 with karyotype 30, XY,t(9;22), hypercellular bone marrow with morphological features consistent with CML. -2D echo on 09/10/2020 at [facility] showed normal LV size and function with EF 55%.  Mild concentric hypertrophy.  Normal right ventricle size with function.  Mild to moderate MR.  Trivial pericardial effusion.  No pericardial tamponade. -Dasatinib 20 mg daily started on 10/02/2020.   2.  Social/family history: -He lives at home with his wife.  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  He quit smoking in 1988/12/15. -Mother died of cancer, type not known to the patient.    PLAN:  1.  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 - He has discontinued Protonix.  He takes an occasional Tums or Gaviscon. - Reviewed labs from today which showed normal LFTs and CBC.  LDH was normal.  Sugar was elevated at 171. - Physical examination was benign with no splenomegaly palpable. - Reviewed BCR/ABL by PCR from 07/24/2021 which showed B3 A2 transcript 0.0932%, continue to improve.  We have sent a level from today which is pending. - I have recommended continue Dasatinib 20 mg daily.  If he needs to take an H2 blocker, space it out by 10 to  12 hours. - RTC 3 months with repeat labs on the same day.   2.  Fluid retention: - He has been off of Lasix.  No leg swellings today.   3.  Memory problems: - Continue memantine 10 mg 2 tablets daily.  Wife reports that it has been helping.   4.  Difficulty urination: - Continue Flomax 0.4 mg daily.   5.  Parkinson's disease: - Continue carbidopa levodopa 25-100 mg 4 times daily.  Orders placed this encounter:  No orders of the defined types were placed in this encounter.    Derek Jack, MD [place] [phone]   I, Thana Ates, am acting as a scribe for Dr. Derek Jack.  I, Derek Jack MD, have reviewed the above documentation for accuracy and completeness, and I agree with the above.

## 2021-11-06 NOTE — Patient Instructions (Addendum)
[place] [facility] Discharge Instructions  You were seen and examined today by Dr. Delton Coombes. He reviewed your most recent labs and everything is looking good. If you need to take a Tums take it in the morning to spread it out from when you take your Sprycel. Please keep appointment as scheduled in 3 months.   Thank you for choosing [place] at [facility] to provide your oncology and hematology care.  To afford each patient quality time with our provider, please arrive at least 15 minutes before your scheduled appointment time.   If you have a lab appointment with the [facility] please come in thru the Main Entrance and check in at the main information desk.  You need to re-schedule your appointment should you arrive 10 or more minutes late.  We strive to give you quality time with our providers, and arriving late affects you and other patients whose appointments are after yours.  Also, if you no show three or more times for appointments you may be dismissed from the clinic at the providers discretion.     Again, thank you for choosing [facility].  Our hope is that these requests will decrease the amount of time that you wait before being seen by our physicians.       _____________________________________________________________  Should you have questions after your visit to [facility], please contact our office at [phone] and follow the prompts.  Our office hours are 8:00 a.m. and 4:30 p.m. Monday - Friday.  Please note that voicemails left after 4:00 p.m. may not be returned until the following business day.  We are closed weekends and major holidays.  You do have access to a nurse 24-7, just call the main number to the clinic [phone] and do not press any options, hold on the line and a nurse will answer the phone.    For prescription refill requests, have your pharmacy contact our office and allow  72 hours.    Due to Covid, you will need to wear a mask upon entering the hospital. If you do not have a mask, a mask will be given to you at the Main Entrance upon arrival. For doctor visits, patients may have 1 support person age 8 or older with them. For treatment visits, patients can not have anyone with them due to social distancing guidelines and our immunocompromised population.

## 2021-11-07 ENCOUNTER — Other Ambulatory Visit (HOSPITAL_COMMUNITY): Payer: Self-pay

## 2021-11-07 DIAGNOSIS — C9210 Chronic myeloid leukemia, BCR/ABL-positive, not having achieved remission: Secondary | ICD-10-CM

## 2021-11-07 MED ORDER — DASATINIB 20 MG PO TABS: 20.0000 mg | ORAL_TABLET | Freq: Every day | ORAL | 11 refills | Status: DC

## 2021-11-07 NOTE — Telephone Encounter (Signed)
Chart reviewed. Sprycel refilled per last office note with Dr. Delton Coombes.

## 2021-11-11 LAB — BCR-ABL1, CML/ALL, PCR, QUANT: b3a2 transcript: 0.0917 %

## 2021-12-02 ENCOUNTER — Other Ambulatory Visit (HOSPITAL_COMMUNITY): Payer: Self-pay

## 2021-12-03 ENCOUNTER — Other Ambulatory Visit: Payer: Self-pay | Admitting: Family Medicine

## 2021-12-03 DIAGNOSIS — M549 Dorsalgia, unspecified: Secondary | ICD-10-CM

## 2022-01-05 ENCOUNTER — Other Ambulatory Visit: Payer: Self-pay

## 2022-01-05 ENCOUNTER — Ambulatory Visit
Admission: RE | Admit: 2022-01-05 | Discharge: 2022-01-05 | Disposition: A | Discharge status: Home / Self Care | Payer: Medicare Other | Source: Ambulatory Visit | Attending: Family Medicine | Admitting: Family Medicine

## 2022-01-05 DIAGNOSIS — M549 Dorsalgia, unspecified: Secondary | ICD-10-CM

## 2022-01-05 MED ORDER — METHYLPREDNISOLONE ACETATE 40 MG/ML INJ SUSP (RADIOLOG
80.0000 mg | Freq: Once | INTRAMUSCULAR | Status: AC
  Administered 2022-01-05: 80 mg via EPIDURAL

## 2022-01-05 MED ORDER — IOPAMIDOL (ISOVUE-M 200) INJECTION 41%
1.0000 mL | Freq: Once | INTRAMUSCULAR | Status: AC
  Administered 2022-01-05: 1 mL via EPIDURAL

## 2022-01-05 NOTE — Discharge Instructions (Signed)

## 2022-02-05 ENCOUNTER — Ambulatory Visit (HOSPITAL_COMMUNITY): Payer: Medicare Other | Admitting: Hematology

## 2022-02-05 ENCOUNTER — Inpatient Hospital Stay (HOSPITAL_COMMUNITY): Payer: Medicare Other

## 2022-02-12 ENCOUNTER — Encounter: Payer: Self-pay | Admitting: Emergency Medicine

## 2022-02-12 ENCOUNTER — Observation Stay
Admission: EM | Admit: 2022-02-12 | Discharge: 2022-02-13 | Disposition: A | Discharge status: Home Health | Payer: Medicare Other | Attending: Internal Medicine | Admitting: Internal Medicine

## 2022-02-12 ENCOUNTER — Emergency Department: Payer: Medicare Other

## 2022-02-12 ENCOUNTER — Observation Stay: Payer: Medicare Other

## 2022-02-12 ENCOUNTER — Other Ambulatory Visit: Payer: Self-pay

## 2022-02-12 DIAGNOSIS — I10 Essential (primary) hypertension: Secondary | ICD-10-CM | POA: Diagnosis not present

## 2022-02-12 DIAGNOSIS — I639 Cerebral infarction, unspecified: Principal | ICD-10-CM | POA: Diagnosis present

## 2022-02-12 DIAGNOSIS — C9210 Chronic myeloid leukemia, BCR/ABL-positive, not having achieved remission: Secondary | ICD-10-CM | POA: Diagnosis not present

## 2022-02-12 DIAGNOSIS — G5632 Lesion of radial nerve, left upper limb: Secondary | ICD-10-CM

## 2022-02-12 DIAGNOSIS — G20 Parkinson's disease: Secondary | ICD-10-CM | POA: Insufficient documentation

## 2022-02-12 DIAGNOSIS — R29898 Other symptoms and signs involving the musculoskeletal system: Secondary | ICD-10-CM

## 2022-02-12 DIAGNOSIS — I4891 Unspecified atrial fibrillation: Secondary | ICD-10-CM | POA: Diagnosis not present

## 2022-02-12 DIAGNOSIS — Z20822 Contact with and (suspected) exposure to covid-19: Secondary | ICD-10-CM | POA: Insufficient documentation

## 2022-02-12 DIAGNOSIS — R531 Weakness: Secondary | ICD-10-CM | POA: Diagnosis present

## 2022-02-12 DIAGNOSIS — R2990 Unspecified symptoms and signs involving the nervous system: Secondary | ICD-10-CM

## 2022-02-12 DIAGNOSIS — Z79899 Other long term (current) drug therapy: Secondary | ICD-10-CM | POA: Diagnosis not present

## 2022-02-12 DIAGNOSIS — G20A1 Parkinson's disease without dyskinesia, without mention of fluctuations: Secondary | ICD-10-CM | POA: Diagnosis present

## 2022-02-12 LAB — DIFFERENTIAL
Abs Immature Granulocytes: 0.03 10*3/uL (ref 0.00–0.07)
Basophils Absolute: 0 10*3/uL (ref 0.0–0.1)
Basophils Relative: 1 %
Eosinophils Absolute: 0.1 10*3/uL (ref 0.0–0.5)
Eosinophils Relative: 3 %
Immature Granulocytes: 1 %
Lymphocytes Relative: 33 %
Lymphs Abs: 1.5 10*3/uL (ref 0.7–4.0)
Monocytes Absolute: 0.3 10*3/uL (ref 0.1–1.0)
Monocytes Relative: 7 %
Neutro Abs: 2.5 10*3/uL (ref 1.7–7.7)
Neutrophils Relative %: 55 %

## 2022-02-12 LAB — PROTIME-INR
INR: 1 (ref 0.8–1.2)
Prothrombin Time: 13.4 seconds (ref 11.4–15.2)

## 2022-02-12 LAB — RESP PANEL BY RT-PCR (FLU A&B, COVID) ARPGX2
Influenza A by PCR: NEGATIVE
Influenza B by PCR: NEGATIVE
SARS Coronavirus 2 by RT PCR: NEGATIVE

## 2022-02-12 LAB — COMPREHENSIVE METABOLIC PANEL
ALT: 11 U/L (ref 0–44)
AST: 20 U/L (ref 15–41)
Albumin: 3.8 g/dL (ref 3.5–5.0)
Alkaline Phosphatase: 68 U/L (ref 38–126)
Anion gap: 5 (ref 5–15)
BUN: 16 mg/dL (ref 8–23)
CO2: 26 mmol/L (ref 22–32)
Calcium: 9.2 mg/dL (ref 8.9–10.3)
Chloride: 106 mmol/L (ref 98–111)
Creatinine, Ser: 0.77 mg/dL (ref 0.61–1.24)
GFR, Estimated: >60 mL/min (ref >60)
Glucose, Bld: 167 mg/dL — ABNORMAL HIGH (ref 70–99)
Potassium: 4.2 mmol/L (ref 3.5–5.1)
Sodium: 137 mmol/L (ref 135–145)
Total Bilirubin: 0.8 mg/dL (ref 0.3–1.2)
Total Protein: 6.9 g/dL (ref 6.5–8.1)

## 2022-02-12 LAB — CBG MONITORING, ED: Glucose-Capillary: 150 mg/dL — ABNORMAL HIGH (ref 70–99)

## 2022-02-12 LAB — APTT: aPTT: 28 seconds (ref 24–36)

## 2022-02-12 LAB — CBC
HCT: 40.2 % (ref 39.0–52.0)
Hemoglobin: 13.3 g/dL (ref 13.0–17.0)
MCH: 31.4 pg (ref 26.0–34.0)
MCHC: 33.1 g/dL (ref 30.0–36.0)
MCV: 95 fL (ref 80.0–100.0)
Platelets: 226 10*3/uL (ref 150–400)
RBC: 4.23 MIL/uL (ref 4.22–5.81)
RDW: 13.2 % (ref 11.5–15.5)
WBC: 4.4 10*3/uL (ref 4.0–10.5)
nRBC: 0 % (ref 0.0–0.2)

## 2022-02-12 LAB — HEMOGLOBIN A1C
Hgb A1c MFr Bld: 5.8 % — ABNORMAL HIGH (ref 4.8–5.6)
Mean Plasma Glucose: 119.76 mg/dL

## 2022-02-12 MED ORDER — ACETAMINOPHEN 325 MG PO TABS: 650.0000 mg | ORAL_TABLET | ORAL | Status: DC | PRN

## 2022-02-12 MED ORDER — LATANOPROST 0.005 % OP SOLN
1.0000 [drp] | Freq: Every day | OPHTHALMIC | Status: DC
  Administered 2022-02-12: 1 [drp] via OPHTHALMIC
  Filled 2022-02-12: qty 2.5

## 2022-02-12 MED ORDER — IOHEXOL 350 MG/ML SOLN
75.0000 mL | Freq: Once | INTRAVENOUS | Status: AC | PRN
  Administered 2022-02-12: 75 mL via INTRAVENOUS
  Filled 2022-02-12: qty 75

## 2022-02-12 MED ORDER — MEMANTINE HCL 5 MG PO TABS
10.0000 mg | ORAL_TABLET | Freq: Two times a day (BID) | ORAL | Status: DC
  Administered 2022-02-12 – 2022-02-13 (×2): 10 mg via ORAL
  Filled 2022-02-12 (×2): qty 2

## 2022-02-12 MED ORDER — APIXABAN 5 MG PO TABS
5.0000 mg | ORAL_TABLET | Freq: Two times a day (BID) | ORAL | Status: DC
  Administered 2022-02-12 – 2022-02-13 (×2): 5 mg via ORAL
  Filled 2022-02-12 (×2): qty 1

## 2022-02-12 MED ORDER — AMIODARONE HCL 200 MG PO TABS
400.0000 mg | ORAL_TABLET | Freq: Every day | ORAL | Status: DC
Start: 2022-02-12 — End: 2022-02-13
  Administered 2022-02-12 – 2022-02-13 (×2): 400 mg via ORAL
  Filled 2022-02-12 (×2): qty 2

## 2022-02-12 MED ORDER — CLONAZEPAM 0.5 MG PO TABS
0.5000 mg | ORAL_TABLET | Freq: Every day | ORAL | Status: DC
  Administered 2022-02-12 – 2022-02-13 (×2): 0.5 mg via ORAL
  Filled 2022-02-12 (×2): qty 1

## 2022-02-12 MED ORDER — STROKE: EARLY STAGES OF RECOVERY BOOK: Freq: Once | Status: AC

## 2022-02-12 MED ORDER — ASPIRIN EC 81 MG PO TBEC: 81.0000 mg | DELAYED_RELEASE_TABLET | Freq: Every day | ORAL | Status: DC

## 2022-02-12 MED ORDER — CARBIDOPA-LEVODOPA 25-100 MG PO TABS: 1.0000 | ORAL_TABLET | Freq: Three times a day (TID) | ORAL | Status: DC

## 2022-02-12 MED ORDER — ADULT MULTIVITAMIN W/MINERALS CH
1.0000 | ORAL_TABLET | Freq: Every day | ORAL | Status: DC
Start: 2022-02-13 — End: 2022-02-13
  Administered 2022-02-13: 1 via ORAL
  Filled 2022-02-12: qty 1

## 2022-02-12 MED ORDER — GEMFIBROZIL 600 MG PO TABS
600.0000 mg | ORAL_TABLET | Freq: Two times a day (BID) | ORAL | Status: DC
  Administered 2022-02-13: 600 mg via ORAL
  Filled 2022-02-12 (×2): qty 1

## 2022-02-12 MED ORDER — TAMSULOSIN HCL 0.4 MG PO CAPS
0.4000 mg | ORAL_CAPSULE | Freq: Every day | ORAL | Status: DC
  Administered 2022-02-12: 0.4 mg via ORAL
  Filled 2022-02-12: qty 1

## 2022-02-12 MED ORDER — DASATINIB 20 MG PO TABS: 20.0000 mg | ORAL_TABLET | Freq: Every day | ORAL | Status: DC

## 2022-02-12 MED ORDER — ACETAMINOPHEN 650 MG RE SUPP: 650.0000 mg | RECTAL | Status: DC | PRN

## 2022-02-12 MED ORDER — ACETAMINOPHEN 160 MG/5ML PO SOLN
650.0000 mg | ORAL | Status: DC | PRN
  Filled 2022-02-12: qty 20.3

## 2022-02-12 MED ORDER — ICOSAPENT ETHYL 0.5 G PO CAPS: 1.0000 | ORAL_CAPSULE | Freq: Two times a day (BID) | ORAL | Status: DC

## 2022-02-12 MED ORDER — CLOPIDOGREL BISULFATE 75 MG PO TABS
75.0000 mg | ORAL_TABLET | Freq: Once | ORAL | Status: DC
Start: 2022-02-12 — End: 2022-02-12

## 2022-02-12 MED ORDER — CARBIDOPA-LEVODOPA 25-100 MG PO TABS
1.0000 | ORAL_TABLET | Freq: Four times a day (QID) | ORAL | Status: DC
  Administered 2022-02-12 – 2022-02-13 (×4): 1 via ORAL
  Filled 2022-02-12 (×6): qty 1

## 2022-02-12 NOTE — Assessment & Plan Note (Addendum)
MRI of the brain did not show acute changes.  Patient most likely had a TIA episode.  Given new onset of atrial fibrillation, patient will be treated with Eliquis. ?Patient also had LDH of 95, will start at however starting. ?Patient was scheduled to have echocardiogram here, but could not perform due to tech schedule.  Patient does not want to stay in the hospital for another day.  At this point, echocardiogram can be performed as outpatient with his cardiologist. ?

## 2022-02-12 NOTE — ED Notes (Signed)
See triage note  presents with some left arm numbness  states he woke up with this ..grip is slightly weaker on the left states his wife had a hard time getting in to bed last night  ?

## 2022-02-12 NOTE — Consult Note (Signed)
?Kevin Love is a 76 y.o. male  [id number] ? ?Primary Cardiologist: Neoma Laming, MD ?Reason for Consultation: CVA, new onset atrial fibrillation ? ?HPI: Patient presented to ED today complaining of new onset left arm numbness and weakness. Spouse reports patient fell asleep in living room last night, she had a difficult time waking him and moving him to the bed. Patient difficult to arouse this morning per spouse. When patient woke, he complained of left arm numbness and weakness.  ? ? ?Review of Systems: Left arm numbness and weakness. Denies chest pain, shortness of breath.  ? ? ?Past Medical History:  ?Diagnosis Date  ? Parkinson disease ([place]) 07/25/2020  ? ? ?(Not in a hospital admission) ? ? ? ? ? ?Infusions: ? ? ?Allergies  ?Allergen Reactions  ? Penicillins Rash  ? ? ?Social History  ? ?Socioeconomic History  ? Marital status: Married  ?  Spouse name: Not on file  ? Number of children: Not on file  ? Years of education: Not on file  ? Highest education level: Not on file  ?Occupational History  ? Not on file  ?Tobacco Use  ? Smoking status: Never  ? Smokeless tobacco: Never  ?Substance and Sexual Activity  ? Alcohol use: Never  ? Drug use: Never  ? Sexual activity: Not Currently  ?Other Topics Concern  ? Not on file  ?Social History Narrative  ? Not on file  ? ?Social Determinants of Health  ? ?Financial Resource Strain: Not on file  ?Food Insecurity: Not on file  ?Transportation Needs: Not on file  ?Physical Activity: Not on file  ?Stress: Not on file  ?Social Connections: Not on file  ?Intimate Partner Violence: Not on file  ? ? ?History reviewed. No pertinent family history. ? ?PHYSICAL EXAM: ?Vitals:  ? 02/12/22 1121 02/12/22 1243  ?BP: (!) 133/56 134/60  ?Pulse: 71 68  ?Resp: 18 18  ?Temp:  98 ?F (36.7 ?C)  ?SpO2: 96% 96%  ? ? ?No intake or output data in the 24 hours ending 02/12/22 1249 ? ?General:  Well appearing. No respiratory difficulty ?HEENT: normal ?Neck: supple. no JVD. Carotids 2+ bilat; no  bruits. No lymphadenopathy or thryomegaly appreciated. ?Cor: PMI nondisplaced. Regular rate & rhythm. No rubs, gallops or murmurs. ?Lungs: clear ?Abdomen: soft, nontender, nondistended. No hepatosplenomegaly. No bruits or masses. Good bowel sounds. ?Extremities: no cyanosis, clubbing, rash, edema ?Neuro: alert & oriented x 3, cranial nerves grossly intact. moves all 4 extremities w/o difficulty. Affect pleasant. ? ?ECG: atrial fibrillation, HR 67 bpm, LBBB ? ?Results for orders placed or performed during the hospital encounter of 02/12/22 (from the past 24 hour(s))  ?Protime-INR     Status: None  ? Collection Time: 02/12/22 10:08 AM  ?Result Value Ref Range  ? Prothrombin Time 13.4 11.4 - 15.2 seconds  ? INR 1.0 0.8 - 1.2  ?APTT     Status: None  ? Collection Time: 02/12/22 10:08 AM  ?Result Value Ref Range  ? aPTT 28 24 - 36 seconds  ?CBC     Status: None  ? Collection Time: 02/12/22 10:08 AM  ?Result Value Ref Range  ? WBC 4.4 4.0 - 10.5 K/uL  ? RBC 4.23 4.22 - 5.81 MIL/uL  ? Hemoglobin 13.3 13.0 - 17.0 g/dL  ? HCT 40.2 39.0 - 52.0 %  ? MCV 95.0 80.0 - 100.0 fL  ? MCH 31.4 26.0 - 34.0 pg  ? MCHC 33.1 30.0 - 36.0 g/dL  ? RDW 13.2 11.5 - 15.5 %  ?  Platelets 226 150 - 400 K/uL  ? nRBC 0.0 0.0 - 0.2 %  ?Differential     Status: None  ? Collection Time: 02/12/22 10:08 AM  ?Result Value Ref Range  ? Neutrophils Relative % 55 %  ? Neutro Abs 2.5 1.7 - 7.7 K/uL  ? Lymphocytes Relative 33 %  ? Lymphs Abs 1.5 0.7 - 4.0 K/uL  ? Monocytes Relative 7 %  ? Monocytes Absolute 0.3 0.1 - 1.0 K/uL  ? Eosinophils Relative 3 %  ? Eosinophils Absolute 0.1 0.0 - 0.5 K/uL  ? Basophils Relative 1 %  ? Basophils Absolute 0.0 0.0 - 0.1 K/uL  ? Immature Granulocytes 1 %  ? Abs Immature Granulocytes 0.03 0.00 - 0.07 K/uL  ?Comprehensive metabolic panel     Status: Abnormal  ? Collection Time: 02/12/22 10:08 AM  ?Result Value Ref Range  ? Sodium 137 135 - 145 mmol/L  ? Potassium 4.2 3.5 - 5.1 mmol/L  ? Chloride 106 98 - 111 mmol/L  ? CO2 26  22 - 32 mmol/L  ? Glucose, Bld 167 (H) 70 - 99 mg/dL  ? BUN 16 8 - 23 mg/dL  ? Creatinine, Ser 0.77 0.61 - 1.24 mg/dL  ? Calcium 9.2 8.9 - 10.3 mg/dL  ? Total Protein 6.9 6.5 - 8.1 g/dL  ? Albumin 3.8 3.5 - 5.0 g/dL  ? AST 20 15 - 41 U/L  ? ALT 11 0 - 44 U/L  ? Alkaline Phosphatase 68 38 - 126 U/L  ? Total Bilirubin 0.8 0.3 - 1.2 mg/dL  ? GFR, Estimated >60 >60 mL/min  ? Anion gap 5 5 - 15  ?CBG monitoring, ED     Status: Abnormal  ? Collection Time: 02/12/22 10:14 AM  ?Result Value Ref Range  ? Glucose-Capillary 150 (H) 70 - 99 mg/dL  ?Resp Panel by RT-PCR (Flu A&B, Covid) Nasopharyngeal Swab     Status: None  ? Collection Time: 02/12/22 11:18 AM  ? Specimen: Nasopharyngeal Swab; Nasopharyngeal(NP) swabs in vial transport medium  ?Result Value Ref Range  ? SARS Coronavirus 2 by RT PCR NEGATIVE NEGATIVE  ? Influenza A by PCR NEGATIVE NEGATIVE  ? Influenza B by PCR NEGATIVE NEGATIVE  ? ?CT ANGIO HEAD NECK W WO CM ? ?Result Date: 02/12/2022 ?CLINICAL DATA:  Stroke/TIA, determine embolic source; slurred speech and left arm numbness EXAM: CT ANGIOGRAPHY HEAD AND NECK TECHNIQUE: Multidetector CT imaging of the head and neck was performed using the standard protocol during bolus administration of intravenous contrast. Multiplanar CT image reconstructions and MIPs were obtained to evaluate the vascular anatomy. Carotid stenosis measurements (when applicable) are obtained utilizing NASCET criteria, using the distal internal carotid diameter as the denominator. RADIATION DOSE REDUCTION: This exam was performed according to the departmental dose-optimization program which includes automated exposure control, adjustment of the mA and/or kV according to patient size and/or use of iterative reconstruction technique. CONTRAST:  59m OMNIPAQUE IOHEXOL 350 MG/ML SOLN COMPARISON:  None. FINDINGS: CTA NECK Aortic arch: Mixed plaque along the included arch and patent great vessel origins. No high-grade proximal subclavian stenosis.  Right carotid system: Patent. Mixed plaque along the proximal ICA with less than 50% stenosis. Left carotid system: Patent. Mixed plaque along the common carotid with less than 50% stenosis. Primarily calcified plaque along the proximal internal carotid with less than 50% stenosis. Mixed plaque along the distal cervical ICA with less than 50% stenosis. Vertebral arteries: Right vertebral artery is patent. Noncalcified plaque at the left vertebral origin with  marked stenosis. The remainder of the extracranial vertebral artery is irregular with multifocal stenoses. Skeleton: Cervical spine degenerative changes. Other neck: Unremarkable. Upper chest: Mild centrilobular emphysema. Review of the MIP images confirms the above findings CTA HEAD Anterior circulation: Intracranial internal carotid arteries are patent with minimal calcified plaque. Anterior and middle cerebral arteries are patent. Anterior communicating artery is present. Posterior circulation: Intracranial vertebral arteries are patent. The left vertebral is small in caliber proximally with calcified plaque. Improved caliber from the level of the PICA origin may be due to retrograde flow. Basilar artery is patent. Major cerebellar artery origins are patent. Bilateral posterior communicating arteries are present. Posterior cerebral arteries are patent. Venous sinuses: Patent as allowed by contrast bolus timing. Review of the MIP images confirms the above findings IMPRESSION: No large vessel occlusion. Plaque along the common and internal carotids with less than 50% stenosis. Marked stenosis at the left vertebral origin. Remainder of extracranial vertebral artery is irregular with multifocal stenosis. Favored to reflect atherosclerosis over dissection. Intracranially, improved caliber beyond the PICA origin may be due to retrograde flow. Emphysema (ICD10-J43.9). Electronically Signed   By: Macy Mis M.D.   On: 02/12/2022 12:02  ? ?CT HEAD WO  CONTRAST ? ?Result Date: 02/12/2022 ?CLINICAL DATA:  Neurological deficit, slurred speech, left arm numbness EXAM: CT HEAD WITHOUT CONTRAST TECHNIQUE: Contiguous axial images were obtained from the base of the skull thr

## 2022-02-12 NOTE — ED Triage Notes (Signed)
Pt comes into the ED via POV c/o slurred speech and left arm numbness.  Per the wife, he started acting differently last night, but then this morning he woke up with the symptoms.  PT in NAD at this time with even and unlabored respirations.  ?

## 2022-02-12 NOTE — H&P (Addendum)
?History and Physical  ? ? ?Patient: Kevin Love AVW:[id number] DOB: [birth year] ?DOA: 02/12/2022 ?DOS: the patient was seen and examined on 02/12/2022 ?PCP: Jacqualine Code, DO  ?Patient coming from: Home ? ?Chief Complaint:  ?Chief Complaint  ?Patient presents with  ? Aphasia  ?   ?  ? Weakness  ? ?HPI: Kevin Love is a 76 y.o. male with medical history significant for Parkinson's disease, CML, BPH who presented to the ER via private vehicle for evaluation of slurred speech and left weakness/numbness. ?Patient's wife who provides most of the history states that the night prior to his hospitalization she noted he had difficulty ambulating which seem to be out of place for him.  She was able to get him in bed and checked on him several times during the night and he appeared to be okay.  He woke up this morning with weakness and inability to use his left arm and his wife states that his speech was slurred when he first woke up. ?He denies having any headache, no blurred vision, no difficulty swallowing, no double vision, no chest pain, no shortness of breath, no dizziness, no lightheadedness, no focal deficit fever or chills. ?He was able to ambulate to the car and his wife brought him to the hospital. ?Review of Systems: As mentioned in the history of present illness. All other systems reviewed and are negative. ?Past Medical History:  ?Diagnosis Date  ? Parkinson disease ([place]) 07/25/2020  ? ?History reviewed. No pertinent surgical history. ?Social History:  reports that he has never smoked. He has never used smokeless tobacco. He reports that he does not drink alcohol and does not use drugs. ? ?Allergies  ?Allergen Reactions  ? Penicillins Rash  ? ? ?History reviewed. No pertinent family history. ? ?Prior to Admission medications   ?Medication Sig Start Date End Date Taking? Authorizing Provider  ?ascorbic acid (VITAMIN C) 500 MG tablet daily 12/21/05   [provider]  ?B Complex Vitamins (RA B-COMPLEX)  TABS     [provider]  ?carbidopa-levodopa (SINEMET IR) 25-100 MG tablet Take by mouth.    [provider]  ?Cholecalciferol 50 MCG (2000 UT) CAPS  07/22/20   [provider]  ?clonazePAM (KLONOPIN) 0.5 MG tablet 0.5 mg.    [provider]  ?dasatinib (SPRYCEL) 20 MG tablet Take 1 tablet (20 mg total) by mouth daily. 11/07/21   Derek Jack, MD  ?gemfibrozil (LOPID) 600 MG tablet gemfibrozil 600 mg tablet    [provider]  ?ibuprofen (ADVIL) 200 MG tablet Take by mouth.    [provider]  ?Icosapent Ethyl (VASCEPA) 0.5 g CAPS Take 1 capsule by mouth 2 (two) times daily.    [provider]  ?latanoprost (XALATAN) 0.005 % ophthalmic solution  02/12/20   [provider]  ?lisinopril (ZESTRIL) 10 MG tablet  02/12/20   [provider]  ?memantine (NAMENDA) 10 MG tablet Take 10 mg by mouth 2 (two) times daily.    [provider]  ?Multiple Vitamins-Minerals (CENTRUM SILVER 50+MEN PO) [place] Silver ? daily    [provider]  ?potassium chloride SA (KLOR-CON) 20 MEQ tablet 10 mEq. ?Patient not taking: Reported on 11/06/2021    [provider]  ?PRED FORTE 1 % ophthalmic suspension Place 1 drop into the right eye 3 (three) times daily. 04/07/21   [provider]  ?prochlorperazine (COMPAZINE) 10 MG tablet Take 1 tablet (10 mg total) by mouth every 6 (six) hours as needed for  nausea or vomiting. 08/14/20   Derek Jack, MD  ?tamsulosin (FLOMAX) 0.4 MG CAPS capsule Take by mouth.    [provider]  ?vitamin E 28000 units external oil Apply topically daily.    [provider]  ? ? ?Physical Exam: ?Vitals:  ? 02/12/22 1018 02/12/22 1121 02/12/22 1243 02/12/22 1428  ?BP: (!) 137/56 (!) 133/56 134/60 133/68  ?Pulse: 65 71 68 66  ?Resp: '17 18 18 18  '$ ?Temp: (!) 97.4 ?F (36.3 ?C)  98 ?F (36.7 ?C)   ?TempSrc: Oral  Oral   ?SpO2: 96% 96% 96% 96%  ?Weight:      ?Height:       ? ?Physical Exam ?Vitals and nursing note reviewed.  ?Constitutional:   ?   Appearance: Normal appearance.  ?HENT:  ?   Head: Normocephalic and atraumatic.  ?   Nose: Nose normal.  ?   Mouth/Throat:  ?   Mouth: Mucous membranes are moist.  ?Eyes:  ?   Pupils: Pupils are equal, round, and reactive to light.  ?Cardiovascular:  ?   Rate and Rhythm: Rhythm irregular.  ?Pulmonary:  ?   Effort: Pulmonary effort is normal.  ?   Breath sounds: Normal breath sounds.  ?Abdominal:  ?   General: Abdomen is flat. Bowel sounds are normal.  ?   Palpations: Abdomen is soft.  ?Musculoskeletal:     ?   General: Normal range of motion.  ?   Cervical back: Normal range of motion and neck supple.  ?Skin: ?   General: Skin is warm and dry.  ?Neurological:  ?   General: No focal deficit present.  ?   Mental Status: He is alert and oriented to person, place, and time.  ?Psychiatric:     ?   Mood and Affect: Mood normal.     ?   Behavior: Behavior normal.  ? ? ?Data Reviewed: ?Relevant notes from primary care and specialist visits, past discharge summaries as available in EHR, including Care Everywhere. ?Prior diagnostic testing as pertinent to current admission diagnoses ?Updated medications and problem lists for reconciliation ?ED course, including vitals, labs, imaging, treatment and response to treatment ?Triage notes, nursing and pharmacy notes and ED provider's notes ?Notable results as noted in HPI ?Labs reviewed and essentially negative except for glucose of 167 ?CT scan of the head without contrast shows No acute intracranial findings are seen.  Atrophy. Chronic sinusitis. ?CT angiogram of the head and neck shows no large vessel occlusion. Plaque along the common and internal carotids with less than 50% stenosis. Marked stenosis at the left vertebral origin. Remainder of ?extracranial vertebral artery is irregular with multifocal stenosis. Favored to reflect atherosclerosis over dissection. Intracranially, improved caliber beyond  the PICA origin may be due to retrograde flow. ?Emphysema. ?Twelve-lead EKG reviewed by me shows A-fib with a left bundle branch block. ?There are no new results to review at this time. ? ?Assessment and Plan: ?* CVA (cerebral vascular accident) ([place]) ?Patient presents for evaluation of left arm weakness/numbness associated with slurred speech which has improved since presentation. ?Noted to have new onset A-fib ?Initial CT scan of the head without contrast is negative an acute stroke ?We will obtain MRI of the brain to rule out an acute infarct ?Obtain 2D echocardiogram to assess LVEF and rule out cardiac thrombus ?We will request PT/OT/ST consult ?Consult neurology ? ?Atrial fibrillation ([place]) ?New onset ?Patient has a CHA2DS2-VASc score of 5 and ideally requires anticoagulation as primary prophylaxis for an  acute stroke. ?Awaiting results of MRI of the brain to make a decision regarding anticoagulation therapy ?We will consult cardiology ?Follow-up results of 2D echocardiogram to assess LVEF and rule out valvular pathology ? ?Parkinson disease ([place]) ?Stable ?Continue Sinemet ? ?Hypertension ?Allow for permissive hypertension until acute stroke is ruled out ?Hold lisinopril ? 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 ? ? ? ? Advance Care Planning:   Code Status: Full Code  ? ?Consults: Cardiology, neurology ? ?Family Communication: Greater than 50% of time was spent discussing patient's condition and plan of care with him and his wife at the bedside.  All questions and concerns have been addressed.  They verbalized understanding and agree with the plan. ? ?Severity of Illness: ?The appropriate patient status for this patient is OBSERVATION. Observation status is judged to be reasonable and necessary in order to provide the required intensity of service to ensure the patient's safety. The patient's presenting symptoms, physical exam findings, and initial radiographic and laboratory data  in the context of their medical condition is felt to place them at decreased risk for further clinical deterioration. Furthermore, it is anticipated that the patient will be medically stable for discharge from t

## 2022-02-12 NOTE — Assessment & Plan Note (Addendum)
Resume lisinopril

## 2022-02-12 NOTE — ED Notes (Signed)
Back from MRI  family at bedside   ?

## 2022-02-12 NOTE — Progress Notes (Signed)
Pt arrived to room 101 via bed from the ED. Received report from [place], [place]. See assessment. Will continue to monitor.  ?

## 2022-02-12 NOTE — Assessment & Plan Note (Addendum)
New onset ?Patient has a CHA2DS2-VASc score of 5. ?Patient is seen by cardiology, will start Eliquis. ?Patient has his own cardiologist, advised to follow-up with him in the near future in 2 weeks ?

## 2022-02-12 NOTE — Progress Notes (Signed)
Patient started on Eliquis 5 mg twice daily at the recommendation of neurology and oncology for new onset A-fib.  He has a CHA2DS2-VASc score of 5 ?

## 2022-02-12 NOTE — Assessment & Plan Note (Addendum)
Continue Sinemet ?

## 2022-02-12 NOTE — ED Notes (Signed)
[place] sent to the lab.  

## 2022-02-12 NOTE — ED Provider Notes (Signed)
? ?[facility] ?Provider Note ? ? ? Event Date/Time  ? First MD Initiated Contact with Patient 02/12/22 1044   ?  (approximate) ? ? ?History  ? ?Aphasia (/) and Weakness ? ? ?HPI ? ?Kevin Love is a 76 y.o. male with past medical history of Parkinson's here with left-sided arm numbness.  The patient states his last known normal was last night.  He states when he woke up after a nap, he felt very weak when walking to the bed.  When he woke up this morning, he had significant weakness in his left arm.  This is new.  He has no weakness that he is aware of last night, only difficulty walking and some slight confusion per wife.  No history of similar symptoms.  No neck pain.  No recent falls.  No fevers or chills.  No headache.  No vision changes.  No speech difficulty.  Denies history of stroke.  He recently moved here from [place], does not have a neurologist in the area yet.  No recent changes in his Parkinson's medication.  No seizures. ?  ? ? ?Physical Exam  ? ?Triage Vital Signs: ?ED Triage Vitals  ?Enc Vitals Group  ?   BP 02/12/22 1018 (!) 137/56  ?   Pulse Rate 02/12/22 1018 65  ?   Resp 02/12/22 1018 17  ?   Temp 02/12/22 1018 (!) 97.4 ?F (36.3 ?C)  ?   Temp Source 02/12/22 1018 Oral  ?   SpO2 02/12/22 1018 96 %  ?   Weight 02/12/22 1007 163 lb 9.3 oz (74.2 kg)  ?   Height 02/12/22 1007 '5\' 5"'$  (1.651 m)  ?   Head Circumference --   ?   Peak Flow --   ?   Pain Score 02/12/22 1006 0  ?   Pain Loc --   ?   Pain Edu? --   ?   Excl. in [place]? --   ? ? ?Most recent vital signs: ?Vitals:  ? 02/12/22 1503 02/12/22 2040  ?BP: (!) 157/60 (!) 124/55  ?Pulse: 64 65  ?Resp: 20 16  ?Temp: 97.7 ?F (36.5 ?C) 97.9 ?F (36.6 ?C)  ?SpO2: 98% 95%  ? ? ? ?General: Awake, no distress.  ?CV:  Good peripheral perfusion.  Regular rate and rhythm. ?Resp:  Normal effort.  Lungs clear bilaterally. ?Abd:  No distention.  No tenderness. ?Other:  ? ?Neurological Exam:  ?Mental Status: Alert and oriented to person,  place, and time. Attention and concentration normal. Speech clear. Recent memory is intact. ?Cranial Nerves: Visual fields grossly intact. EOMI, right pupil reactive, left eye has prosthetic in place. No nystagmus noted. Facial sensation intact at forehead, maxillary cheek, and chin/mandible bilaterally. No facial asymmetry or weakness. Hearing grossly normal. Uvula is midline, and palate elevates symmetrically. Normal SCM and trapezius strength. Tongue midline without fasciculations. ?Motor: Muscle strength 4 out of 5 in left upper extremity with pronator drift, 5 5 and right upper and bilateral lower extremities. No pronator drift. Muscle tone normal. ?Sensation: Intact to light touch in upper and lower extremities distally bilaterally.  ?Gait: Deferred ?Coordination: Normal FTN bilaterally. ? ? ? ? ?ED Results / Procedures / Treatments  ? ?Labs ?(all labs ordered are listed, but only abnormal results are displayed) ?Labs Reviewed  ?COMPREHENSIVE METABOLIC PANEL - Abnormal; Notable for the following components:  ?    Result Value  ? Glucose, Bld 167 (*)   ? All other components within normal  limits  ?HEMOGLOBIN A1C - Abnormal; Notable for the following components:  ? Hgb A1c MFr Bld 5.8 (*)   ? All other components within normal limits  ?CBG MONITORING, ED - Abnormal; Notable for the following components:  ? Glucose-Capillary 150 (*)   ? All other components within normal limits  ?RESP PANEL BY RT-PCR (FLU A&B, COVID) ARPGX2  ?PROTIME-INR  ?APTT  ?CBC  ?DIFFERENTIAL  ?LIPID PANEL  ? ? ? ?EKG ?Atrial fibrillation, ventricular rate 67.  QRS 148, QTc 460.  No acute ST elevations. ? ? ?RADIOLOGY ?CT head: No acute intracranial abnormality ? ? ?I also independently reviewed and agree wit radiologist interpretations. ? ? ?PROCEDURES: ? ?Critical Care performed: No ? ?Procedures ? ? ? ?MEDICATIONS ORDERED IN ED: ?Medications  ?dasatinib (SPRYCEL) tablet 20 mg (has no administration in time range)  ?gemfibrozil (LOPID)  tablet 600 mg (has no administration in time range)  ?memantine [facility]) tablet 10 mg (10 mg Oral Given 02/12/22 2051)  ?tamsulosin (FLOMAX) capsule 0.4 mg (0.4 mg Oral Given 02/12/22 1709)  ?clonazePAM (KLONOPIN) tablet 0.5 mg (0.5 mg Oral Given 02/12/22 1709)  ?multivitamin with minerals tablet 1 tablet (has no administration in time range)  ?latanoprost (XALATAN) 0.005 % ophthalmic solution 1 drop (1 drop Both Eyes Given 02/12/22 2056)  ?acetaminophen (TYLENOL) tablet 650 mg (has no administration in time range)  ?  Or  ?acetaminophen (TYLENOL) 160 MG/5ML solution 650 mg (has no administration in time range)  ?  Or  ?acetaminophen (TYLENOL) suppository 650 mg (has no administration in time range)  ?amiodarone (PACERONE) tablet 400 mg (400 mg Oral Given 02/12/22 1709)  ?carbidopa-levodopa (SINEMET IR) 25-100 MG per tablet immediate release 1 tablet (1 tablet Oral Given 02/12/22 2051)  ?apixaban (ELIQUIS) tablet 5 mg (5 mg Oral Given 02/12/22 2052)  ?iohexol (OMNIPAQUE) 350 MG/ML injection 75 mL (75 mLs Intravenous Contrast Given 02/12/22 1132)  ? stroke: early stages of recovery book ( Does not apply Given 02/12/22 1514)  ? ? ? ?IMPRESSION / MDM / ASSESSMENT AND PLAN / ED COURSE  ?I reviewed the triage vital signs and the nursing notes. ?             ?               ? ? ?MDM:  ?76 yo M with h/o Parkinson's, CML, BPH, here with L arm weakness. LKN was last night, though pt reportedly was "unsteady" when going to bed. Exam as above, remarkable for LUE weakness most notably. CT head reviewed by me, and is unremarkable. Pt received ASA from wife prior to arrival. Labs reviewed, show normal CBC, CMP. EKG shows what appears to be afib. No known h/o Afib. ? ?Given focal weakness with new-onset Afib, concern for CVA. Discussed with Neurology - will order CT Angion, MR, admit to medicine. Hold on MR C Spine at this time pending imaging. Denies any neck pain or sx to suggest cervical pathology. Pt, wife updated and in agreement.  Dr. Quinn Axe with Neurology consulted, Dr. Francine Graven with hospitalist to admit. ? ? ?MEDICATIONS GIVEN IN ED: ?Medications  ?dasatinib (SPRYCEL) tablet 20 mg (has no administration in time range)  ?gemfibrozil (LOPID) tablet 600 mg (has no administration in time range)  ?memantine [facility]) tablet 10 mg (10 mg Oral Given 02/12/22 2051)  ?tamsulosin (FLOMAX) capsule 0.4 mg (0.4 mg Oral Given 02/12/22 1709)  ?clonazePAM (KLONOPIN) tablet 0.5 mg (0.5 mg Oral Given 02/12/22 1709)  ?multivitamin with minerals tablet 1 tablet (has no  administration in time range)  ?latanoprost (XALATAN) 0.005 % ophthalmic solution 1 drop (1 drop Both Eyes Given 02/12/22 2056)  ?acetaminophen (TYLENOL) tablet 650 mg (has no administration in time range)  ?  Or  ?acetaminophen (TYLENOL) 160 MG/5ML solution 650 mg (has no administration in time range)  ?  Or  ?acetaminophen (TYLENOL) suppository 650 mg (has no administration in time range)  ?amiodarone (PACERONE) tablet 400 mg (400 mg Oral Given 02/12/22 1709)  ?carbidopa-levodopa (SINEMET IR) 25-100 MG per tablet immediate release 1 tablet (1 tablet Oral Given 02/12/22 2051)  ?apixaban (ELIQUIS) tablet 5 mg (5 mg Oral Given 02/12/22 2052)  ?iohexol (OMNIPAQUE) 350 MG/ML injection 75 mL (75 mLs Intravenous Contrast Given 02/12/22 1132)  ? stroke: early stages of recovery book ( Does not apply Given 02/12/22 1514)  ? ? ? ?Consults:  ?Dr. Quinn Axe - Neurology ?Dr. Francine Graven - Hospitalist ? ?EMR reviewed  ?Cancer center notes from 10/20[facility] at [facility] reviewed ? ? ? ? ?FINAL CLINICAL IMPRESSION(S) / ED DIAGNOSES  ? ?Final diagnoses:  ?Left arm weakness  ?Stroke-like symptoms  ?New onset atrial fibrillation ([place])  ? ? ? ?Rx / DC Orders  ? ?ED Discharge Orders   ? ? None  ? ?  ? ? ? ?Note:  This document was prepared using Dragon voice recognition software and may include unintentional dictation errors. ?  ?Duffy Bruce, MD ?02/12/22 2247 ? ?

## 2022-02-12 NOTE — Assessment & Plan Note (Addendum)
?  Continue Dasatinib ?

## 2022-02-12 NOTE — Plan of Care (Signed)
?  Problem: Education: Goal: Knowledge of General Education information will improve Description: Including pain rating scale, medication(s)/side effects and non-pharmacologic comfort measures Outcome: Progressing   Problem: Health Behavior/Discharge Planning: Goal: Ability to manage health-related needs will improve Outcome: Progressing   Problem: Clinical Measurements: Goal: Respiratory complications will improve Outcome: Progressing Goal: Cardiovascular complication will be avoided Outcome: Progressing   Problem: Pain Managment: Goal: General experience of comfort will improve Outcome: Progressing   Problem: Safety: Goal: Ability to remain free from injury will improve Outcome: Progressing   Problem: Skin Integrity: Goal: Risk for impaired skin integrity will decrease Outcome: Progressing   

## 2022-02-13 ENCOUNTER — Ambulatory Visit: Admit: 2022-02-13 | Payer: Medicare Other

## 2022-02-13 ENCOUNTER — Other Ambulatory Visit: Payer: Self-pay

## 2022-02-13 DIAGNOSIS — I4891 Unspecified atrial fibrillation: Secondary | ICD-10-CM | POA: Diagnosis not present

## 2022-02-13 DIAGNOSIS — I639 Cerebral infarction, unspecified: Secondary | ICD-10-CM | POA: Diagnosis not present

## 2022-02-13 DIAGNOSIS — C9210 Chronic myeloid leukemia, BCR/ABL-positive, not having achieved remission: Secondary | ICD-10-CM | POA: Diagnosis not present

## 2022-02-13 LAB — LIPID PANEL
Cholesterol: 153 mg/dL (ref 0–200)
HDL: 30 mg/dL — ABNORMAL LOW (ref >40)
LDL Cholesterol: 95 mg/dL (ref 0–99)
Total CHOL/HDL Ratio: 5.1 RATIO
Triglycerides: 139 mg/dL (ref <150)
VLDL: 28 mg/dL (ref 0–40)

## 2022-02-13 MED ORDER — AMIODARONE HCL 400 MG PO TABS: 400.0000 mg | ORAL_TABLET | Freq: Every day | ORAL | 0 refills | Status: DC

## 2022-02-13 MED ORDER — ATORVASTATIN CALCIUM 80 MG PO TABS
80.0000 mg | ORAL_TABLET | Freq: Every day | ORAL | 0 refills | Status: AC
Start: 2022-02-14 — End: ?

## 2022-02-13 MED ORDER — ATORVASTATIN CALCIUM 20 MG PO TABS
80.0000 mg | ORAL_TABLET | Freq: Every day | ORAL | Status: DC
Start: 2022-02-13 — End: 2022-02-13
  Administered 2022-02-13: 80 mg via ORAL
  Filled 2022-02-13: qty 4

## 2022-02-13 MED ORDER — APIXABAN 5 MG PO TABS: 5.0000 mg | ORAL_TABLET | Freq: Two times a day (BID) | ORAL | 0 refills | Status: AC

## 2022-02-13 MED ORDER — APIXABAN 5 MG PO TABS: 5.0000 mg | ORAL_TABLET | Freq: Two times a day (BID) | ORAL | 0 refills | Status: DC

## 2022-02-13 MED ORDER — ATORVASTATIN CALCIUM 80 MG PO TABS: 80.0000 mg | ORAL_TABLET | Freq: Every day | ORAL | 0 refills | Status: DC

## 2022-02-13 NOTE — Discharge Summary (Signed)
?Physician Discharge Summary ?  ?Patient: Kevin Love MRN: [id number] DOB: [birth year]  ?Admit date:     02/12/2022  ?Discharge date: 02/13/22  ?Discharge Physician: Sharen Hones  ? ?PCP: Jacqualine Code, DO  ? ?Recommendations at discharge:  ? ?Follow-up with PCP in 1 week. ?Follow-up with your own cardiologist in 2 weeks and performed echocardiogram. ?Follow-up with another neurologist in 1 month ? ?Discharge Diagnoses: ?Principal Problem: ?  CVA (cerebral vascular accident) ([place]) ?Active Problems: ?  New onset atrial fibrillation ([place]) ?  Parkinson disease ([place]) ?  Hypertension ?  CML (chronic myelocytic leukemia) ([place]) ? ?Resolved Problems: ?  * No resolved hospital problems. * ? ?Hospital Course: ?Kevin Love is a 76 y.o. male with medical history significant for Parkinson's disease, CML, BPH who presented to the ER via private vehicle for evaluation of slurred speech and left weakness/numbness.  MRI of the brain did not show acute changes.  EKG also showed atrial fibrillation.  Patient is seen by cardiology and neurology.  He will be treated with Eliquis and Lipitor. ?Symptom had improved, patient is medically stable to be discharged ? ?Assessment and Plan: ?* CVA (cerebral vascular accident) ([place]) ?MRI of the brain did not show acute changes.  Patient most likely had a TIA episode.  Given new onset of atrial fibrillation, patient will be treated with Eliquis. ?Patient also had LDH of 95, will start at however starting. ?Patient was scheduled to have echocardiogram here, but could not perform due to tech schedule.  Patient does not want to stay in the hospital for another day.  At this point, echocardiogram can be performed as outpatient with his cardiologist. ? ?New onset atrial fibrillation ([place]) ?New onset ?Patient has a CHA2DS2-VASc score of 5. ?Patient is seen by cardiology, will start Eliquis. ?Patient has his own cardiologist, advised to follow-up with him in the near future in 2 weeks ? ?Parkinson  disease ([place]) ?Continue Sinemet. ? ?Hypertension ?Resume lisinopril ? ?CML (chronic myelocytic leukemia) ([place]) ? ?Continue Dasatinib ? ? ? ? ?  ? ? ?Consultants: Cardiology and neurology. ?Procedures performed: None  ?Disposition: Home health ?Diet recommendation:  ?Discharge Diet Orders (From admission, onward)  ? ?  Start     Ordered  ? 02/13/22 0000  Diet - low sodium heart healthy       ? 02/13/22 1450  ? ?  ?  ? ?  ? ?Cardiac diet ?DISCHARGE MEDICATION: ?Allergies as of 02/13/2022   ? ?   Reactions  ? Penicillins Rash  ? ?  ? ?  ?Medication List  ?  ? ?STOP taking these medications   ? ?ascorbic acid 500 MG tablet ?Commonly known as: VITAMIN C ?  ?gemfibrozil 600 MG tablet ?Commonly known as: LOPID ?  ?ibuprofen 200 MG tablet ?Commonly known as: ADVIL ?  ?memantine 10 MG tablet ?Commonly known as: NAMENDA ?  ?potassium chloride SA 20 MEQ tablet ?Commonly known as: KLOR-CON M ?  ?Pred Forte 1 % ophthalmic suspension ?Generic drug: prednisoLONE acetate ?  ?prochlorperazine 10 MG tablet ?Commonly known as: COMPAZINE ?  ?RA B-Complex Tabs ?  ?Vascepa 0.5 g Caps ?Generic drug: Icosapent Ethyl ?  ?vitamin E 28000 units external oil ?  ? ?  ? ?TAKE these medications   ? ?amiodarone 400 MG tablet ?Commonly known as: PACERONE ?Take 1 tablet (400 mg total) by mouth daily. ?Start taking on: February 14, 2022 ?  ?apixaban 5 MG Tabs tablet ?Commonly known as: ELIQUIS ?Take 1 tablet (5 mg  total) by mouth 2 (two) times daily. ?  ?atorvastatin 80 MG tablet ?Commonly known as: LIPITOR ?Take 1 tablet (80 mg total) by mouth daily. ?Start taking on: February 14, 2022 ?  ?carbidopa-levodopa 25-100 MG tablet ?Commonly known as: SINEMET IR ?Take 1 tablet by mouth 4 (four) times daily. ?  ?CENTRUM SILVER 50+MEN PO ?Take 1 tablet by mouth daily. ?  ?Cholecalciferol 50 MCG (2000 UT) Caps ?Take 2,000 Units by mouth daily. ?  ?clonazePAM 0.5 MG tablet ?Commonly known as: KLONOPIN ?Take 0.5 mg by mouth daily. ?  ?dasatinib 20 MG tablet ?Commonly  known as: Sprycel ?Take 1 tablet (20 mg total) by mouth daily. ?  ?latanoprost 0.005 % ophthalmic solution ?Commonly known as: XALATAN ?Place 1 drop into the right eye at bedtime. ?  ?lisinopril 10 MG tablet ?Commonly known as: ZESTRIL ?Take 10 mg by mouth daily. ?  ?tamsulosin 0.4 MG Caps capsule ?Commonly known as: FLOMAX ?Take 0.4 mg by mouth daily. ?  ? ?  ? ?  ?  ? ? ?  ?Durable Medical Equipment  ?(From admission, onward)  ?  ? ? ?  ? ?  Start     Ordered  ? 02/13/22 1147  For home use only DME Walker rolling  Once       ?Question Answer Comment  ?Walker: With 5 Inch Wheels   ?Patient needs a walker to treat with the following condition Muscle weakness (generalized)   ?  ? 02/13/22 1147  ? ?  ?  ? ?  ? ? Follow-up Information   ? ? [facility] on 02/16/2022.   ?Why: New PCP appointment scheduled for 02/16/22 at 9:30 am at [facility].  ?Please arrive at 9:00 am to complete paperwork. ?Please pring photo ID, insurance card, and all current medications in their original bottles. ?Contact information: ?[address] ?[place] [address] ?[phone] ? ? ?  ?  ? ? [facility] Follow up in 1 month(s).   ?Contact information: ?[address] ?[phone] ? ?  ?  ? ?  ?  ? ?  ? ?Discharge Exam: ?Filed Weights  ? 02/12/22 1007  ?Weight: 74.2 kg  ? ?General exam: Appears calm and comfortable  ?Respiratory system: Clear to auscultation. Respiratory effort normal. ?Cardiovascular system: Irregular. No JVD, murmurs, rubs, gallops or clicks. No pedal edema. ?Gastrointestinal system: Abdomen is nondistended, soft and nontender. No organomegaly or masses felt. Normal bowel sounds heard. ?Central nervous system: Alert and oriented. No focal neurological deficits. ?Extremities: Symmetric 5 x 5 power. ?Skin: No rashes, lesions or ulcers ?Psychiatry: Judgement and insight appear normal. Mood & affect appropriate.   ? ? ?Condition at discharge: good ? ?The results of significant diagnostics from this hospitalization (including imaging, microbiology, ancillary and laboratory) are listed below for reference.  ? ?Imaging Studies: ?CT ANGIO HEAD NECK W WO CM ? ?Result Date: 02/12/2022 ?CLINICAL DATA:  Stroke/TIA, determine embolic source; slurred speech and left arm numbness EXAM: CT ANGIOGRAPHY HEAD AND NECK TECHNIQUE: Multidetector CT imaging of the head and neck was performed using the standard protocol during bolus administration of intravenous contrast. Multiplanar CT image reconstructions and MIPs were obtained to evaluate the vascular anatomy. Carotid stenosis measurements (when applicable) are obtained utilizing NASCET criteria, using the distal internal carotid diameter as the denominator. RADIATION DOSE REDUCTION: This exam was performed according to the departmental dose-optimization program which includes automated exposure control, adjustment of the mA and/or  kV according to patient size and/or use of iterative reconstruction technique. CONTRAST:  72m OMNIPAQUE IOHEXOL 350 MG/ML SOLN COMPARISON:  None. FINDINGS: CTA NECK Aortic arch: Mixed plaque along the included arch and patent great vessel origins. No high-grade proximal subclavian stenosis. Right carotid system: Patent. Mixed plaque along the proximal ICA with less than 50% stenosis. Left carotid system: Patent. Mixed plaque along the common carotid with less than 50% stenosis. Primarily calcified plaque along the proximal internal carotid with less than 50% stenosis. Mixed plaque along the distal cervical ICA with less than 50% stenosis. Vertebral arteries: Right vertebral artery is patent. Noncalcified plaque at the left vertebral origin with marked stenosis. The remainder of the extracranial vertebral artery is irregular with multifocal stenoses. Skeleton: Cervical spine degenerative changes. Other neck: Unremarkable. Upper chest: Mild centrilobular emphysema.  Review of the MIP images confirms the above findings CTA HEAD Anterior circulation: Intracranial internal carotid arteries are patent with minimal calcified plaque. Anterior and middle cerebral arteries are patent.

## 2022-02-13 NOTE — Consult Note (Signed)
NEUROLOGY CONSULTATION NOTE  ? ?Date of service: February 13, 2022 ?Patient Name: Kevin Love ?MRN:  [id number] ?DOB:  10-24-1945 ?Reason for consult: LUE weakness ?Requesting physician: Dr. Francine Graven ?_ _ _   _ __   _ __ _ _  __ __   _ __   __ _ ? ?History of Present Illness  ? ?This is a 76 yo man with pmhx Parkinson's disease, CML chronic phase on sprycel admitted for LUE weakness after an episode of AMS and slurred speech last night. Wife at bedside states that patient became confused last night with gait instability, slurred speech, lethargy. She thought maybe he might have accidentally taken a double dose of one of his medications. He went to sleep and slept much harder than usual. When he woke up this AM, he had a L wrist drop and she brought him to ED for further evaluation. MRI brain showed no e/o acute infarct or other acute process (personal review). He is in a fib in ED, new dx. Stroke workup this admission: ? ?CTA H&N ? ?No large vessel occlusion. ?  ?Plaque along the common and internal carotids with less than 50% ?stenosis. ?  ?Marked stenosis at the left vertebral origin. Remainder of ?extracranial vertebral artery is irregular with multifocal stenosis. ?Favored to reflect atherosclerosis over dissection. Intracranially, ?improved caliber beyond the PICA origin may be due to retrograde ?flow. ? ?TTE pending ? ?Stroke Labs ? ?   ?Component Value Date/Time  ? CHOL 153 02/13/2022 0449  ? TRIG 139 02/13/2022 0449  ? HDL 30 (L) 02/13/2022 0449  ? CHOLHDL 5.1 02/13/2022 0449  ? VLDL 28 02/13/2022 0449  ? [place] 95 02/13/2022 0449  ? ? ?Lab Results  ?Component Value Date/Time  ? HGBA1C 5.8 (H) 02/12/2022 01:02 PM  ? ? ? ? ?  ?ROS  ? ?Per HPI: all other systems reviewed and are negative ? ?Past History  ? ?I have reviewed the following: ? ?Past Medical History:  ?Diagnosis Date  ? Parkinson disease ([place]) 07/25/2020  ? ?History reviewed. No pertinent surgical history. ?History reviewed. No pertinent family  history. ?Social History  ? ?Socioeconomic History  ? Marital status: Married  ?  Spouse name: Not on file  ? Number of children: Not on file  ? Years of education: Not on file  ? Highest education level: Not on file  ?Occupational History  ? Not on file  ?Tobacco Use  ? Smoking status: Never  ? Smokeless tobacco: Never  ?Substance and Sexual Activity  ? Alcohol use: Never  ? Drug use: Never  ? Sexual activity: Not Currently  ?Other Topics Concern  ? Not on file  ?Social History Narrative  ? Not on file  ? ?Social Determinants of Health  ? ?Financial Resource Strain: Not on file  ?Food Insecurity: Not on file  ?Transportation Needs: Not on file  ?Physical Activity: Not on file  ?Stress: Not on file  ?Social Connections: Not on file  ? ?Allergies  ?Allergen Reactions  ? Penicillins Rash  ? ? ?Medications  ? ?Medications Prior to Admission  ?Medication Sig Dispense Refill Last Dose  ? carbidopa-levodopa (SINEMET IR) 25-100 MG tablet Take 1 tablet by mouth 4 (four) times daily.   02/12/2022 at 0800  ? Cholecalciferol 50 MCG (2000 UT) CAPS Take 2,000 Units by mouth daily.   02/12/2022 at 0800  ? clonazePAM (KLONOPIN) 0.5 MG tablet Take 0.5 mg by mouth daily.   02/11/2022  ? dasatinib (SPRYCEL) 20 MG tablet  Take 1 tablet (20 mg total) by mouth daily. 30 tablet 11 02/11/2022 at 2300  ? gemfibrozil (LOPID) 600 MG tablet Take 600 mg by mouth daily.   02/12/2022 at 0800  ? latanoprost (XALATAN) 0.005 % ophthalmic solution Place 1 drop into the right eye at bedtime.   02/11/2022 at 2300  ? lisinopril (ZESTRIL) 10 MG tablet Take 10 mg by mouth daily.   02/12/2022 at 0800  ? Multiple Vitamins-Minerals (CENTRUM SILVER 50+MEN PO) Take 1 tablet by mouth daily.   02/12/2022 at 0700  ? tamsulosin (FLOMAX) 0.4 MG CAPS capsule Take 0.4 mg by mouth daily.   02/11/2022 at 1300  ? ascorbic acid (VITAMIN C) 500 MG tablet daily (Patient not taking: Reported on 02/12/2022)   Not Taking  ? B Complex Vitamins (RA B-COMPLEX) TABS  (Patient not taking:  Reported on 02/12/2022)   Not Taking  ? ibuprofen (ADVIL) 200 MG tablet Take by mouth. (Patient not taking: Reported on 02/12/2022)   Not Taking  ? Icosapent Ethyl (VASCEPA) 0.5 g CAPS Take 1 capsule by mouth 2 (two) times daily. (Patient not taking: Reported on 02/12/2022)   Not Taking  ? memantine (NAMENDA) 10 MG tablet Take 10 mg by mouth 2 (two) times daily. (Patient not taking: Reported on 02/12/2022)   Not Taking  ? potassium chloride SA (KLOR-CON) 20 MEQ tablet 10 mEq. (Patient not taking: Reported on 11/06/2021)     ? PRED FORTE 1 % ophthalmic suspension Place 1 drop into the right eye 3 (three) times daily. (Patient not taking: Reported on 02/12/2022)   Not Taking  ? prochlorperazine (COMPAZINE) 10 MG tablet Take 1 tablet (10 mg total) by mouth every 6 (six) hours as needed for nausea or vomiting. (Patient not taking: Reported on 02/12/2022) 30 tablet 0 Not Taking  ? vitamin E 28000 units external oil Apply topically daily. (Patient not taking: Reported on 02/12/2022)   Not Taking  ?  ? ? ?Current Facility-Administered Medications:  ?  acetaminophen (TYLENOL) tablet 650 mg, 650 mg, Oral, Q4H PRN **OR** acetaminophen (TYLENOL) 160 MG/5ML solution 650 mg, 650 mg, Per Tube, Q4H PRN **OR** acetaminophen (TYLENOL) suppository 650 mg, 650 mg, Rectal, Q4H PRN, Agbata, Tochukwu, MD ?  amiodarone (PACERONE) tablet 400 mg, 400 mg, Oral, Daily, Scoggins, Amber, NP, 400 mg at 02/13/22 7616 ?  apixaban (ELIQUIS) tablet 5 mg, 5 mg, Oral, BID, Agbata, Tochukwu, MD, 5 mg at 02/13/22 0737 ?  atorvastatin (LIPITOR) tablet 80 mg, 80 mg, Oral, Daily, Sharen Hones, MD, 80 mg at 02/13/22 0810 ?  carbidopa-levodopa (SINEMET IR) 25-100 MG per tablet immediate release 1 tablet, 1 tablet, Oral, QID, Derek Jack, MD, 1 tablet at 02/13/22 1062 ?  clonazePAM (KLONOPIN) tablet 0.5 mg, 0.5 mg, Oral, Daily, Agbata, Tochukwu, MD, 0.5 mg at 02/13/22 6948 ?  dasatinib (SPRYCEL) tablet 20 mg, 20 mg, Oral, Daily, Agbata, Tochukwu, MD ?   gemfibrozil (LOPID) tablet 600 mg, 600 mg, Oral, BID AC, Agbata, Tochukwu, MD, 600 mg at 02/13/22 5462 ?  latanoprost (XALATAN) 0.005 % ophthalmic solution 1 drop, 1 drop, Both Eyes, QHS, Agbata, Tochukwu, MD, 1 drop at 02/12/22 2056 ?  memantine (NAMENDA) tablet 10 mg, 10 mg, Oral, BID, Agbata, Tochukwu, MD, 10 mg at 02/13/22 7035 ?  multivitamin with minerals tablet 1 tablet, 1 tablet, Oral, Daily, Agbata, Tochukwu, MD, 1 tablet at 02/13/22 0093 ?  tamsulosin (FLOMAX) capsule 0.4 mg, 0.4 mg, Oral, QPC supper, Agbata, Tochukwu, MD, 0.4 mg at 02/12/22 1709 ? ?Vitals  ? ?  Vitals:  ? 02/12/22 2040 02/12/22 2355 02/13/22 0443 02/13/22 0742  ?BP: (!) 124/55 (!) 123/55 (!) 141/64 139/61  ?Pulse: 65 (!) 59 (!) 59 (!) 58  ?Resp: '16 16 16 20  '$ ?Temp: 97.9 ?F (36.6 ?C) 98.5 ?F (36.9 ?C) 98 ?F (36.7 ?C) 98.1 ?F (36.7 ?C)  ?TempSrc: Oral Oral Oral Oral  ?SpO2: 95% 95% 95% 96%  ?Weight:      ?Height:      ?  ? ?Body mass index is 27.22 kg/m?. ? ?Physical Exam  ? ?Physical Exam ?Gen: A&O x4, NAD ?HEENT: Atraumatic, normocephalic;mucous membranes moist; oropharynx clear, tongue without atrophy or fasciculations. ?Neck: Supple, trachea midline. ?Resp: CTAB, no w/r/r ?CV: RRR, no m/g/r; nml S1 and S2. 2+ symmetric peripheral pulses. ?Abd: soft/NT/ND; nabs x 4 quad ?Extrem: Nml bulk; no cyanosis, clubbing, or edema. ? ?Neuro: ?*MS: A&O x4. Follows multi-step commands.  ?*Speech: fluid, nondysarthric, able to name and repeat ?*CN:  ?  I: Deferred ?  II,III: PERRLA, VFF by confrontation, optic discs unable to be visualized 2/2 pupillary constriction ?  III,IV,VI: EOMI w/o nystagmus, no ptosis ?  V: Sensation intact from V1 to V3 to LT ?  VII: Eyelid closure was full.  Smile symmetric. ?  VIII: Hearing intact to voice ?  IX,X: Voice normal, palate elevates symmetrically  ?  XI: SCM/trap 5/5 bilat   ?XII: Tongue protrudes midline, no atrophy or fasciculations  ? ?*Motor:   Normal bulk.  No tremor, rigidity or bradykinesia. No pronator  drift. All extremities 5/5 throughout except (+) L wrist drop. ?*Sensory: Intact to light touch, pinprick, temperature vibration throughout. Symmetric. Propioception intact bilat.  No double-simultaneous extinction.  ?*[place]oo

## 2022-02-13 NOTE — Progress Notes (Signed)
Discharge instructions reviewed with pt and wife, states understanding, pt with no complaints  ?

## 2022-02-13 NOTE — Discharge Instructions (Signed)
Please follow-up with your cardiologist in 1 to 2 weeks, perform a echocardiogram at the next office visit. ?Follow-up with your neurologist in 1 month. ?

## 2022-02-13 NOTE — Progress Notes (Signed)
SLP Cancellation Note ? ?Patient Details ?Name: Kevin Love ?MRN: [id number] ?DOB: 1946-08-29 ? ? ?Cancelled treatment:       Reason Eval/Treat Not Completed: SLP screened, no needs identified, will sign off (chart reviewed; consulted NSG then met w/ pt and Wife in room) ?Pt denied any difficulty swallowing and is currently on a regular diet; tolerates swallowing pills w/ water per NSG. Pt conversed in conversation w/out overt expressive/receptive deficits noted; pt denied any speech-language deficits. Speech clear, intelligible. ?No further skilled ST services indicated as pt appears at his baseline. Pt agreed. NSG to reconsult if any change in status while admitted.   ? ? ? ? ? ?Orinda Kenner, MS, CCC-SLP ?Speech Language Pathologist ?[facility]; [place] ?[phone] (ascom) ?Azalya Galyon ?02/13/2022, 9:58 AM ?

## 2022-02-13 NOTE — Hospital Course (Signed)
Kevin Love is a 76 y.o. male with medical history significant for Parkinson's disease, CML, BPH who presented to the ER via private vehicle for evaluation of slurred speech and left weakness/numbness.  MRI of the brain did not show acute changes.  EKG also showed atrial fibrillation.  Patient is seen by cardiology and neurology.  He will be treated with Eliquis and Lipitor. ?Symptom had improved, patient is medically stable to be discharged ?

## 2022-02-13 NOTE — TOC Initial Note (Signed)
Transition of Care (TOC) - Initial/Assessment Note  ? ? ?Patient Details  ?Name: Kevin Love ?MRN: [id number] ?Date of Birth: 1946/03/11 ? ?Transition of Care (TOC) CM/SW Contact:    ?Tessah Patchen E Bristal Steffy, LCSW ?Phone Number: ?02/13/2022, 12:25 PM ? ?Clinical Narrative:                Met with patient and spouse at bedside. [place]. Patient needs a local PCP. Provided list of some PCP options. They are going to research the options. They are agreeable to CSW making appointment at [facility] for the time being since they have appointments available for next week. Appointment made for Monday 5/1 at 9:30 am, added information to AVS.  ?Patient and spouse also agreeable to [facility]. Confirmed home address. Referral accepted by Corene Cornea with [facility]. ?Patient has a cane at home, needs a RW. RW ordered through [place] with [employer] to be delivered to bedside prior to DC today.  ? ? ?Expected Discharge Plan: [place] ?Barriers to Discharge: Continued Medical Work up ? ? ?Patient Goals and CMS Choice ?Patient states their goals for this hospitalization and ongoing recovery are:: home with [facility] ?CMS Medicare.gov Compare Post Acute Care list provided to:: Patient ?Choice offered to / list presented to : Patient, Spouse ? ?Expected Discharge Plan and Services ?Expected Discharge Plan: [place] ?  ?  ?  ?Living arrangements for the past 2 months: [place] ?                ?DME Arranged: Walker rolling ?DME Agency: [employer] ?Date DME Agency Contacted: 02/13/22 ?  ?Representative spoke with at DME Agency: Suanne Marker ?HH Arranged: PT, OT ?[place] Agency: [place] ([employer] ?Date HH Agency Contacted: 02/13/22 ?  ?Representative spoke with at [place]: Corene Cornea ? ?Prior Living Arrangements/Services ?Living arrangements for the past 2 months: [place] ?Lives with:: Spouse ?Patient language and need for interpreter reviewed:: Yes ?Do you feel  safe going back to the place where you live?: Yes      ?Need for Family Participation in Patient Care: Yes (Comment) ?Care giver support system in place?: Yes (comment) ?Current home services: DME ?Criminal Activity/Legal Involvement Pertinent to Current Situation/Hospitalization: No - Comment as needed ? ?Activities of Daily Living ?Home Assistive Devices/Equipment: Kasandra Knudsen (specify quad or straight), Eyeglasses ?ADL Screening (condition at time of admission) ?Patient's cognitive ability adequate to safely complete daily activities?: Yes ?Is the patient deaf or have difficulty hearing?: No ?Does the patient have difficulty seeing, even when wearing glasses/contacts?: No ?Does the patient have difficulty concentrating, remembering, or making decisions?: No ?Patient able to express need for assistance with ADLs?: Yes ?Does the patient have difficulty dressing or bathing?: No ?Independently performs ADLs?: Yes (appropriate for developmental age) ?Does the patient have difficulty walking or climbing stairs?: No ?Weakness of Legs: None ?Weakness of Arms/Hands: None ? ?Permission Sought/Granted ?Permission sought to share information with : Customer service manager ?Permission granted to share information with : Yes, Verbal Permission Granted ?   ? Permission granted to share info w AGENCY: [place], DME, PCPs ?   ?   ? ?Emotional Assessment ?  ?  ?  ?Orientation: : Oriented to Self, Oriented to Place, Oriented to  Time, Oriented to Situation ?Alcohol / Substance Use: Not Applicable ?Psych Involvement: No (comment) ? ?Admission diagnosis:  CVA (cerebral vascular accident) ([place]) [I63.9] ?Patient Active Problem List  ? Diagnosis Date Noted  ?  CVA (cerebral vascular accident) ([place]) 02/12/2022  ? Atrial fibrillation ([place]) 02/12/2022  ? CML (chronic myelocytic leukemia) ([place]) 08/05/2020  ? Leukocytosis 07/25/2020  ? Hypertension 07/25/2020  ? Parkinson disease ([place]) 07/25/2020  ? TIA (transient ischemic attack) 07/25/2020   ? ?PCP:  Jacqualine Code, DO ?Pharmacy:   ?[id number] [place] [address]?Phone: [phone]: [phone]? ?[facility]?[address]?Phone: [phone]: [phone]? ?[facility] [address] ?[place]?Phone: [phone]: [phone]? ? ? ? ?Social Determinants of Health (SDOH) Interventions ?  ? ?Readmission Risk Interventions ?   ? View : No data to display.  ?  ?  ?  ? ? ? ?

## 2022-02-13 NOTE — Progress Notes (Signed)
SUBJECTIVE: Kevin Love is a 76 y.o. male with medical history significant for Parkinson's disease, CML, BPH who presented to the ER via private vehicle for evaluation of slurred speech and left weakness/numbness. ?Patient's wife who provides most of the history states that the night prior to his hospitalization she noted he had difficulty ambulating which seem to be out of place for him.  She was able to get him in bed and checked on him several times during the night and he appeared to be okay.  He woke up this morning with weakness and inability to use his left arm and his wife states that his speech was slurred when he first woke up. ?He denies having any headache, no blurred vision, no difficulty swallowing, no double vision, no chest pain, no shortness of breath, no dizziness, no lightheadedness, no focal deficit fever or chills. ?He was able to ambulate to the car and his wife brought him to the hospital. ? ?Patient currently sitting in bed, eating breakfast. Denies chest pain, shortness of breath, dizziness, palpitations.  ? ? ?Vitals:  ? 02/12/22 2040 02/12/22 2355 02/13/22 0443 02/13/22 0742  ?BP: (!) 124/55 (!) 123/55 (!) 141/64 139/61  ?Pulse: 65 (!) 59 (!) 59 (!) 58  ?Resp: '16 16 16 20  '$ ?Temp: 97.9 ?F (36.6 ?C) 98.5 ?F (36.9 ?C) 98 ?F (36.7 ?C) 98.1 ?F (36.7 ?C)  ?TempSrc: Oral Oral Oral Oral  ?SpO2: 95% 95% 95% 96%  ?Weight:      ?Height:      ? ? ?Intake/Output Summary (Last 24 hours) at 02/13/2022 0904 ?Last data filed at 02/12/2022 1800 ?Gross per 24 hour  ?Intake 240 ml  ?Output --  ?Net 240 ml  ? ? ?LABS: ?Basic Metabolic Panel: ?Recent Labs  ?  02/12/22 ?1008  ?NA 137  ?K 4.2  ?CL 106  ?CO2 26  ?GLUCOSE 167*  ?BUN 16  ?CREATININE 0.77  ?CALCIUM 9.2  ? ?Liver Function Tests: ?Recent Labs  ?  02/12/22 ?1008  ?AST 20  ?ALT 11  ?ALKPHOS 68  ?BILITOT 0.8  ?PROT 6.9  ?ALBUMIN 3.8  ? ?No results for input(s): LIPASE, AMYLASE in the last 72 hours. ?CBC: ?Recent Labs  ?  02/12/22 ?1008  ?WBC 4.4   ?NEUTROABS 2.5  ?HGB 13.3  ?HCT 40.2  ?MCV 95.0  ?PLT 226  ? ?Cardiac Enzymes: ?No results for input(s): CKTOTAL, CKMB, CKMBINDEX, TROPONINI in the last 72 hours. ?BNP: ?Invalid input(s): POCBNP ?D-Dimer: ?No results for input(s): DDIMER in the last 72 hours. ?Hemoglobin A1C: ?Recent Labs  ?  02/12/22 ?1302  ?HGBA1C 5.8*  ? ?Fasting Lipid Panel: ?Recent Labs  ?  02/13/22 ?0449  ?CHOL 153  ?HDL 30*  ?[place] 95  ?TRIG 139  ?CHOLHDL 5.1  ? ?Thyroid Function Tests: ?No results for input(s): TSH, T4TOTAL, T3FREE, THYROIDAB in the last 72 hours. ? ?Invalid input(s): FREET3 ?Anemia Panel: ?No results for input(s): VITAMINB12, FOLATE, FERRITIN, TIBC, IRON, RETICCTPCT in the last 72 hours. ? ? ?PHYSICAL EXAM ?General: Well developed, well nourished, in no acute distress ?HEENT:  Normocephalic and atramatic ?Neck:  No JVD.  ?Lungs: Clear bilaterally to auscultation and percussion. ?Heart: HRRR . Normal S1 and S2 without gallops or murmurs.  ?Abdomen: Bowel sounds are positive, abdomen soft and non-tender  ?Msk:  Back normal, normal gait. Normal strength and tone for age. ?Extremities: No clubbing, cyanosis or edema.   ?Neuro: Alert and oriented X 3. ?Psych:  Good affect, responds appropriately ? ?TELEMETRY: not available ? ?ASSESSMENT  AND PLAN: Patient reports left arm weakness and numbness slowly improving. CT of head negative for CVA, MRI of head negative for stroke. Start on Eliquis per neurology. Echocardiogram results pending. Continue amidoarone 400 mg daily and Eliquis 5 mg daily. Will continue to follow.  ? ?Principal Problem: ?  CVA (cerebral vascular accident) ([place]) ?Active Problems: ?  Hypertension ?  Parkinson disease ([place]) ?  CML (chronic myelocytic leukemia) ([place]) ?  Atrial fibrillation ([place]) ?  ? ?[employer], FNP-C ?02/13/2022 ?9:04 AM ? ? ? ?    ?

## 2022-02-13 NOTE — Evaluation (Signed)
Occupational Therapy Evaluation ?Patient Details ?Name: Kevin Love ?MRN: [id number] ?DOB: January 27, 1946 ?Today's Date: 02/13/2022 ? ? ?History of Present Illness Pt is a 76 year old male admitted with slurred speech and left weakness/numbness; PMH significant for Parkinson's disease, CML, BPH who presented to the ER via private vehicle for evaluation of slurred speech and left weakness/numbness.  ? ?Clinical Impression ?  ?Chart reviewed, RN cleared pt for participatoin in OT evaluation. Pt greeted in bed, alert and oriented x4, agreeable to session. PTA pt is MOD I-I in all ADL/mobility. Assist from wife for driving (due to baseline vision deficits and Parkinsons). Pt and wife report they just moved to the area 1 week ago. Pt performs mobility/ADL with RW at supervision level. Deficits noted in L hand/wrist, please see below. 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 Pt and wife educated on HEP and positioning of LUE. Pt is left in bedside chair, NAD, all needs met. OT will follow acutely. Recommend HHOT following discharge.  ?   ? ?Recommendations for follow up therapy are one component of a multi-disciplinary discharge planning process, led by the attending physician.  Recommendations may be updated based on patient status, additional functional criteria and insurance authorization.  ? ?Follow Up Recommendations ? Home health OT  ?  ?Assistance Recommended at Discharge Intermittent Supervision/Assistance  ?Patient can return home with the following Assistance with cooking/housework;Assist for transportation ? ?  ?Functional Status Assessment ? Patient has had a recent decline in their functional status and demonstrates the ability to make significant improvements in function in a reasonable and predictable amount of time.  ?Equipment Recommendations ? None recommended by OT  ?  ?Recommendations for Other Services   ? ? ?  ?Precautions / Restrictions Precautions ?Precautions: Fall ?Precaution  Comments: L radial nerve palsy, L prosthetic eye ?Restrictions ?Weight Bearing Restrictions: No  ? ?  ? ?Mobility Bed Mobility ?Overal bed mobility: Needs Assistance ?Bed Mobility: Supine to Sit ?  ?  ?Supine to sit: Modified independent (Device/Increase time), HOB elevated ?  ?  ?  ?  ? ?Transfers ?Overall transfer level: Needs assistance ?Equipment used: Rolling walker (2 wheels) ?Transfers: Sit to/from Stand ?Sit to Stand: Supervision ?  ?  ?  ?  ?  ?  ?  ? ?  ?Balance Overall balance assessment: Needs assistance ?Sitting-balance support: Feet supported ?Sitting balance-Leahy Scale: Good ?  ?  ?Standing balance support: Reliant on assistive device for balance, During functional activity ?Standing balance-Leahy Scale: Good ?  ?  ?  ?  ?  ?  ?  ?  ?  ?  ?  ?  ?   ? ?ADL either performed or assessed with clinical judgement  ? ?ADL Overall ADL's : Needs assistance/impaired ?Eating/Feeding: Set up;Sitting ?  ?Grooming: Wash/dry hands;Wash/dry face;Standing;Supervision/safety ?Grooming Details (indicate cue type and reason): sink level with RW ?  ?  ?  ?  ?  ?  ?Lower Body Dressing: Supervision/safety;Sit to/from stand ?Lower Body Dressing Details (indicate cue type and reason): boxers with RW ?Toilet Transfer: Supervision/safety;Regular Toilet;Rolling walker (2 wheels) ?  ?Toileting- Clothing Manipulation and Hygiene: Sit to/from stand;Supervision/safety ?  ?  ?  ?Functional mobility during ADLs: Supervision/safety;Rolling walker (2 wheels) ?   ? ? ? ?Vision   ?Additional Comments: pt with L prosthetic eye; vision appears at baseline  ?   ?Perception   ?  ?Praxis   ?  ? ?Pertinent Vitals/Pain Pain Assessment ?Pain Assessment: No/denies pain  ? ? ? ?  Hand Dominance Right ?  ?Extremity/Trunk Assessment Upper Extremity Assessment ?Upper Extremity Assessment: LUE deficits/detail;RUE deficits/detail ?RUE Deficits / Details: All WFL ?LUE Deficits / Details: L shoulder 4+/5, elbow flexion/extension 4+/5; Wrist drop with trace  wrist flexion/extension against gravity; weak grip strength ?LUE Sensation: WNL ?LUE Coordination: decreased fine motor;decreased gross motor ?  ?  ?  ?  ?  ?Communication Communication ?Communication: No difficulties ?  ?Cognition Arousal/Alertness: Awake/alert ?Behavior During Therapy: [facility] for tasks assessed/performed ?Overall Cognitive Status: Within Functional Limits for tasks assessed ?  ?  ?  ?  ?  ?  ?  ?  ?  ?  ?  ?  ?  ?  ?  ?  ?  ?  ?  ?General Comments    ? ?  ?Exercises Other Exercises ?Other Exercises: edu re: role of OT, role of rehab, discharge recommendations, L hand positioning/HEP, falls prevention, home safety ?  ?Shoulder Instructions    ? ? ?Home Living Family/patient expects to be discharged to:: Private residence ?Living Arrangements: Spouse/significant other ?Available Help at Discharge: Family;Available 24 hours/day ?Type of Home: House ?Home Access: Stairs to enter ?Entrance Stairs-Number of Steps: 3 ?Entrance Stairs-Rails: Right ?Home Layout: One level ?  ?  ?Bathroom Shower/Tub: Tub/shower unit ?  ?Bathroom Toilet: Standard ?  ?  ?Home Equipment: Kasandra Knudsen - single point ?  ?  ?  ? ?  ?Prior Functioning/Environment   ?  ?  ?  ?  ?  ?  ?Mobility Comments: MOD I with SPC PRN home and community distances ?ADLs Comments: mod I-I in ADL PTA, wife drives, pt able to complete cooking/ cleaning/laundry ?  ? ?  ?  ?OT Problem List: Decreased strength;Decreased range of motion;Impaired UE functional use ?  ?   ?OT Treatment/Interventions: Self-care/ADL training;Manual therapy;Therapeutic exercise;Modalities;Patient/family education;Neuromuscular education;Splinting;Therapeutic activities;DME and/or AE instruction  ?  ?OT Goals(Current goals can be found in the care plan section) Acute Rehab OT Goals ?Patient Stated Goal: go home ?OT Goal Formulation: With patient/family ?Time For Goal Achievement: 02/27/22 ?Potential to Achieve Goals: Good ?ADL Goals ?Pt Will Perform Grooming: with modified  independence ?Pt Will Perform Upper Body Dressing: with modified independence ?Pt Will Perform Lower Body Dressing: with modified independence ?Pt Will Transfer to Toilet: with modified independence;ambulating ?Pt Will Perform Toileting - Clothing Manipulation and hygiene: with modified independence  ?OT Frequency: Min 2X/week ?  ? ?Co-evaluation   ?  ?  ?  ?  ? ?  ?AM-PAC OT "6 Clicks" Daily Activity     ?Outcome Measure Help from another person eating meals?: None ?Help from another person taking care of personal grooming?: None ?Help from another person toileting, which includes using toliet, bedpan, or urinal?: None ?Help from another person bathing (including washing, rinsing, drying)?: A Little ?Help from another person to put on and taking off regular upper body clothing?: None ?Help from another person to put on and taking off regular lower body clothing?: A Little ?6 Click Score: 22 ?  ?End of Session Equipment Utilized During Treatment: Gait belt;Rolling walker (2 wheels) ?Nurse Communication: Mobility status ? ?Activity Tolerance: Patient tolerated treatment well ?Patient left: in chair;with call bell/phone within reach;with chair alarm set ? ?OT Visit Diagnosis: Hemiplegia and hemiparesis;Unsteadiness on feet (R26.81)  ?              ?Time: 2831-5176 ?OT Time Calculation (min): 28 min ?Charges:  OT General Charges ?$OT Visit: 1 Visit ?OT Evaluation ?$OT Eval Low Complexity: 1 Low ?  OT Treatments ?$Self Care/Home Management : 8-22 mins ? ?Shanon Payor, OTD OTR/L  ?02/13/22, 1:03 PM  ?

## 2022-02-17 ENCOUNTER — Inpatient Hospital Stay (HOSPITAL_COMMUNITY): Payer: Medicare Other | Attending: Hematology

## 2022-02-17 ENCOUNTER — Ambulatory Visit (HOSPITAL_COMMUNITY): Payer: Medicare Other | Admitting: Hematology

## 2022-02-17 ENCOUNTER — Other Ambulatory Visit (HOSPITAL_COMMUNITY): Payer: Self-pay

## 2022-02-17 ENCOUNTER — Inpatient Hospital Stay (HOSPITAL_COMMUNITY): Payer: Medicare Other

## 2022-02-17 ENCOUNTER — Inpatient Hospital Stay (HOSPITAL_BASED_OUTPATIENT_CLINIC_OR_DEPARTMENT_OTHER): Payer: Medicare Other | Admitting: Hematology

## 2022-02-17 VITALS — BP 106/51 | HR 66 | Temp 98.1°F | Resp 18 | Wt 157.3 lb

## 2022-02-17 DIAGNOSIS — G20 Parkinson's disease: Secondary | ICD-10-CM | POA: Diagnosis not present

## 2022-02-17 DIAGNOSIS — Z79899 Other long term (current) drug therapy: Secondary | ICD-10-CM | POA: Insufficient documentation

## 2022-02-17 DIAGNOSIS — R39198 Other difficulties with micturition: Secondary | ICD-10-CM | POA: Insufficient documentation

## 2022-02-17 DIAGNOSIS — I4891 Unspecified atrial fibrillation: Secondary | ICD-10-CM | POA: Insufficient documentation

## 2022-02-17 DIAGNOSIS — C9210 Chronic myeloid leukemia, BCR/ABL-positive, not having achieved remission: Secondary | ICD-10-CM

## 2022-02-17 DIAGNOSIS — Z8673 Personal history of transient ischemic attack (TIA), and cerebral infarction without residual deficits: Secondary | ICD-10-CM | POA: Insufficient documentation

## 2022-02-17 DIAGNOSIS — Z87891 Personal history of nicotine dependence: Secondary | ICD-10-CM | POA: Diagnosis not present

## 2022-02-17 DIAGNOSIS — R413 Other amnesia: Secondary | ICD-10-CM | POA: Diagnosis not present

## 2022-02-17 LAB — COMPREHENSIVE METABOLIC PANEL
ALT: 10 U/L (ref 0–44)
AST: 18 U/L (ref 15–41)
Albumin: 3.9 g/dL (ref 3.5–5.0)
Alkaline Phosphatase: 71 U/L (ref 38–126)
Anion gap: 5 (ref 5–15)
BUN: 21 mg/dL (ref 8–23)
CO2: 25 mmol/L (ref 22–32)
Calcium: 9.3 mg/dL (ref 8.9–10.3)
Chloride: 108 mmol/L (ref 98–111)
Creatinine, Ser: 0.94 mg/dL (ref 0.61–1.24)
GFR, Estimated: >60 mL/min (ref >60)
Glucose, Bld: 149 mg/dL — ABNORMAL HIGH (ref 70–99)
Potassium: 4 mmol/L (ref 3.5–5.1)
Sodium: 138 mmol/L (ref 135–145)
Total Bilirubin: 0.7 mg/dL (ref 0.3–1.2)
Total Protein: 6.7 g/dL (ref 6.5–8.1)

## 2022-02-17 LAB — CBC WITH DIFFERENTIAL/PLATELET
Abs Immature Granulocytes: 0.04 10*3/uL (ref 0.00–0.07)
Basophils Absolute: 0.1 10*3/uL (ref 0.0–0.1)
Basophils Relative: 1 %
Eosinophils Absolute: 0.1 10*3/uL (ref 0.0–0.5)
Eosinophils Relative: 1 %
HCT: 38.2 % — ABNORMAL LOW (ref 39.0–52.0)
Hemoglobin: 13 g/dL (ref 13.0–17.0)
Immature Granulocytes: 1 %
Lymphocytes Relative: 21 %
Lymphs Abs: 1.8 10*3/uL (ref 0.7–4.0)
MCH: 32.3 pg (ref 26.0–34.0)
MCHC: 34 g/dL (ref 30.0–36.0)
MCV: 94.8 fL (ref 80.0–100.0)
Monocytes Absolute: 0.6 10*3/uL (ref 0.1–1.0)
Monocytes Relative: 7 %
Neutro Abs: 5.7 10*3/uL (ref 1.7–7.7)
Neutrophils Relative %: 69 %
Platelets: 226 10*3/uL (ref 150–400)
RBC: 4.03 MIL/uL — ABNORMAL LOW (ref 4.22–5.81)
RDW: 13.5 % (ref 11.5–15.5)
WBC: 8.3 10*3/uL (ref 4.0–10.5)
nRBC: 0 % (ref 0.0–0.2)

## 2022-02-17 LAB — LACTATE DEHYDROGENASE: LDH: 125 U/L (ref 98–192)

## 2022-02-17 MED ORDER — DASATINIB 20 MG PO TABS: 20.0000 mg | ORAL_TABLET | Freq: Every day | ORAL | 11 refills | Status: DC

## 2022-02-17 NOTE — Patient Instructions (Addendum)
[place] at [facility] ?Discharge Instructions ? ?You were seen and examined today by Dr. Delton Coombes. ? ?Dr. Delton Coombes discussed your most recent lab work and everything looks okay. ? ?Follow-up as scheduled in 3 months. ? ? ? ?Thank you for choosing [place] at [facility] to provide your oncology and hematology care.  To afford each patient quality time with our provider, please arrive at least 15 minutes before your scheduled appointment time.  ? ?If you have a lab appointment with the [facility] please come in thru the Main Entrance and check in at the main information desk. ? ?You need to re-schedule your appointment should you arrive 10 or more minutes late.  We strive to give you quality time with our providers, and arriving late affects you and other patients whose appointments are after yours.  Also, if you no show three or more times for appointments you may be dismissed from the clinic at the providers discretion.     ?Again, thank you for choosing [facility].  Our hope is that these requests will decrease the amount of time that you wait before being seen by our physicians.       ?_____________________________________________________________ ? ?Should you have questions after your visit to [facility], please contact our office at [phone] and follow the prompts.  Our office hours are 8:00 a.m. and 4:30 p.m. Monday - Friday.  Please note that voicemails left after 4:00 p.m. may not be returned until the following business day.  We are closed weekends and major holidays.  You do have access to a nurse 24-7, just call the main number to the clinic [phone] and do not press any options, hold on the line and a nurse will answer the phone.   ? ?For prescription refill requests, have your pharmacy contact our office and allow 72 hours.   ? ?Due to Covid, you will need to wear a mask upon entering the hospital. If you do  not have a mask, a mask will be given to you at the Main Entrance upon arrival. For doctor visits, patients may have 1 support person age 18 or older with them. For treatment visits, patients can not have anyone with them due to social distancing guidelines and our immunocompromised population.  ? ?  ?

## 2022-02-17 NOTE — Progress Notes (Signed)
? ?[facility] ?[address] ?[place], [place] [address] ? ? ?CLINIC:  ?Medical Oncology/Hematology ? ?PCP:  ?Kevin Code, DO ?[place] / [place] [address]  ?[phone] ? ?REASON FOR VISIT:  ?Follow-up for CML ? ?PRIOR THERAPY: none ? ?CURRENT THERAPY: Sprycel 20 mg daily ? ?INTERVAL HISTORY:  ?[name], a 76 y.o. 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 Kevin Love was last seen on 11/06/2021. ? ?Today he reports feeling good, and he is accompanied by his wife. He and his wife have recently moved to [place]. He had a CVA on 4/27 form which he reports he continues to have weakness in his left arm; the weakness has improved slightly. He denies acid reflux. He takes Pepcid occasionally in the morning, and he is taking Sprycel at night. He reports he is able to pick up objects or tie his shoes with his left hand.  ? ?REVIEW OF SYSTEMS:  ?Review of Systems  ?Constitutional:  Negative for appetite change and fatigue.  ?Neurological:  Positive for extremity weakness (L arm) and headaches.  ?All other systems reviewed and are negative. ? ?PAST MEDICAL/SURGICAL HISTORY:  ?Past Medical History:  ?Diagnosis Date  ? Parkinson disease ([place]) 07/25/2020  ? ?No past surgical history on file. ? ?SOCIAL HISTORY:  ?Social History  ? ?Socioeconomic History  ? Marital status: Married  ?  Spouse name: Not on file  ? Number of children: Not on file  ? Years of education: Not on file  ? Highest education level: Not on file  ?Occupational History  ? Not on file  ?Tobacco Use  ? Smoking status: Never  ? Smokeless tobacco: Never  ?Substance and Sexual Activity  ? Alcohol use: Never  ? Drug use: Never  ? Sexual activity: Not Currently  ?Other Topics Concern  ? Not on file  ?Social History Narrative  ? Not on file  ? ?Social Determinants of Health  ? ?Financial Resource Strain: Not on file  ?Food Insecurity: Not on file  ?Transportation Needs: Not on file  ?Physical Activity: Not on file   ?Stress: Not on file  ?Social Connections: Not on file  ?Intimate Partner Violence: Not on file  ? ? ?FAMILY HISTORY:  ?No family history on file. ? ?CURRENT MEDICATIONS:  ?Current Outpatient Medications  ?Medication Sig Dispense Refill  ? amiodarone (PACERONE) 400 MG tablet Take 1 tablet (400 mg total) by mouth daily. 60 tablet 0  ? apixaban (ELIQUIS) 5 MG TABS tablet Take 1 tablet (5 mg total) by mouth 2 (two) times daily. 60 tablet 0  ? atorvastatin (LIPITOR) 80 MG tablet Take 1 tablet (80 mg total) by mouth daily. 30 tablet 0  ? carbidopa-levodopa (SINEMET IR) 25-100 MG tablet Take 1 tablet by mouth 4 (four) times daily.    ? Cholecalciferol 50 MCG (2000 UT) CAPS Take 2,000 Units by mouth daily.    ? clonazePAM (KLONOPIN) 0.5 MG tablet Take 0.5 mg by mouth daily.    ? latanoprost (XALATAN) 0.005 % ophthalmic solution Place 1 drop into the right eye at bedtime.    ? lisinopril (ZESTRIL) 10 MG tablet Take 10 mg by mouth daily.    ? Multiple Vitamins-Minerals (CENTRUM SILVER 50+MEN PO) Take 1 tablet by mouth daily.    ? tamsulosin (FLOMAX) 0.4 MG CAPS capsule Take 0.4 mg by mouth daily.    ? dasatinib (SPRYCEL) 20 MG tablet Take 1 tablet (20 mg total) by mouth daily. 30 tablet 11  ? ?No  current facility-administered medications for this visit.  ? ? ?ALLERGIES:  ?Allergies  ?Allergen Reactions  ? Penicillins Rash  ? ? ?PHYSICAL EXAM:  ?Performance status (ECOG): 1 - Symptomatic but completely ambulatory ? ?Vitals:  ? 02/17/22 1513  ?BP: (!) 106/51  ?Pulse: 66  ?Resp: 18  ?Temp: 98.1 ?F (36.7 ?C)  ?SpO2: 99%  ? ?Wt Readings from Last 3 Encounters:  ?02/17/22 157 lb 4.8 oz (71.4 kg)  ?02/12/22 163 lb 9.3 oz (74.2 kg)  ?11/06/21 163 lb 9.3 oz (74.2 kg)  ? ?Physical Exam ?Vitals reviewed.  ?Constitutional:   ?   Appearance: Normal appearance.  ?Cardiovascular:  ?   Rate and Rhythm: Normal rate and regular rhythm.  ?   Pulses: Normal pulses.  ?   Heart sounds: Normal heart sounds.  ?Pulmonary:  ?   Effort: Pulmonary  effort is normal.  ?   Breath sounds: Normal breath sounds.  ?Abdominal:  ?   Palpations: Abdomen is soft. There is no hepatomegaly, splenomegaly or mass.  ?   Tenderness: There is no abdominal tenderness.  ?Neurological:  ?   General: No focal deficit present.  ?   Mental Status: He is alert and oriented to person, place, and time.  ?   Comments: L wrist extension decreased  ?Psychiatric:     ?   Mood and Affect: Mood normal.     ?   Behavior: Behavior normal.  ? ? ?LABORATORY DATA:  ?I have reviewed the labs as listed.  ? ?  Latest Ref Rng & Units 02/17/2022  ?  2:18 PM 02/12/2022  ? 10:08 AM 11/06/2021  ?  2:12 PM  ?CBC  ?WBC 4.0 - 10.5 K/uL 8.3   4.4   6.4    ?Hemoglobin 13.0 - 17.0 g/dL 13.0   13.3   13.0    ?Hematocrit 39.0 - 52.0 % 38.2   40.2   37.8    ?Platelets 150 - 400 K/uL 226   226   205    ? ? ?  Latest Ref Rng & Units 02/17/2022  ?  2:18 PM 02/12/2022  ? 10:08 AM 11/06/2021  ?  2:12 PM  ?CMP  ?Glucose 70 - 99 mg/dL 149   167   171    ?BUN 8 - 23 mg/dL '21   16   24    '$ ?Creatinine 0.61 - 1.24 mg/dL 0.94   0.77   0.93    ?Sodium 135 - 145 mmol/L 138   137   138    ?Potassium 3.5 - 5.1 mmol/L 4.0   4.2   4.2    ?Chloride 98 - 111 mmol/L 108   106   106    ?CO2 22 - 32 mmol/L '25   26   23    '$ ?Calcium 8.9 - 10.3 mg/dL 9.3   9.2   9.0    ?Total Protein 6.5 - 8.1 g/dL 6.7   6.9   6.6    ?Total Bilirubin 0.3 - 1.2 mg/dL 0.7   0.8   0.5    ?Alkaline Phos 38 - 126 U/L 71   68   57    ?AST 15 - 41 U/L '18   20   17    '$ ?ALT 0 - 44 U/L '10   11   6    '$ ? ?   ?Component Value Date/Time  ? RBC 4.03 (L) 02/17/2022 1418  ? MCV 94.8 02/17/2022 1418  ? MCH 32.3 02/17/2022 1418  ?  MCHC 34.0 02/17/2022 1418  ? RDW 13.5 02/17/2022 1418  ? LYMPHSABS 1.8 02/17/2022 1418  ? MONOABS 0.6 02/17/2022 1418  ? EOSABS 0.1 02/17/2022 1418  ? BASOSABS 0.1 02/17/2022 1418  ? ? ?DIAGNOSTIC IMAGING:  ?I have independently reviewed the scans and discussed with the patient. ?CT ANGIO HEAD NECK W WO CM ? ?Result Date: 02/12/2022 ?CLINICAL DATA:   Stroke/TIA, determine embolic source; slurred speech and left arm numbness EXAM: CT ANGIOGRAPHY HEAD AND NECK TECHNIQUE: Multidetector CT imaging of the head and neck was performed using the standard protocol during bolus administration of intravenous contrast. Multiplanar CT image reconstructions and MIPs were obtained to evaluate the vascular anatomy. Carotid stenosis measurements (when applicable) are obtained utilizing NASCET criteria, using the distal internal carotid diameter as the denominator. RADIATION DOSE REDUCTION: This exam was performed according to the departmental dose-optimization program which includes automated exposure control, adjustment of the mA and/or kV according to patient size and/or use of iterative reconstruction technique. CONTRAST:  28m OMNIPAQUE IOHEXOL 350 MG/ML SOLN COMPARISON:  None. FINDINGS: CTA NECK Aortic arch: Mixed plaque along the included arch and patent great vessel origins. No high-grade proximal subclavian stenosis. Right carotid system: Patent. Mixed plaque along the proximal ICA with less than 50% stenosis. Left carotid system: Patent. Mixed plaque along the common carotid with less than 50% stenosis. Primarily calcified plaque along the proximal internal carotid with less than 50% stenosis. Mixed plaque along the distal cervical ICA with less than 50% stenosis. Vertebral arteries: Right vertebral artery is patent. Noncalcified plaque at the left vertebral origin with marked stenosis. The remainder of the extracranial vertebral artery is irregular with multifocal stenoses. Skeleton: Cervical spine degenerative changes. Other neck: Unremarkable. Upper chest: Mild centrilobular emphysema. Review of the MIP images confirms the above findings CTA HEAD Anterior circulation: Intracranial internal carotid arteries are patent with minimal calcified plaque. Anterior and middle cerebral arteries are patent. Anterior communicating artery is present. Posterior circulation:  Intracranial vertebral arteries are patent. The left vertebral is small in caliber proximally with calcified plaque. Improved caliber from the level of the PICA origin may be due to retrograde flow. Basilar artery is patent

## 2022-02-19 ENCOUNTER — Encounter (HOSPITAL_COMMUNITY): Payer: Self-pay

## 2022-02-19 ENCOUNTER — Telehealth (HOSPITAL_COMMUNITY): Payer: Self-pay

## 2022-02-19 NOTE — Progress Notes (Signed)
Verbal order given for PT 1x/wk x one week and 2x/wk x 3 weeks. ?

## 2022-02-23 LAB — BCR-ABL1, CML/ALL, PCR, QUANT: b3a2 transcript: 0.1019 %

## 2022-04-07 ENCOUNTER — Other Ambulatory Visit (HOSPITAL_COMMUNITY): Payer: Self-pay | Admitting: *Deleted

## 2022-04-07 DIAGNOSIS — C9210 Chronic myeloid leukemia, BCR/ABL-positive, not having achieved remission: Secondary | ICD-10-CM

## 2022-05-04 ENCOUNTER — Other Ambulatory Visit: Payer: Self-pay

## 2022-05-04 ENCOUNTER — Emergency Department: Payer: Medicare Other

## 2022-05-04 ENCOUNTER — Emergency Department
Admission: EM | Admit: 2022-05-04 | Discharge: 2022-05-04 | Disposition: A | Discharge status: Home / Self Care | Payer: Medicare Other | Attending: Emergency Medicine | Admitting: Emergency Medicine

## 2022-05-04 DIAGNOSIS — W19XXXA Unspecified fall, initial encounter: Secondary | ICD-10-CM

## 2022-05-04 DIAGNOSIS — Z7901 Long term (current) use of anticoagulants: Secondary | ICD-10-CM | POA: Diagnosis not present

## 2022-05-04 DIAGNOSIS — W01198A Fall on same level from slipping, tripping and stumbling with subsequent striking against other object, initial encounter: Secondary | ICD-10-CM | POA: Insufficient documentation

## 2022-05-04 DIAGNOSIS — S0990XA Unspecified injury of head, initial encounter: Secondary | ICD-10-CM

## 2022-05-04 DIAGNOSIS — G20 Parkinson's disease: Secondary | ICD-10-CM | POA: Diagnosis not present

## 2022-05-04 DIAGNOSIS — S0001XA Abrasion of scalp, initial encounter: Secondary | ICD-10-CM | POA: Diagnosis not present

## 2022-05-04 LAB — PROTIME-INR
INR: 1.6 — ABNORMAL HIGH (ref 0.8–1.2)
Prothrombin Time: 18.9 seconds — ABNORMAL HIGH (ref 11.4–15.2)

## 2022-05-04 LAB — BASIC METABOLIC PANEL
Anion gap: 9 (ref 5–15)
BUN: 19 mg/dL (ref 8–23)
CO2: 24 mmol/L (ref 22–32)
Calcium: 8.7 mg/dL — ABNORMAL LOW (ref 8.9–10.3)
Chloride: 109 mmol/L (ref 98–111)
Creatinine, Ser: 1.27 mg/dL — ABNORMAL HIGH (ref 0.61–1.24)
GFR, Estimated: 58 mL/min — ABNORMAL LOW (ref >60)
Glucose, Bld: 133 mg/dL — ABNORMAL HIGH (ref 70–99)
Potassium: 4.1 mmol/L (ref 3.5–5.1)
Sodium: 142 mmol/L (ref 135–145)

## 2022-05-04 LAB — CBC
HCT: 29.7 % — ABNORMAL LOW (ref 39.0–52.0)
Hemoglobin: 9.5 g/dL — ABNORMAL LOW (ref 13.0–17.0)
MCH: 32.6 pg (ref 26.0–34.0)
MCHC: 32 g/dL (ref 30.0–36.0)
MCV: 102.1 fL — ABNORMAL HIGH (ref 80.0–100.0)
Platelets: 186 10*3/uL (ref 150–400)
RBC: 2.91 MIL/uL — ABNORMAL LOW (ref 4.22–5.81)
RDW: 14.2 % (ref 11.5–15.5)
WBC: 6.4 10*3/uL (ref 4.0–10.5)
nRBC: 0 % (ref 0.0–0.2)

## 2022-05-04 NOTE — Discharge Instructions (Signed)
Please reach out to either primary care or oncologist to repeat your labs to check your hemoglobin.  If you see any active bleeding please return to the emergency department.

## 2022-05-04 NOTE — ED Provider Notes (Signed)
[facility] Provider Note  Patient Contact: 6:10 PM (approximate)   History   Fall   HPI  Kevin Love is a 76 y.o. male who presents the emergency department complaining of of a fall.  Patient does have a history of Parkinson's, states that neurology has been changing his carbidopa dosing, decreasing it and feels that he is more unsteady.  He had mechanical fall today and did hit his head.  He initially went to urgent care but was referred to the emergency department given his history of hitting his head for imaging.  Patient denies any headache, visual changes, unilateral weakness.  States that this fall was mechanical because he feels like he is stumbling more than normal.  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  He has had no bleeding to include epistaxis, hematic emesis, hematochezia, hematuria.  Patient states that he has not had labs in the last 3 to 4 months.  Again he does have an oncologist as well as a primary care provider.     Physical Exam   Triage Vital Signs: ED Triage Vitals  Enc Vitals Group     BP 05/04/22 1235 (!) 131/46     Pulse Rate 05/04/22 1235 (!) 59     Resp 05/04/22 1235 18     Temp 05/04/22 1237 98.2 F (36.8 C)     Temp Source 05/04/22 1237 Oral     SpO2 05/04/22 1235 95 %     Weight 05/04/22 1235 165 lb (74.8 kg)     Height 05/04/22 1235 '5\' 4"'$  (1.626 m)     Head Circumference --      Peak Flow --      Pain Score 05/04/22 1235 0     Pain Loc --      Pain Edu? --      Excl. in [place]? --     Most recent vital signs: Vitals:   05/04/22 1237 05/04/22 1644  BP:  (!) 130/50  Pulse:  60  Resp:  18  Temp: 98.2 F (36.8 C) 98 F (36.7 C)  SpO2:  96%     General: Alert and in no acute distress. Eyes:  PERRL. EOMI. Head: Slight abrasion to the scalp but no frank laceration. No hematoma, crepitus, battle signs or raccoon eyes.   Neck: No stridor. No  cervical spine tenderness to palpation.  Cardiovascular:  Good peripheral perfusion Respiratory: Normal respiratory effort without tachypnea or retractions. Lungs CTAB. Good air entry to the bases with no decreased or absent breath sounds Musculoskeletal: Full range of motion to all extremities.  Neurologic:  No gross focal neurologic deficits are appreciated.  Neurologically intact at this time. Skin:   No rash noted Other:   ED Results / Procedures / Treatments   Labs (all labs ordered are listed, but only abnormal results are displayed) Labs Reviewed  CBC - Abnormal; Notable for the following components:      Result Value   RBC 2.91 (*)    Hemoglobin 9.5 (*)    HCT 29.7 (*)    MCV 102.1 (*)    All other components within normal limits  BASIC METABOLIC PANEL - Abnormal; Notable for the following components:   Glucose, Bld 133 (*)    Creatinine, Ser 1.27 (*)    Calcium 8.7 (*)    GFR, Estimated 58 (*)    All other components within normal limits  PROTIME-INR - Abnormal; Notable for the following components:   Prothrombin Time 18.9 (*)    INR 1.6 (*)    All other components within normal limits     EKG     RADIOLOGY  I personally viewed, evaluated, and interpreted these images as part of my medical decision making, as well as reviewing the written report by the radiologist.  ED Provider Interpretation: No acute traumatic finding on CT scan of the head or cervical spine.  CT Head Wo Contrast  Result Date: 05/04/2022 CLINICAL DATA:  Unwitnessed fall this morning, laceration along the top of the. Anticoagulation therapy (Eliquis). EXAM: CT HEAD WITHOUT CONTRAST CT CERVICAL SPINE WITHOUT CONTRAST TECHNIQUE: Multidetector CT imaging of the head and cervical spine was performed following the standard protocol without intravenous contrast. Multiplanar CT image reconstructions of the cervical spine were also generated. RADIATION DOSE REDUCTION: This exam was performed according  to the departmental dose-optimization program which includes automated exposure control, adjustment of the mA and/or kV according to patient size and/or use of iterative reconstruction technique. COMPARISON:  Multiple exams, including CT head 02/12/2022 FINDINGS: CT HEAD FINDINGS Brain: The brainstem, cerebellum, cerebral peduncles, thalami, basal ganglia, basilar cisterns, and ventricular system appear within normal limits. 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 No intracranial hemorrhage, mass lesion, or acute CVA. Vascular: There is atherosclerotic calcification of the cavernous carotid arteries bilaterally. Skull: Small right frontal osteoma along the outer table, unchanged. Sinuses/Orbits: Chronic bilateral maxillary sinus despite maxillary antrostomies. Partial ethmoidectomies bilaterally. Chronic sinusitis involving remaining ethmoid air cells. Chronic left sphenoid sinusitis. Small right mastoid effusion.  Prosthetic left globe. Other: No supplemental non-categorized findings. CT CERVICAL SPINE FINDINGS Alignment: No vertebral subluxation is observed. Skull base and vertebrae: No fracture or acute bony findings. Soft tissues and spinal canal: Bilateral common carotid atherosclerotic calcification. Disc levels: Mild right and borderline left foraminal stenosis at C3-4 due to uncinate and facet spurring. Suspected mild central narrowing of the thecal sac at C3-4, C4-5, C5-6, and C6-7 due to calcified disc bulges/disc protrusions. Calcified portion of the C1 transverse ligament. Upper chest: Unremarkable Other: No supplemental non-categorized findings. IMPRESSION: 1. No acute intracranial findings are acute cervical spine findings. 2. Chronic paranasal sinusitis.  Small right mastoid effusion. 3. Carotid atherosclerotic calcification. 4. Suspected mild levels of cervical impingement due to spondylosis and degenerative disc  disease. 5. 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 Electronically Signed   By: Van Clines M.D.   On: 05/04/2022 13:28   CT Cervical Spine Wo Contrast  Result Date: 05/04/2022 CLINICAL DATA:  Unwitnessed fall this morning, laceration along the top of the. Anticoagulation therapy (Eliquis). EXAM: CT HEAD WITHOUT CONTRAST CT CERVICAL SPINE WITHOUT CONTRAST TECHNIQUE: Multidetector CT imaging of the head and cervical spine was performed following the standard protocol without intravenous contrast. Multiplanar CT image reconstructions of the cervical spine were also generated. RADIATION DOSE REDUCTION: This exam was performed according to the departmental dose-optimization program which includes automated exposure control, adjustment of the mA and/or kV according to patient size and/or use of iterative reconstruction technique. COMPARISON:  Multiple exams, including CT head 02/12/2022 FINDINGS: CT HEAD FINDINGS Brain: The brainstem, cerebellum, cerebral peduncles, thalami, basal ganglia, basilar cisterns, and ventricular system appear within normal limits. 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 No intracranial hemorrhage, mass lesion, or acute CVA. Vascular: There is atherosclerotic calcification of the  cavernous carotid arteries bilaterally. Skull: Small right frontal osteoma along the outer table, unchanged. Sinuses/Orbits: Chronic bilateral maxillary sinus despite maxillary antrostomies. Partial ethmoidectomies bilaterally. Chronic sinusitis involving remaining ethmoid air cells. Chronic left sphenoid sinusitis. Small right mastoid effusion.  Prosthetic left globe. Other: No supplemental non-categorized findings. CT CERVICAL SPINE FINDINGS Alignment: No vertebral subluxation is observed. Skull base and vertebrae: No fracture or acute bony  findings. Soft tissues and spinal canal: Bilateral common carotid atherosclerotic calcification. Disc levels: Mild right and borderline left foraminal stenosis at C3-4 due to uncinate and facet spurring. Suspected mild central narrowing of the thecal sac at C3-4, C4-5, C5-6, and C6-7 due to calcified disc bulges/disc protrusions. Calcified portion of the C1 transverse ligament. Upper chest: Unremarkable Other: No supplemental non-categorized findings. IMPRESSION: 1. No acute intracranial findings are acute cervical spine findings. 2. Chronic paranasal sinusitis.  Small right mastoid effusion. 3. Carotid atherosclerotic calcification. 4. Suspected mild levels of cervical impingement due to spondylosis and degenerative disc disease. 5. 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 Electronically Signed   By: Van Clines M.D.   On: 05/04/2022 13:28    PROCEDURES:  Critical Care performed: No  Procedures   MEDICATIONS ORDERED IN ED: Medications - No data to display   IMPRESSION / MDM / [place] / ED COURSE  I reviewed the triage vital signs and the nursing notes.                              Differential diagnosis includes, but is not limited to, skull fracture, intracranial hemorrhage, infectious process  Patient's presentation is most consistent with acute presentation with potential threat to life or bodily function.   Patient's diagnosis is consistent with fall, minor head injury.  Patient presents to the ED for evaluation for head injury.  He has a history of Parkinson's, states that his neurologist has been decreasing his carbidopa and he has had more symptoms.  Patient had a mechanical fall that did result in him hitting his head.  No loss of consciousness.  Neurologically intact.  Imaging and labs were performed with no evidence of acute traumatic finding on CT scan of the head or cervical spine.   Patient's labs did note a lower hemoglobin than his previous lab work.  Patient has had no active bleeding.  No signs or symptoms concerning for ongoing bleeding.  He does have CML and is receiving chemotherapy it does appear on previous oncology note that the patient has had some marrow issues with his CML.  At this point I have recommended close follow-up with your primary care or his hematologist/oncologist for the hemoglobin.  If he becomes weak, dizzy, has bleeding return to the ED.  However at this time he does not meet criteria for transfusion.  Patient is aware of these findings and follow-up.  At this time patient is stable for discharge..  Patient is given ED precautions to return to the ED for any worsening or new symptoms.        FINAL CLINICAL IMPRESSION(S) / ED DIAGNOSES   Final diagnoses:  Fall, initial encounter  Minor head injury, initial encounter     Rx / DC Orders   ED Discharge Orders     None        Note:  This document was prepared using Dragon voice recognition software and may include unintentional dictation errors.   Lorenda Grecco,  Charline Bills, PA-C 05/04/22 2055    Rada Hay, MD 05/04/22 [phone]

## 2022-05-04 NOTE — ED Triage Notes (Signed)
Pt to ED via [place] walk in clinic. Pt states he had an unwitnessed fall this morning approximately 2hrs PTA. Pt denies LOC. Pt has laceration to the top of head. Bleeding controlled at this time. Pt denies blurry vision, HA, N/V, CP or SOB. Pt reports he just got off balance and fell. Pt on eliquis.

## 2022-05-04 NOTE — ED Provider Triage Note (Signed)
Emergency Medicine Provider Triage Evaluation Note  Kevin Love , a 76 y.o. male  was evaluated in triage.  Pt complains of head injury and scalp laceration that occurred when he fell against a brick wall approximately 2 hours ago.  Fall was unwitnessed.  Patient is on Eliquis and initially went to [facility] where he was sent to the emergency department.  Denies any other injuries.  Review of Systems  Positive: Laceration scalp + Negative: No N, V or vision changes.  Physical Exam  BP (!) 131/46 (BP Location: Right Arm)   Pulse (!) 59   Resp 18   Ht '5\' 4"'$  (1.626 m)   Wt 74.8 kg   SpO2 95%   BMI 28.32 kg/m  Gen:   Awake, no distress  alert and answers questions. Resp:  Normal effort.  Lungs are clear bilaterally. MSK:   Moves extremities without difficulty able move upper and lower extremities without any difficulty. Other:  Small laceration without active bleeding noted to the scalp.  Medical Decision Making  Medically screening exam initiated at 12:37 PM.  Appropriate orders placed.  Kevin Love was informed that the remainder of the evaluation will be completed by another provider, this initial triage assessment does not replace that evaluation, and the importance of remaining in the ED until their evaluation is complete.     Johnn Hai, PA-C 05/04/22 1243

## 2022-05-05 ENCOUNTER — Other Ambulatory Visit (HOSPITAL_COMMUNITY): Payer: Self-pay | Admitting: *Deleted

## 2022-05-05 ENCOUNTER — Telehealth (HOSPITAL_COMMUNITY): Payer: Self-pay | Admitting: *Deleted

## 2022-05-05 DIAGNOSIS — C9210 Chronic myeloid leukemia, BCR/ABL-positive, not having achieved remission: Secondary | ICD-10-CM

## 2022-05-05 NOTE — Telephone Encounter (Signed)
Received call from wife [name] to make [place] aware that patient had fallen yesterday and gone to the ER.  Hgb noted to be 9.5 and they were advised to call our office this morning to notify us of significant drop.  Denies any bleeding or SOB at this time. Case discussed with Dr. Delton Coombes and will make appointment to follow up with him in one week with labs.  Appointments made and patient aware.

## 2022-05-12 ENCOUNTER — Other Ambulatory Visit (HOSPITAL_COMMUNITY): Payer: Medicare Other

## 2022-05-12 ENCOUNTER — Inpatient Hospital Stay (HOSPITAL_COMMUNITY): Payer: Medicare Other

## 2022-05-12 ENCOUNTER — Inpatient Hospital Stay (HOSPITAL_COMMUNITY): Payer: Medicare Other | Attending: Hematology | Admitting: Hematology

## 2022-05-12 VITALS — BP 134/54 | HR 66 | Temp 98.2°F | Resp 18 | Ht 64.0 in | Wt 165.6 lb

## 2022-05-12 DIAGNOSIS — C9210 Chronic myeloid leukemia, BCR/ABL-positive, not having achieved remission: Secondary | ICD-10-CM | POA: Diagnosis present

## 2022-05-12 DIAGNOSIS — R0602 Shortness of breath: Secondary | ICD-10-CM | POA: Insufficient documentation

## 2022-05-12 DIAGNOSIS — Z809 Family history of malignant neoplasm, unspecified: Secondary | ICD-10-CM | POA: Insufficient documentation

## 2022-05-12 DIAGNOSIS — M25472 Effusion, left ankle: Secondary | ICD-10-CM | POA: Insufficient documentation

## 2022-05-12 DIAGNOSIS — M25471 Effusion, right ankle: Secondary | ICD-10-CM | POA: Insufficient documentation

## 2022-05-12 DIAGNOSIS — Z8673 Personal history of transient ischemic attack (TIA), and cerebral infarction without residual deficits: Secondary | ICD-10-CM | POA: Diagnosis not present

## 2022-05-12 DIAGNOSIS — J3489 Other specified disorders of nose and nasal sinuses: Secondary | ICD-10-CM | POA: Diagnosis not present

## 2022-05-12 DIAGNOSIS — Z87891 Personal history of nicotine dependence: Secondary | ICD-10-CM | POA: Insufficient documentation

## 2022-05-12 DIAGNOSIS — Z79899 Other long term (current) drug therapy: Secondary | ICD-10-CM | POA: Diagnosis not present

## 2022-05-12 LAB — CBC WITH DIFFERENTIAL/PLATELET
Abs Immature Granulocytes: 0.07 10*3/uL (ref 0.00–0.07)
Basophils Absolute: 0.1 10*3/uL (ref 0.0–0.1)
Basophils Relative: 1 %
Eosinophils Absolute: 0.1 10*3/uL (ref 0.0–0.5)
Eosinophils Relative: 2 %
HCT: 31.4 % — ABNORMAL LOW (ref 39.0–52.0)
Hemoglobin: 10 g/dL — ABNORMAL LOW (ref 13.0–17.0)
Immature Granulocytes: 1 %
Lymphocytes Relative: 19 %
Lymphs Abs: 1.2 10*3/uL (ref 0.7–4.0)
MCH: 32.5 pg (ref 26.0–34.0)
MCHC: 31.8 g/dL (ref 30.0–36.0)
MCV: 101.9 fL — ABNORMAL HIGH (ref 80.0–100.0)
Monocytes Absolute: 0.7 10*3/uL (ref 0.1–1.0)
Monocytes Relative: 10 %
Neutro Abs: 4.3 10*3/uL (ref 1.7–7.7)
Neutrophils Relative %: 67 %
Platelets: 172 10*3/uL (ref 150–400)
RBC: 3.08 MIL/uL — ABNORMAL LOW (ref 4.22–5.81)
RDW: 14.2 % (ref 11.5–15.5)
WBC: 6.5 10*3/uL (ref 4.0–10.5)
nRBC: 0 % (ref 0.0–0.2)

## 2022-05-12 LAB — RETICULOCYTES
Immature Retic Fract: 19.3 % — ABNORMAL HIGH (ref 2.3–15.9)
RBC.: 3.07 MIL/uL — ABNORMAL LOW (ref 4.22–5.81)
Retic Count, Absolute: 74.3 10*3/uL (ref 19.0–186.0)
Retic Ct Pct: 2.4 % (ref 0.4–3.1)

## 2022-05-12 LAB — IRON AND TIBC
Iron: 38 ug/dL — ABNORMAL LOW (ref 45–182)
Saturation Ratios: 17 % — ABNORMAL LOW (ref 17.9–39.5)
TIBC: 218 ug/dL — ABNORMAL LOW (ref 250–450)
UIBC: 180 ug/dL

## 2022-05-12 LAB — LACTATE DEHYDROGENASE: LDH: 143 U/L (ref 98–192)

## 2022-05-12 LAB — DIRECT ANTIGLOBULIN TEST (NOT AT ARMC)
DAT, IgG: NEGATIVE
DAT, complement: NEGATIVE

## 2022-05-12 LAB — FERRITIN: Ferritin: 152 ng/mL (ref 24–336)

## 2022-05-12 LAB — FOLATE: Folate: 20 ng/mL (ref >5.9)

## 2022-05-12 LAB — VITAMIN B12: Vitamin B-12: 404 pg/mL (ref 180–914)

## 2022-05-12 MED ORDER — FUROSEMIDE 20 MG PO TABS: 20.0000 mg | ORAL_TABLET | Freq: Every day | ORAL | 1 refills | Status: AC | PRN

## 2022-05-12 NOTE — Progress Notes (Signed)
Patient is taking Sprycel as prescribed.  He has not missed any doses and reports no side effects at this time.   

## 2022-05-12 NOTE — Progress Notes (Signed)
Kevin Love, [name]   CLINIC:  Medical Oncology/Hematology  PCP:  Kevin Hire, MD [address] / [place] [address]  [phone]  REASON FOR VISIT:  Follow-up for CML  PRIOR THERAPY: none  CURRENT THERAPY: Sprycel 20 mg daily  INTERVAL HISTORY:  [name], a 76 y.o. 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 Kevin Love was last seen on 02/17/2022.  Today he reports feeling good, and he is accompanied by his wife. He denies hematochezia and black stools. He has swelling in his ankles and feet. He reports he has been having occasional low-grade fevers, the highest of which was 101.5 F, occurring about once daily. He had a sinus infections for which he was started on doxycycline 2 weeks ago. He reports continued rhinorrhea in the morning and SOB at night. He continues to take Sprycel. He reports decreasing vision. He reports constipation for which he is using milk of magnesia prn. His wife reports she is having to supports him while he walks.   REVIEW OF SYSTEMS:  Review of Systems  Constitutional:  Positive for fever. Negative for appetite change and fatigue.  Eyes:  Positive for eye problems.  Respiratory:  Positive for shortness of breath.   Cardiovascular:  Positive for leg swelling (feet).  Gastrointestinal:  Positive for constipation.  All other systems reviewed and are negative.   PAST MEDICAL/SURGICAL HISTORY:  Past Medical History:  Diagnosis Date   Parkinson disease ([place]) 07/25/2020   No past surgical history on file.  SOCIAL HISTORY:  Social History   Socioeconomic History   Marital status: Married    Spouse name: Not on file   Number of children: Not on file   Years of education: Not on file   Highest education level: Not on file  Occupational History   Not on file  Tobacco Use   Smoking status: Never   Smokeless tobacco: Never  Substance and Sexual Activity   Alcohol use: Never    Drug use: Never   Sexual activity: Not Currently  Other Topics Concern   Not on file  Social History Narrative   Not on file   Social Determinants of Health   Financial Resource Strain: Low Risk  (08/22/2020)   Overall Financial Resource Strain (CARDIA)    Difficulty of Paying Living Expenses: Not hard at all  Food Insecurity: No Food Insecurity (08/22/2020)   Hunger Vital Sign    Worried About Running Out of Food in the Last Year: Never true    [place] in the Last Year: Never true  Transportation Needs: No Transportation Needs (08/22/2020)   PRAPARE - Hydrologist (Medical): No    Lack of Transportation (Non-Medical): No  Physical Activity: Insufficiently Active (08/22/2020)   Exercise Vital Sign    Days of Exercise per Week: 7 days    Minutes of Exercise per Session: 10 min  Stress: No Stress Concern Present (08/22/2020)   [place]    Feeling of Stress : Not at all  Social Connections: Moderately Integrated (08/22/2020)   Social Connection and Isolation Panel [NHANES]    Frequency of Communication with Friends and Family: Three times a week    Frequency of Social Gatherings with Friends and Family: Once a week    Attends Religious Services: More than 4 times per year    Active Member of Clubs or  Organizations: No    Attends Archivist Meetings: Never    Marital Status: Married  Human resources officer Violence: Not At Risk (08/22/2020)   Humiliation, Afraid, Rape, and Kick questionnaire    Fear of Current or Ex-Partner: No    Emotionally Abused: No    Physically Abused: No    Sexually Abused: No    FAMILY HISTORY:  No family history on file.  CURRENT MEDICATIONS:  Current Outpatient Medications  Medication Sig Dispense Refill   amiodarone (PACERONE) 400 MG tablet Take 1 tablet (400 mg total) by mouth daily. 60 tablet 0   apixaban (ELIQUIS) 5 MG TABS tablet Take 1  tablet (5 mg total) by mouth 2 (two) times daily. 60 tablet 0   atorvastatin (LIPITOR) 80 MG tablet Take 1 tablet (80 mg total) by mouth daily. 30 tablet 0   carbidopa-levodopa (SINEMET IR) 25-100 MG tablet Take 1 tablet by mouth 4 (four) times daily.     Cholecalciferol 50 MCG (2000 UT) CAPS Take 2,000 Units by mouth daily.     clonazePAM (KLONOPIN) 0.5 MG tablet Take 0.5 mg by mouth daily.     dasatinib (SPRYCEL) 20 MG tablet Take 1 tablet (20 mg total) by mouth daily. 30 tablet 11   latanoprost (XALATAN) 0.005 % ophthalmic solution Place 1 drop into the right eye at bedtime.     lisinopril (ZESTRIL) 10 MG tablet Take 10 mg by mouth daily.     Multiple Vitamins-Minerals (CENTRUM SILVER 50+MEN PO) Take 1 tablet by mouth daily.     tamsulosin (FLOMAX) 0.4 MG CAPS capsule Take 0.4 mg by mouth daily.     No current facility-administered medications for this visit.    ALLERGIES:  Allergies  Allergen Reactions   Penicillins Rash    PHYSICAL EXAM:  Performance status (ECOG): 1 - Symptomatic but completely ambulatory  Vitals:   05/12/22 1006  BP: (!) 134/54  Pulse: 66  Resp: 18  Temp: 98.2 F (36.8 C)  SpO2: 97%   Wt Readings from Last 3 Encounters:  05/12/22 165 lb 9.6 oz (75.1 kg)  05/04/22 165 lb (74.8 kg)  02/17/22 157 lb 4.8 oz (71.4 kg)   Physical Exam Vitals reviewed.  Constitutional:      Appearance: Normal appearance.  Cardiovascular:     Rate and Rhythm: Normal rate and regular rhythm.     Pulses: Normal pulses.     Heart sounds: Normal heart sounds.  Pulmonary:     Effort: Pulmonary effort is normal.     Breath sounds: Normal breath sounds.  Abdominal:     Palpations: Abdomen is soft. There is no hepatomegaly, splenomegaly or mass.     Tenderness: There is no abdominal tenderness.  Musculoskeletal:     Right lower leg: 1+ Edema present.     Left lower leg: 1+ Edema present.  Neurological:     General: No focal deficit present.     Mental Status: He is  alert and oriented to person, place, and time.  Psychiatric:        Mood and Affect: Mood normal.        Behavior: Behavior normal.     LABORATORY DATA:  I have reviewed the labs as listed.     Latest Ref Rng & Units 05/12/2022    9:48 AM 05/04/2022   12:38 PM 02/17/2022    2:18 PM  CBC  WBC 4.0 - 10.5 K/uL 6.5  6.4  8.3   Hemoglobin 13.0 - 17.0 g/dL 10.0  9.5  13.0   Hematocrit 39.0 - 52.0 % 31.4  29.7  38.2   Platelets 150 - 400 K/uL 172  186  226       Latest Ref Rng & Units 05/04/2022   12:38 PM 02/17/2022    2:18 PM 02/12/2022   10:08 AM  CMP  Glucose 70 - 99 mg/dL 133  149  167   BUN 8 - 23 mg/dL 19  21  16    Creatinine 0.61 - 1.24 mg/dL 1.27  0.94  0.77   Sodium 135 - 145 mmol/L 142  138  137   Potassium 3.5 - 5.1 mmol/L 4.1  4.0  4.2   Chloride 98 - 111 mmol/L 109  108  106   CO2 22 - 32 mmol/L 24  25  26    Calcium 8.9 - 10.3 mg/dL 8.7  9.3  9.2   Total Protein 6.5 - 8.1 g/dL  6.7  6.9   Total Bilirubin 0.3 - 1.2 mg/dL  0.7  0.8   Alkaline Phos 38 - 126 U/L  71  68   AST 15 - 41 U/L  18  20   ALT 0 - 44 U/L  10  11       Component Value Date/Time   RBC 3.08 (L) 05/12/2022 0948   RBC 3.07 (L) 05/12/2022 0948   MCV 101.9 (H) 05/12/2022 0948   MCH 32.5 05/12/2022 0948   MCHC 31.8 05/12/2022 0948   RDW 14.2 05/12/2022 0948   LYMPHSABS 1.2 05/12/2022 0948   MONOABS 0.7 05/12/2022 0948   EOSABS 0.1 05/12/2022 0948   BASOSABS 0.1 05/12/2022 0948    DIAGNOSTIC IMAGING:  I have independently reviewed the scans and discussed with the patient. CT Head Wo Contrast  Result Date: 05/04/2022 CLINICAL DATA:  Unwitnessed fall this morning, laceration along the top of the. Anticoagulation therapy (Eliquis). EXAM: CT HEAD WITHOUT CONTRAST CT CERVICAL SPINE WITHOUT CONTRAST TECHNIQUE: Multidetector CT imaging of the head and cervical spine was performed following the standard protocol without intravenous contrast. Multiplanar CT image reconstructions of the cervical spine were  also generated. RADIATION DOSE REDUCTION: This exam was performed according to the departmental dose-optimization program which includes automated exposure control, adjustment of the mA and/or kV according to patient size and/or use of iterative reconstruction technique. COMPARISON:  Multiple exams, including CT head 02/12/2022 FINDINGS: CT HEAD FINDINGS Brain: The brainstem, cerebellum, cerebral peduncles, thalami, basal ganglia, basilar cisterns, and ventricular system appear within normal limits. 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 No intracranial hemorrhage, mass lesion, or acute CVA. Vascular: There is atherosclerotic calcification of the cavernous carotid arteries bilaterally. Skull: Small right frontal osteoma along the outer table, unchanged. Sinuses/Orbits: Chronic bilateral maxillary sinus despite maxillary antrostomies. Partial ethmoidectomies bilaterally. Chronic sinusitis involving remaining ethmoid air cells. Chronic left sphenoid sinusitis. Small right mastoid effusion.  Prosthetic left globe. Other: No supplemental non-categorized findings. CT CERVICAL SPINE FINDINGS Alignment: No vertebral subluxation is observed. Skull base and vertebrae: No fracture or acute bony findings. Soft tissues and spinal canal: Bilateral common carotid atherosclerotic calcification. Disc levels: Mild right and borderline left foraminal stenosis at C3-4 due to uncinate and facet spurring. Suspected mild central narrowing of the thecal sac at C3-4, C4-5, C5-6, and C6-7 due to calcified disc bulges/disc protrusions. Calcified portion of the C1 transverse ligament. Upper chest: Unremarkable Other: No supplemental non-categorized findings. IMPRESSION: 1. No acute intracranial findings are acute cervical spine findings. 2. Chronic  paranasal sinusitis.  Small right mastoid effusion. 3. Carotid atherosclerotic calcification. 4. Suspected mild  levels of cervical impingement due to spondylosis and degenerative disc disease. 5. 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 Electronically Signed   By: Van Clines M.D.   On: 05/04/2022 13:28   CT Cervical Spine Wo Contrast  Result Date: 05/04/2022 CLINICAL DATA:  Unwitnessed fall this morning, laceration along the top of the. Anticoagulation therapy (Eliquis). EXAM: CT HEAD WITHOUT CONTRAST CT CERVICAL SPINE WITHOUT CONTRAST TECHNIQUE: Multidetector CT imaging of the head and cervical spine was performed following the standard protocol without intravenous contrast. Multiplanar CT image reconstructions of the cervical spine were also generated. RADIATION DOSE REDUCTION: This exam was performed according to the departmental dose-optimization program which includes automated exposure control, adjustment of the mA and/or kV according to patient size and/or use of iterative reconstruction technique. COMPARISON:  Multiple exams, including CT head 02/12/2022 FINDINGS: CT HEAD FINDINGS Brain: The brainstem, cerebellum, cerebral peduncles, thalami, basal ganglia, basilar cisterns, and ventricular system appear within normal limits. 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 No intracranial hemorrhage, mass lesion, or acute CVA. Vascular: There is atherosclerotic calcification of the cavernous carotid arteries bilaterally. Skull: Small right frontal osteoma along the outer table, unchanged. Sinuses/Orbits: Chronic bilateral maxillary sinus despite maxillary antrostomies. Partial ethmoidectomies bilaterally. Chronic sinusitis involving remaining ethmoid air cells. Chronic left sphenoid sinusitis. Small right mastoid effusion.  Prosthetic left globe. Other: No supplemental non-categorized findings. CT CERVICAL SPINE FINDINGS Alignment: No vertebral subluxation  is observed. Skull base and vertebrae: No fracture or acute bony findings. Soft tissues and spinal canal: Bilateral common carotid atherosclerotic calcification. Disc levels: Mild right and borderline left foraminal stenosis at C3-4 due to uncinate and facet spurring. Suspected mild central narrowing of the thecal sac at C3-4, C4-5, C5-6, and C6-7 due to calcified disc bulges/disc protrusions. Calcified portion of the C1 transverse ligament. Upper chest: Unremarkable Other: No supplemental non-categorized findings. IMPRESSION: 1. No acute intracranial findings are acute cervical spine findings. 2. Chronic paranasal sinusitis.  Small right mastoid effusion. 3. Carotid atherosclerotic calcification. 4. Suspected mild levels of cervical impingement due to spondylosis and degenerative disc disease. 5. 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 Electronically Signed   By: Van Clines M.D.   On: 05/04/2022 13:28     ASSESSMENT:  1.  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 is evaluated at the request of Dr. Lonia Mad for hyperleukocytosis. -CBC on 07/23/2020 shows white count 148.3 with differential showing neutrophils, bands, promyelocytes, myelocytes and occasional blasts.  Platelet count was 138 and hemoglobin 12.9. -CBC on 03/05/2020 shows white count 15.2 with hemoglobin 15.4 and normal platelet count. -CBC in November 2020 showed white count 10.3 with normal hemoglobin and platelets. -His wife noted 10 pound weight loss in the last 6 months. -Patient reports easy bruising in the last 6 months, mainly on the upper extremities.  Denies any fevers or night sweats.  No recent infections or hospitalizations. -Last hospitalization was approximately 3 to 4 years ago for TIA. -Dasatinib 70 mg daily started on 08/13/2020.  Held on 09/09/2020 due to fluid retention. -Bone marrow biopsy on 08/13/2020 with karyotype 80, XY,t(9;22),  hypercellular bone marrow with morphological features consistent with CML. -2D echo on 09/10/2020 at [facility] showed normal LV size and function with EF 55%.  Mild concentric hypertrophy.  Normal right ventricle size  with function.  Mild to moderate MR.  Trivial pericardial effusion.  No pericardial tamponade. -Dasatinib 20 mg daily started on 10/02/2020.   2.  Social/family history: -He lives at home with his wife.  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  He quit smoking in December 22, 1988. -Mother died of cancer, type not known to the patient.   PLAN:  1.  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 - Recent fall on 05/04/2022 and presented to the ER.  Hemoglobin was down to 9.5 with MCV 102.  Creatinine was also high at 1.27 at that time. - He reported sinus infection about 4 weeks ago.  He also reported low-grade temperatures in 2 weeks.  He was reportedly started on doxycycline. - Reviewed labs today which showed hemoglobin is 10.0 with MCV 101.9.  T05, folic acid were normal.  Ferritin was 152 and percent saturation of 17.  Direct Coombs test was negative.  LDH was normal. - He has 1+ edema in the legs.  We will start him on Lasix 20 mg in the mornings as needed. - BCR/ABL by quantitative PCR on 02/17/2022 shows B3 A2 transcript 0.1019%. - I will reevaluate him in 4 weeks to see if his hemoglobin is improving.   2.  CVA/A-fib: -Continue Eliquis and amiodarone.   3.  Memory problems: - Continue memantine 10 mg 2 tablets daily.   4.  Difficulty urination: - Continue Flomax daily.   5.  Parkinson's disease: - He has seen neurologist in [place] who does not think he has Parkinson's and is tapering him off of carbidopa.  Orders placed this encounter:  No orders of the defined types were placed in this encounter.    Derek Jack, MD [place] [phone]   I,  Thana Ates, am acting as a scribe for Dr. Derek Jack.  I, Derek Jack MD, have reviewed the above documentation for accuracy and completeness, and I agree with the above.

## 2022-05-12 NOTE — Patient Instructions (Addendum)
[place] [facility] Discharge Instructions   You were seen and examined today by Dr. Delton Coombes.  He reviewed the results of the available lab work. Your hemoglobin has improved to 10, up from 9.5. We will check iron studies to see if your iron is low. If it is, this can be causing your hemoglobin to be low as well. We can give you IV iron if it indicated. In the meantime you can take iron pills daily. You will need to take a stool softener with this.   We will call you with the remaining results of your lab work and follow-up will be determined.      Thank you for choosing [place] at [facility] to provide your oncology and hematology care.  To afford each patient quality time with our provider, please arrive at least 15 minutes before your scheduled appointment time.   If you have a lab appointment with the [facility] please come in thru the Main Entrance and check in at the main information desk.  You need to re-schedule your appointment should you arrive 10 or more minutes late.  We strive to give you quality time with our providers, and arriving late affects you and other patients whose appointments are after yours.  Also, if you no show three or more times for appointments you may be dismissed from the clinic at the providers discretion.     Again, thank you for choosing [facility].  Our hope is that these requests will decrease the amount of time that you wait before being seen by our physicians.       _____________________________________________________________  Should you have questions after your visit to [facility], please contact our office at [phone] and follow the prompts.  Our office hours are 8:00 a.m. and 4:30 p.m. Monday - Friday.  Please note that voicemails left after 4:00 p.m. may not be returned until the following business day.  We are closed weekends and major holidays.  You do  have access to a nurse 24-7, just call the main number to the clinic [phone] and do not press any options, hold on the line and a nurse will answer the phone.    For prescription refill requests, have your pharmacy contact our office and allow 72 hours.    Due to Covid, you will need to wear a mask upon entering the hospital. If you do not have a mask, a mask will be given to you at the Main Entrance upon arrival. For doctor visits, patients may have 1 support person age 53 or older with them. For treatment visits, patients can not have anyone with them due to social distancing guidelines and our immunocompromised population.

## 2022-05-13 ENCOUNTER — Other Ambulatory Visit (HOSPITAL_COMMUNITY): Payer: Self-pay

## 2022-05-13 DIAGNOSIS — C9210 Chronic myeloid leukemia, BCR/ABL-positive, not having achieved remission: Secondary | ICD-10-CM

## 2022-05-13 LAB — HAPTOGLOBIN: Haptoglobin: 210 mg/dL (ref 34–355)

## 2022-05-13 NOTE — Progress Notes (Signed)
Message received from Dr. Delton Coombes- Ralene Gasparyan, please place orders for CBC, CMP, BCR/ABL by quantitative PCR, ferritin, iron panel, LDH, reticulocytes, MMA and copper.

## 2022-05-26 ENCOUNTER — Ambulatory Visit (HOSPITAL_COMMUNITY): Payer: Medicare Other | Admitting: Hematology

## 2022-05-26 ENCOUNTER — Other Ambulatory Visit (HOSPITAL_COMMUNITY): Payer: Medicare Other

## 2022-06-01 ENCOUNTER — Emergency Department: Payer: Medicare Other

## 2022-06-01 ENCOUNTER — Other Ambulatory Visit: Payer: Self-pay

## 2022-06-01 ENCOUNTER — Inpatient Hospital Stay
Admission: EM | Admit: 2022-06-01 | Discharge: 2022-06-04 | DRG: 378 | Disposition: A | Discharge status: Home / Self Care | Payer: Medicare Other | Source: Ambulatory Visit | Attending: Internal Medicine | Admitting: Internal Medicine

## 2022-06-01 DIAGNOSIS — C9210 Chronic myeloid leukemia, BCR/ABL-positive, not having achieved remission: Secondary | ICD-10-CM | POA: Diagnosis present

## 2022-06-01 DIAGNOSIS — G8929 Other chronic pain: Secondary | ICD-10-CM | POA: Diagnosis present

## 2022-06-01 DIAGNOSIS — T45515A Adverse effect of anticoagulants, initial encounter: Secondary | ICD-10-CM | POA: Diagnosis present

## 2022-06-01 DIAGNOSIS — K922 Gastrointestinal hemorrhage, unspecified: Secondary | ICD-10-CM | POA: Diagnosis present

## 2022-06-01 DIAGNOSIS — G20 Parkinson's disease: Secondary | ICD-10-CM | POA: Diagnosis present

## 2022-06-01 DIAGNOSIS — D6832 Hemorrhagic disorder due to extrinsic circulating anticoagulants: Secondary | ICD-10-CM | POA: Diagnosis present

## 2022-06-01 DIAGNOSIS — D509 Iron deficiency anemia, unspecified: Secondary | ICD-10-CM | POA: Diagnosis present

## 2022-06-01 DIAGNOSIS — G20A1 Parkinson's disease without dyskinesia, without mention of fluctuations: Secondary | ICD-10-CM | POA: Diagnosis present

## 2022-06-01 DIAGNOSIS — R7989 Other specified abnormal findings of blood chemistry: Secondary | ICD-10-CM | POA: Diagnosis present

## 2022-06-01 DIAGNOSIS — Z8673 Personal history of transient ischemic attack (TIA), and cerebral infarction without residual deficits: Secondary | ICD-10-CM

## 2022-06-01 DIAGNOSIS — Z6826 Body mass index (BMI) 26.0-26.9, adult: Secondary | ICD-10-CM

## 2022-06-01 DIAGNOSIS — E663 Overweight: Secondary | ICD-10-CM | POA: Diagnosis present

## 2022-06-01 DIAGNOSIS — I4891 Unspecified atrial fibrillation: Secondary | ICD-10-CM | POA: Diagnosis present

## 2022-06-01 DIAGNOSIS — E785 Hyperlipidemia, unspecified: Secondary | ICD-10-CM | POA: Diagnosis present

## 2022-06-01 DIAGNOSIS — Z7901 Long term (current) use of anticoagulants: Secondary | ICD-10-CM

## 2022-06-01 DIAGNOSIS — M549 Dorsalgia, unspecified: Secondary | ICD-10-CM | POA: Diagnosis present

## 2022-06-01 DIAGNOSIS — N179 Acute kidney failure, unspecified: Secondary | ICD-10-CM | POA: Diagnosis present

## 2022-06-01 DIAGNOSIS — I10 Essential (primary) hypertension: Secondary | ICD-10-CM | POA: Diagnosis present

## 2022-06-01 DIAGNOSIS — C9510 Chronic leukemia of unspecified cell type not having achieved remission: Secondary | ICD-10-CM | POA: Diagnosis present

## 2022-06-01 DIAGNOSIS — E8770 Fluid overload, unspecified: Secondary | ICD-10-CM | POA: Diagnosis present

## 2022-06-01 DIAGNOSIS — K5521 Angiodysplasia of colon with hemorrhage: Secondary | ICD-10-CM | POA: Diagnosis present

## 2022-06-01 DIAGNOSIS — K641 Second degree hemorrhoids: Secondary | ICD-10-CM | POA: Diagnosis present

## 2022-06-01 DIAGNOSIS — R001 Bradycardia, unspecified: Secondary | ICD-10-CM | POA: Diagnosis present

## 2022-06-01 DIAGNOSIS — R0989 Other specified symptoms and signs involving the circulatory and respiratory systems: Secondary | ICD-10-CM | POA: Diagnosis present

## 2022-06-01 DIAGNOSIS — I509 Heart failure, unspecified: Secondary | ICD-10-CM | POA: Diagnosis not present

## 2022-06-01 DIAGNOSIS — Z880 Allergy status to penicillin: Secondary | ICD-10-CM

## 2022-06-01 DIAGNOSIS — I480 Paroxysmal atrial fibrillation: Secondary | ICD-10-CM | POA: Diagnosis present

## 2022-06-01 DIAGNOSIS — D62 Acute posthemorrhagic anemia: Secondary | ICD-10-CM | POA: Diagnosis present

## 2022-06-01 DIAGNOSIS — D649 Anemia, unspecified: Secondary | ICD-10-CM

## 2022-06-01 DIAGNOSIS — Z87891 Personal history of nicotine dependence: Secondary | ICD-10-CM | POA: Diagnosis not present

## 2022-06-01 DIAGNOSIS — R1310 Dysphagia, unspecified: Secondary | ICD-10-CM | POA: Diagnosis present

## 2022-06-01 DIAGNOSIS — Z86010 Personal history of colonic polyps: Secondary | ICD-10-CM | POA: Diagnosis not present

## 2022-06-01 DIAGNOSIS — H409 Unspecified glaucoma: Secondary | ICD-10-CM | POA: Diagnosis present

## 2022-06-01 DIAGNOSIS — R0602 Shortness of breath: Secondary | ICD-10-CM | POA: Diagnosis present

## 2022-06-01 DIAGNOSIS — F419 Anxiety disorder, unspecified: Secondary | ICD-10-CM | POA: Diagnosis present

## 2022-06-01 DIAGNOSIS — Z79899 Other long term (current) drug therapy: Secondary | ICD-10-CM

## 2022-06-01 DIAGNOSIS — Z7969 Long term (current) use of other immunomodulators and immunosuppressants: Secondary | ICD-10-CM

## 2022-06-01 HISTORY — DX: Anemia, unspecified: D64.9

## 2022-06-01 HISTORY — DX: Unspecified atrial fibrillation: I48.91

## 2022-06-01 HISTORY — DX: Cerebral infarction, unspecified: I63.9

## 2022-06-01 HISTORY — DX: Unspecified glaucoma: H40.9

## 2022-06-01 LAB — CBC WITH DIFFERENTIAL/PLATELET
Abs Immature Granulocytes: 0.04 10*3/uL (ref 0.00–0.07)
Basophils Absolute: 0 10*3/uL (ref 0.0–0.1)
Basophils Relative: 1 %
Eosinophils Absolute: 0.2 10*3/uL (ref 0.0–0.5)
Eosinophils Relative: 3 %
HCT: 23.8 % — ABNORMAL LOW (ref 39.0–52.0)
Hemoglobin: 7.3 g/dL — ABNORMAL LOW (ref 13.0–17.0)
Immature Granulocytes: 1 %
Lymphocytes Relative: 20 %
Lymphs Abs: 1.3 10*3/uL (ref 0.7–4.0)
MCH: 31.1 pg (ref 26.0–34.0)
MCHC: 30.7 g/dL (ref 30.0–36.0)
MCV: 101.3 fL — ABNORMAL HIGH (ref 80.0–100.0)
Monocytes Absolute: 0.7 10*3/uL (ref 0.1–1.0)
Monocytes Relative: 11 %
Neutro Abs: 4.5 10*3/uL (ref 1.7–7.7)
Neutrophils Relative %: 64 %
Platelets: 222 10*3/uL (ref 150–400)
RBC: 2.35 MIL/uL — ABNORMAL LOW (ref 4.22–5.81)
RDW: 15.1 % (ref 11.5–15.5)
WBC: 6.8 10*3/uL (ref 4.0–10.5)
nRBC: 0 % (ref 0.0–0.2)

## 2022-06-01 LAB — HEPATIC FUNCTION PANEL
ALT: 30 U/L (ref 0–44)
AST: 23 U/L (ref 15–41)
Albumin: 3.4 g/dL — ABNORMAL LOW (ref 3.5–5.0)
Alkaline Phosphatase: 108 U/L (ref 38–126)
Bilirubin, Direct: <0.1 mg/dL (ref 0.0–0.2)
Total Bilirubin: 0.5 mg/dL (ref 0.3–1.2)
Total Protein: 6.3 g/dL — ABNORMAL LOW (ref 6.5–8.1)

## 2022-06-01 LAB — BASIC METABOLIC PANEL
Anion gap: 5 (ref 5–15)
BUN: 25 mg/dL — ABNORMAL HIGH (ref 8–23)
CO2: 24 mmol/L (ref 22–32)
Calcium: 8.6 mg/dL — ABNORMAL LOW (ref 8.9–10.3)
Chloride: 111 mmol/L (ref 98–111)
Creatinine, Ser: 1.25 mg/dL — ABNORMAL HIGH (ref 0.61–1.24)
GFR, Estimated: 59 mL/min — ABNORMAL LOW (ref >60)
Glucose, Bld: 149 mg/dL — ABNORMAL HIGH (ref 70–99)
Potassium: 4.5 mmol/L (ref 3.5–5.1)
Sodium: 140 mmol/L (ref 135–145)

## 2022-06-01 LAB — RETICULOCYTES
Immature Retic Fract: 28.8 % — ABNORMAL HIGH (ref 2.3–15.9)
RBC.: 2.35 MIL/uL — ABNORMAL LOW (ref 4.22–5.81)
Retic Count, Absolute: 99.2 10*3/uL (ref 19.0–186.0)
Retic Ct Pct: 4.2 % — ABNORMAL HIGH (ref 0.4–3.1)

## 2022-06-01 LAB — BRAIN NATRIURETIC PEPTIDE: B Natriuretic Peptide: 174.6 pg/mL — ABNORMAL HIGH (ref 0.0–100.0)

## 2022-06-01 LAB — PROTIME-INR
INR: 1.4 — ABNORMAL HIGH (ref 0.8–1.2)
Prothrombin Time: 16.8 seconds — ABNORMAL HIGH (ref 11.4–15.2)

## 2022-06-01 LAB — TROPONIN I (HIGH SENSITIVITY)
Troponin I (High Sensitivity): 6 ng/L (ref <18)
Troponin I (High Sensitivity): 6 ng/L (ref <18)

## 2022-06-01 LAB — APTT: aPTT: 34 seconds (ref 24–36)

## 2022-06-01 MED ORDER — FUROSEMIDE 10 MG/ML IJ SOLN
40.0000 mg | Freq: Once | INTRAMUSCULAR | Status: AC
  Administered 2022-06-01: 40 mg via INTRAVENOUS
  Filled 2022-06-01: qty 4

## 2022-06-01 MED ORDER — FUROSEMIDE 10 MG/ML IJ SOLN
40.0000 mg | Freq: Two times a day (BID) | INTRAMUSCULAR | Status: DC
  Administered 2022-06-02 – 2022-06-03 (×3): 40 mg via INTRAVENOUS
  Filled 2022-06-01 (×3): qty 4

## 2022-06-01 MED ORDER — ONDANSETRON HCL 4 MG PO TABS: 4.0000 mg | ORAL_TABLET | Freq: Four times a day (QID) | ORAL | Status: DC | PRN

## 2022-06-01 MED ORDER — ACETAMINOPHEN 325 MG PO TABS: 650.0000 mg | ORAL_TABLET | Freq: Four times a day (QID) | ORAL | Status: DC | PRN

## 2022-06-01 MED ORDER — IPRATROPIUM BROMIDE 0.02 % IN SOLN
0.5000 mg | Freq: Four times a day (QID) | RESPIRATORY_TRACT | Status: DC | PRN
Start: 2022-06-01 — End: 2022-06-04

## 2022-06-01 MED ORDER — ACETAMINOPHEN 650 MG RE SUPP: 650.0000 mg | Freq: Four times a day (QID) | RECTAL | Status: DC | PRN

## 2022-06-01 MED ORDER — PANTOPRAZOLE SODIUM 40 MG IV SOLR
40.0000 mg | Freq: Once | INTRAVENOUS | Status: AC
  Administered 2022-06-01: 40 mg via INTRAVENOUS
  Filled 2022-06-01: qty 10

## 2022-06-01 MED ORDER — ONDANSETRON HCL 4 MG/2ML IJ SOLN: 4.0000 mg | Freq: Four times a day (QID) | INTRAMUSCULAR | Status: DC | PRN

## 2022-06-01 MED ORDER — PANTOPRAZOLE SODIUM 40 MG IV SOLR
40.0000 mg | Freq: Two times a day (BID) | INTRAVENOUS | Status: DC
  Administered 2022-06-01 – 2022-06-04 (×6): 40 mg via INTRAVENOUS
  Filled 2022-06-01 (×6): qty 10

## 2022-06-01 NOTE — ED Provider Notes (Signed)
[facility] Provider Note    Event Date/Time   First MD Initiated Contact with Patient 06/01/22 1759     (approximate)   History   Shortness of Breath and Leg Swelling   HPI  Kevin Love is a 76 y.o. male with parkinson's, who is on oral chemotherapy for leukemia CML, Eliquis for A-fib who comes in with weight gain shortness of breath and leg swelling. He has had 5 lb gained weight over the past few weeks. He reports feeling like he cant catch his breath but oxygen 98%.  Denies any melena, blood loss, cough.  Physical Exam   Triage Vital Signs: ED Triage Vitals  Enc Vitals Group     BP 06/01/22 1623 (!) 123/46     Pulse Rate 06/01/22 1623 (!) 59     Resp 06/01/22 1623 18     Temp 06/01/22 1623 98.2 F (36.8 C)     Temp Source 06/01/22 1623 Oral     SpO2 06/01/22 1623 96 %     Weight 06/01/22 1624 170 lb (77.1 kg)     Height 06/01/22 1624 '5\' 4"'$  (1.626 m)     Head Circumference --      Peak Flow --      Pain Score 06/01/22 1623 0     Pain Loc --      Pain Edu? --      Excl. in [place]? --     Most recent vital signs: Vitals:   06/01/22 1623  BP: (!) 123/46  Pulse: (!) 59  Resp: 18  Temp: 98.2 F (36.8 C)  SpO2: 96%     General: Awake, no distress.  CV:  Good peripheral perfusion.  Resp:  Normal effort.  Abd:  No distention. Soft and non tender  Other:  Leg swelling noted bilaterally Rectal exam with brown stool but was Hemoccult positive  ED Results / Procedures / Treatments   Labs (all labs ordered are listed, but only abnormal results are displayed) Labs Reviewed  CBC WITH DIFFERENTIAL/PLATELET - Abnormal; Notable for the following components:      Result Value   RBC 2.35 (*)    Hemoglobin 7.3 (*)    HCT 23.8 (*)    MCV 101.3 (*)    All other components within normal limits  BRAIN NATRIURETIC PEPTIDE - Abnormal; Notable for the following components:   B Natriuretic Peptide 174.6 (*)    All other components within normal  limits  BASIC METABOLIC PANEL - Abnormal; Notable for the following components:   Glucose, Bld 149 (*)    BUN 25 (*)    Creatinine, Ser 1.25 (*)    Calcium 8.6 (*)    GFR, Estimated 59 (*)    All other components within normal limits  TROPONIN I (HIGH SENSITIVITY)  TROPONIN I (HIGH SENSITIVITY)     EKG  My interpretation of EKG:  Normal sinus rate of 61 without any ST elevation or T wave inversions, right bundle branch block--family was told that he has a history of right bundle branch block and his last cardiology visit  RADIOLOGY I have reviewed the xray personally and interpreted and patient has pleural effusions with some pulmonary vascular congestion noted  PROCEDURES:  Critical Care performed: No  .1-3 Lead EKG Interpretation  Performed by: Vanessa Hudson, MD Authorized by: Vanessa Oakley, MD     Interpretation: normal     ECG rate:  60   ECG rate assessment: normal  Rhythm: sinus rhythm     Ectopy: none     Conduction: normal      MEDICATIONS ORDERED IN ED: Medications  furosemide (LASIX) injection 40 mg (has no administration in time range)  pantoprazole (PROTONIX) injection 40 mg (has no administration in time range)     IMPRESSION / MDM / ASSESSMENT AND PLAN / ED COURSE  I reviewed the triage vital signs and the nursing notes.   Patient's presentation is most consistent with acute presentation with potential threat to life or bodily function.   Differential includes CHF, ACS.  Low suspicion for PE given on Eliquis and has been compliant.  No falls hitting his head.  No bruising on his back or pain on his back with palpation to suggest retroperitoneal bleed.  Consider anemia.  Troponin is negative.  CBC shows hemoglobin that is downtrended from 13 down to 7.3 BNP slightly elevated Creatinine is stable   Rectal exam was brown stool but Hemoccult positive.  We discussed blood transfusion but his threshold is currently above 7 and he has no stents put  in so they want to see what happens after some diuresis.  We will give a dose of 40 of IV Lasix.  Patient will need admission to the hospital team for work-up of anemia possible GI bleed and possible CHF  The patient is on the cardiac monitor to evaluate for evidence of arrhythmia and/or significant heart rate changes.      FINAL CLINICAL IMPRESSION(S) / ED DIAGNOSES   Final diagnoses:  Acute congestive heart failure, unspecified heart failure type (HCC)  Anemia, unspecified type  Gastrointestinal hemorrhage, unspecified gastrointestinal hemorrhage type     Rx / DC Orders   ED Discharge Orders     None        Note:  This document was prepared using Dragon voice recognition software and may include unintentional dictation errors.   Vanessa [place], MD 06/01/22 [phone]

## 2022-06-01 NOTE — ED Triage Notes (Signed)
Pt, sent by PCP, c/o increasing SOB and BLE swelling x 1 week.  Pt reports bloodwork and xray at PCP office.  Sts he "has fluid on his lungs and internal bleeding."  Pt takes an oral chemo daily.

## 2022-06-01 NOTE — H&P (Signed)
History and Physical    Kevin Love:[id number] DOB: 06-17-1946 DOA: 06/01/2022  PCP: Baxter Hire, MD  Patient coming from: [facility]  I have personally briefly reviewed patient's old medical records in [place]  Chief Complaint:   HPI: [name] is a 76 y.o. 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  Of note Spyrcel was restarted  one month later  s/p  echo noting normal ef. Patient of note had interim hx of hospitalization 02/12/22 at which time he was diagnosed with CVA as well as new onset afib and discharged on eliquis and amiodarone  Patient no presents to ED with sent in by pcp with increasing sob  and BLE  swelling x 1 week.  In addition on evaluation by pcp he was found to have low hgb .Patient notes he had significant doe as week went out which affected his ADLS. He noted no chest pain, n/v/diarrhea. He also denies black stools or blood in stools. He notes his stools are brown. He does note chronic back pain but states this is baseline.  ED Course:  ON evaluation in ED patient was found to have have fluid overload with cxr noting Pulmonary vascular congestion. Small right possible trace left pleural effusions. Patient was also noted to have anemia with hgb dropping to 7.3 from 10 - 3 weeks ago (05/11/22). Patient in ED also noted to have heme + stools.    Vitals: Afeb, bp 123/46,  hr 59, rr 18 , sat 96% on ra    Labs Wbc 6.8, hgb 7.3 down from 10 05/12/22 Plt 222, mcv 101 BNP 174 NA 140, K 4.5, cr 1.25( base 0.77) EKG: nsr ,low voltage, RBBB CE6 Inr 1.4 LTJ:[id number] vascular congestion    Tx Protonix 40 mg Lasix 40 mg   Review of Systems: As per HPI otherwise 10 point review of systems negative.   Past Medical History:  Diagnosis Date   Chronic Leukemia     Glaucoma    Parkinson disease ([place]) 07/25/2020    Past Surgical History:  Procedure Laterality Date   BACK SURGERY       reports that he has quit smoking. His smoking use included cigarettes. He has never used smokeless tobacco. He reports that he does not drink alcohol and does not use drugs.  Allergies  Allergen Reactions   Penicillins Rash    No family history on file.  Prior to Admission medications   Medication Sig Start Date End Date Taking? Authorizing Provider  amiodarone (PACERONE) 400 MG tablet Take 1 tablet (400 mg total) by mouth daily. 02/14/22   Sharen Hones, MD  apixaban (ELIQUIS) 5 MG TABS tablet Take 1 tablet (5 mg total) by mouth 2 (two) times daily. 02/13/22   Sharen Hones, MD  atorvastatin (LIPITOR) 80 MG tablet Take 1 tablet (80 mg total) by mouth daily. 02/14/22   Sharen Hones, MD  carbidopa-levodopa (SINEMET IR) 25-100 MG tablet Take 1 tablet by mouth 4 (four) times daily.    [provider]  Cholecalciferol 50 MCG (2000 UT) CAPS Take 2,000 Units by mouth daily. 07/22/20   [provider]  clonazePAM (KLONOPIN) 0.5 MG tablet Take 0.5 mg by mouth daily.    [provider]  dasatinib (SPRYCEL) 20 MG tablet Take 1 tablet (20 mg total) by mouth daily. 02/17/22  Derek Jack, MD  furosemide (LASIX) 20 MG tablet Take 1 tablet (20 mg total) by mouth daily as needed (take daily in the morning as needed for ankle swelling). 05/12/22   Derek Jack, MD  latanoprost (XALATAN) 0.005 % ophthalmic solution Place 1 drop into the right eye at bedtime. 02/12/20   [provider]  lisinopril (ZESTRIL) 10 MG tablet Take 10 mg by mouth daily. 02/12/20   [provider]  memantine (NAMENDA) 10 MG tablet     [provider]  Multiple Vitamins-Minerals (CENTRUM SILVER 50+MEN PO) Take 1 tablet by mouth daily.    [provider]  promethazine (PHENERGAN) 6.25 MG/5ML syrup Take by mouth.    [provider]   tamsulosin (FLOMAX) 0.4 MG CAPS capsule Take 0.4 mg by mouth daily.    [provider]    Physical Exam: Vitals:   06/01/22 1623 06/01/22 1624 06/01/22 1805 06/01/22 1900  BP: (!) 123/46  (!) 144/59 (!) 140/58  Pulse: (!) 59  61 (!) 58  Resp: 18  16 (!) 25  Temp: 98.2 F (36.8 C)     TempSrc: Oral     SpO2: 96%  98% 99%  Weight:  77.1 kg    Height:  '5\' 4"'$  (1.626 m)       Vitals:   06/01/22 1623 06/01/22 1624 06/01/22 1805 06/01/22 1900  BP: (!) 123/46  (!) 144/59 (!) 140/58  Pulse: (!) 59  61 (!) 58  Resp: 18  16 (!) 25  Temp: 98.2 F (36.8 C)     TempSrc: Oral     SpO2: 96%  98% 99%  Weight:  77.1 kg    Height:  '5\' 4"'$  (1.626 m)    Constitutional: NAD, calm, comfortable Eyes: left eye enucleated , right pupil reactive, lids and conjunctivae normal ENMT: Mucous membranes are moist. Posterior pharynx clear of any exudate or lesions.Normal dentition.  Neck: normal, supple, no masses, no thyromegaly Respiratory: clear to auscultation bilaterally,decrease at base R.>L no wheezing, no crackles. Normal respiratory effort. No accessory muscle use.  Cardiovascular: Regular rate and rhythm, no murmurs / rubs / gallops. + extremity edema. + pedal pulses.  Abdomen: no tenderness, no masses palpated. No hepatosplenomegaly. Bowel sounds positive.  Musculoskeletal: no clubbing / cyanosis. No joint deformity upper and lower extremities. Good ROM, no contractures. Normal muscle tone.  Skin: no rashes, lesions, ulcers. No induration Neurologic: CN 2-12 grossly intact. Sensation intact,  Strength 5/5 in all 4.  Psychiatric: Normal judgment and insight. Alert and oriented x 3. Normal mood.    Labs on Admission: I have personally reviewed following labs and imaging studies  CBC: Recent Labs  Lab 06/01/22 1636  WBC 6.8  NEUTROABS 4.5  HGB 7.3*  HCT 23.8*  MCV 101.3*  PLT 258   Basic Metabolic Panel: Recent Labs  Lab 06/01/22 1636  NA 140  K 4.5  CL 111  CO2 24   GLUCOSE 149*  BUN 25*  CREATININE 1.25*  CALCIUM 8.6*   GFR: Estimated Creatinine Clearance: 46.5 mL/min (A) (by C-G formula based on SCr of 1.25 mg/dL (H)). Liver Function Tests: No results for input(s): "AST", "ALT", "ALKPHOS", "BILITOT", "PROT", "ALBUMIN" in the last 168 hours. No results for input(s): "LIPASE", "AMYLASE" in the last 168 hours. No results for input(s): "AMMONIA" in the last 168 hours. Coagulation Profile: No results for input(s): "INR", "PROTIME" in the last 168 hours. Cardiac Enzymes: No results for input(s): "CKTOTAL", "CKMB", "CKMBINDEX", "TROPONINI" in the last 168  hours. BNP (last 3 results) No results for input(s): "PROBNP" in the last 8760 hours. HbA1C: No results for input(s): "HGBA1C" in the last 72 hours. CBG: No results for input(s): "GLUCAP" in the last 168 hours. Lipid Profile: No results for input(s): "CHOL", "HDL", "LDLCALC", "TRIG", "CHOLHDL", "LDLDIRECT" in the last 72 hours. Thyroid Function Tests: No results for input(s): "TSH", "T4TOTAL", "FREET4", "T3FREE", "THYROIDAB" in the last 72 hours. Anemia Panel: No results for input(s): "VITAMINB12", "FOLATE", "FERRITIN", "TIBC", "IRON", "RETICCTPCT" in the last 72 hours. Urine analysis: No results found for: "COLORURINE", "APPEARANCEUR", "LABSPEC", "PHURINE", "GLUCOSEU", "HGBUR", "BILIRUBINUR", "KETONESUR", "PROTEINUR", "UROBILINOGEN", "NITRITE", "LEUKOCYTESUR"  Radiological Exams on Admission: DG Chest 2 View  Result Date: 06/01/2022 CLINICAL DATA:  Dyspnea and leg swelling EXAM: CHEST - 2 VIEW COMPARISON:  None Available. FINDINGS: Mild cardiomegaly. Pulmonary vascular congestion. Small right possible trace left pleural effusions and associated basilar atelectasis. No pneumothorax. No acute osseous abnormality. Aortic atherosclerotic calcification. IMPRESSION: Cardiomegaly and pulmonary vascular congestion. Small right and trace left pleural effusions. Aortic Atherosclerosis (ICD10-I70.0).  Electronically Signed   By: Placido Sou M.D.   On: 06/01/2022 17:06    EKG: Independently reviewed. See above  Assessment/Plan Fluid overload  -Presumed New Onset CHF  -in setting of oral chemo therapy with known side-effect of fluid retention -admit to cardiac tele  -place on CHF protocol  - lasix 40 mg iv BID -monitor renal function closely with diuresis  -echo in am  -based on echo results consider cardiology consult   GI Bleed with Anemia of Blood loss  -in setting of Eliquis use as well as oral Chemo  - hold eliquis/oral chemo  -ppi/bid  -monitor h/h  -transfuse 1 unit  once pt more euvolemic or earlier base on repeat hgb  -current hgb 7 , however patient is hemodynamically stable  - Gi consult for further assistance   Atrial fibrillation -hold Eliquis  - continue amiodarone '200mg'$  po daily x1 more weeks  Per last cardiology noted on 05/18/22   CML  -hold oral chemo for now  -consider follow up with oncology in am for further direction in setting of bleeding   HLD -Continue atorvastatin    Parkinson's  -continue on sinemet  Anxiety -continue on home regimen    CVA Left MCA -followed by neurology  - hold secondary ppx due to acute bleeding     Glaucoma -resume home regimen once med rec has been completed    HTN -hold lisinopril due to mild AKI -resume as able   Gi ppx -ppi    Resume all medications as note above once med rec has been completed DVT prophylaxis: scd  Code Status: Full Family Communication:  Disposition Plan: patient  expected to be admitted greater than 2 midnights  Consults called: Gi,[place], Cardiology [place] Admission status: cardiac tele   Clance Boll MD [facility]   If 7PM-7AM, please contact night-coverage [email or web address] Password [facility]  06/01/2022, 7:09 PM

## 2022-06-01 NOTE — ED Provider Triage Note (Signed)
Emergency Medicine Provider Triage Evaluation Note  Kevin Love , a 76 y.o. male  was evaluated in triage.  Pt complains of SOB and leg swelling. Reports he was sent from PCP office. No chest pain. Wife reports 5 pound weight gain over the past couple weeks. No cough. On oral chemo for leukemia. Still taking eliquis for afib.  Patient Active Problem List   Diagnosis Date Noted   CVA (cerebral vascular accident) ([place]) 02/12/2022   New onset atrial fibrillation ([place]) 02/12/2022   CML (chronic myelocytic leukemia) ([place]) 08/05/2020   Leukocytosis 07/25/2020   Hypertension 07/25/2020   Parkinson disease ([place]) 07/25/2020   TIA (transient ischemic attack) 07/25/2020    Review of Systems  Positive: SOB, leg swelling Negative: CP  Physical Exam  There were no vitals taken for this visit. Gen:   Awake, no distress   Resp:  Normal effort  MSK:   Moves extremities without difficulty  Other:    Medical Decision Making  Medically screening exam initiated at 4:20 PM.  Appropriate orders placed.  Kevin Love was informed that the remainder of the evaluation will be completed by another provider, this initial triage assessment does not replace that evaluation, and the importance of remaining in the ED until their evaluation is complete.     Kevin Old, PA-C 06/01/22 1624

## 2022-06-02 ENCOUNTER — Inpatient Hospital Stay
Admit: 2022-06-02 | Discharge: 2022-06-02 | Disposition: A | Discharge status: Home / Self Care | Payer: Medicare Other | Attending: Internal Medicine | Admitting: Internal Medicine

## 2022-06-02 DIAGNOSIS — I509 Heart failure, unspecified: Secondary | ICD-10-CM | POA: Diagnosis not present

## 2022-06-02 DIAGNOSIS — G20 Parkinson's disease: Secondary | ICD-10-CM

## 2022-06-02 DIAGNOSIS — K922 Gastrointestinal hemorrhage, unspecified: Secondary | ICD-10-CM

## 2022-06-02 LAB — COMPREHENSIVE METABOLIC PANEL
ALT: 35 U/L (ref 0–44)
AST: 25 U/L (ref 15–41)
Albumin: 3.5 g/dL (ref 3.5–5.0)
Alkaline Phosphatase: 120 U/L (ref 38–126)
Anion gap: 4 — ABNORMAL LOW (ref 5–15)
BUN: 21 mg/dL (ref 8–23)
CO2: 26 mmol/L (ref 22–32)
Calcium: 8.6 mg/dL — ABNORMAL LOW (ref 8.9–10.3)
Chloride: 111 mmol/L (ref 98–111)
Creatinine, Ser: 1.2 mg/dL (ref 0.61–1.24)
GFR, Estimated: >60 mL/min (ref >60)
Glucose, Bld: 122 mg/dL — ABNORMAL HIGH (ref 70–99)
Potassium: 3.8 mmol/L (ref 3.5–5.1)
Sodium: 141 mmol/L (ref 135–145)
Total Bilirubin: 0.9 mg/dL (ref 0.3–1.2)
Total Protein: 6.6 g/dL (ref 6.5–8.1)

## 2022-06-02 LAB — CBC
HCT: 27.6 % — ABNORMAL LOW (ref 39.0–52.0)
Hemoglobin: 8.6 g/dL — ABNORMAL LOW (ref 13.0–17.0)
MCH: 30.2 pg (ref 26.0–34.0)
MCHC: 31.2 g/dL (ref 30.0–36.0)
MCV: 96.8 fL (ref 80.0–100.0)
Platelets: 205 10*3/uL (ref 150–400)
RBC: 2.85 MIL/uL — ABNORMAL LOW (ref 4.22–5.81)
RDW: 16.3 % — ABNORMAL HIGH (ref 11.5–15.5)
WBC: 6.1 10*3/uL (ref 4.0–10.5)
nRBC: 0 % (ref 0.0–0.2)

## 2022-06-02 LAB — ECHOCARDIOGRAM COMPLETE
AR max vel: 2.13 cm2
AV Area VTI: 2.1 cm2
AV Area mean vel: 1.99 cm2
AV Mean grad: 6 mmHg
AV Peak grad: 10.5 mmHg
Ao pk vel: 1.62 m/s
Area-P 1/2: 4.15 cm2
Height: 64 in
S' Lateral: 2.89 cm
Weight: 2720 oz

## 2022-06-02 LAB — IRON AND TIBC
Iron: 36 ug/dL — ABNORMAL LOW (ref 45–182)
Saturation Ratios: 16 % — ABNORMAL LOW (ref 17.9–39.5)
TIBC: 227 ug/dL — ABNORMAL LOW (ref 250–450)
UIBC: 191 ug/dL

## 2022-06-02 LAB — HEMOGLOBIN A1C
Hgb A1c MFr Bld: 4.6 % — ABNORMAL LOW (ref 4.8–5.6)
Mean Plasma Glucose: 85.32 mg/dL

## 2022-06-02 LAB — VITAMIN B12: Vitamin B-12: 837 pg/mL (ref 180–914)

## 2022-06-02 LAB — TSH: TSH: 5.32 u[IU]/mL — ABNORMAL HIGH (ref 0.350–4.500)

## 2022-06-02 LAB — FERRITIN: Ferritin: 61 ng/mL (ref 24–336)

## 2022-06-02 LAB — CBG MONITORING, ED: Glucose-Capillary: 125 mg/dL — ABNORMAL HIGH (ref 70–99)

## 2022-06-02 LAB — HEMOGLOBIN AND HEMATOCRIT, BLOOD
HCT: 26.8 % — ABNORMAL LOW (ref 39.0–52.0)
Hemoglobin: 8.4 g/dL — ABNORMAL LOW (ref 13.0–17.0)

## 2022-06-02 LAB — FOLATE: Folate: 27 ng/mL (ref >5.9)

## 2022-06-02 LAB — HEMOGLOBIN: Hemoglobin: 6.7 g/dL — ABNORMAL LOW (ref 13.0–17.0)

## 2022-06-02 LAB — ABO/RH: ABO/RH(D): A POS

## 2022-06-02 LAB — PREPARE RBC (CROSSMATCH)

## 2022-06-02 MED ORDER — AMIODARONE HCL 200 MG PO TABS
200.0000 mg | ORAL_TABLET | Freq: Every day | ORAL | Status: DC
  Administered 2022-06-03 – 2022-06-04 (×2): 200 mg via ORAL
  Filled 2022-06-02 (×2): qty 1

## 2022-06-02 MED ORDER — LATANOPROST 0.005 % OP SOLN
1.0000 [drp] | Freq: Every day | OPHTHALMIC | Status: DC
  Administered 2022-06-02 – 2022-06-03 (×2): 1 [drp] via OPHTHALMIC
  Filled 2022-06-02: qty 2.5

## 2022-06-02 MED ORDER — PEG 3350-KCL-NA BICARB-NACL 420 G PO SOLR
4000.0000 mL | Freq: Once | ORAL | Status: AC
  Administered 2022-06-02: 4000 mL via ORAL
  Filled 2022-06-02: qty 4000

## 2022-06-02 MED ORDER — SODIUM CHLORIDE 0.9% IV SOLUTION: Freq: Once | INTRAVENOUS | Status: AC

## 2022-06-02 MED ORDER — ATORVASTATIN CALCIUM 80 MG PO TABS
80.0000 mg | ORAL_TABLET | Freq: Every day | ORAL | Status: DC
  Administered 2022-06-03 – 2022-06-04 (×2): 80 mg via ORAL
  Filled 2022-06-02 (×2): qty 1

## 2022-06-02 NOTE — ED Notes (Signed)
Transport requested to take patient to floor bed

## 2022-06-02 NOTE — Consult Note (Signed)
   Heart Failure Nurse Navigator Note  HFpEF 60 to 65%.  Normal diastolic left ventricular parameters.  Normal right ventricular systolic function mild aortic insufficiency.  Mild to moderate tricuspid regurgitation.  Mild to moderate mitral valve regurgitation.  He presented from PCPs office for increasing shortness of breath and lower extremity edema for 1 week.  He was also noted to have a decline in his hemoglobin.  Comorbidities:  Parkinson's Atrial fibrillation on Eliquis Anxiety Hyperlipidemia CVA Hypertension CML  Medications:  Amiodarone 200 mg daily Atorvastatin 80 mg daily Furosemide 40 mg IV every 12  Labs:  Sodium 141, potassium 3.8, chloride 111, CO2 26, BUN 21, creatinine 1.20, hemoglobin 6.8 hematocrit 27.6 Weight is 59.3 Intake not documented Output 2000 mL  Initial meeting with patient and his wife who was at the bedside.  He states that he is hungry and waiting to have a colonoscopy.  Discussed what heart failure means.  He states that he does not weigh himself daily, talked about when he eats on a daily basis and recording to report 2 to 3 pound weight gain overnight or 5 pounds within the week also to report any changes in symptoms such as increasing lower extremity edema, shortness of breath, PND and orthopnea etc.  Made aware of appointment with the outpatient heart failure clinic on September 7 at 8:30 in the morning. They are also given the living with heart failure teaching booklet is on magnet and info on heart failure and sodium content.  They had no further questions, we will continue to follow.  Pricilla Riffle RN [facility]

## 2022-06-02 NOTE — Progress Notes (Addendum)
PROGRESS NOTE   HPI was taken from Dr. Marcello Moores: Kevin Love is a 76 y.o. 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  Of note Spyrcel was restarted  one month later  s/p  echo noting normal ef. Patient of note had interim hx of hospitalization 02/12/22 at which time he was diagnosed with CVA as well as new onset afib and discharged on eliquis and amiodarone  Patient no presents to ED with sent in by pcp with increasing sob  and BLE  swelling x 1 week.  In addition on evaluation by pcp he was found to have low hgb .Patient notes he had significant doe as week went out which affected his ADLS. He noted no chest pain, n/v/diarrhea. He also denies black stools or blood in stools. He notes his stools are brown. He does note chronic back pain but states this is baseline.   ED Course:  ON evaluation in ED patient was found to have have fluid overload with cxr noting Pulmonary vascular congestion. Small right possible trace left pleural effusions. Patient was also noted to have anemia with hgb dropping to 7.3 from 10 - 3 weeks ago (05/11/22). Patient in ED also noted to have heme + stools.     Kevin Love  DDU:[id number] DOB: 1946-04-20 DOA: 06/01/2022 PCP: Baxter Hire, MD   Assessment & Plan:   Principal Problem:   CHF (congestive heart failure) ([place]) Active Problems:   Fluid overload  Assessment and Plan: Fluid overload : possibly secondary to new onset CHF. Unknown systolic vs diastolic vs combined. Echo pending. Continue on IV lasix. Monitor I/Os. Cardio consulted   GI Bleed: s/p pRBCs transfused. H&H is trending up. Hold eliquis. Will go for EGD/colonoscopy tomorrow as per GI.  Continue on PPI    Likely PAF: continue on home dose of amiodarone. Holding eliquis    CML: holding oral chemo for now.     HLD: continue on statin    Parkinson's: continue on home dose of sinemet   Anxiety: severity unknown. Continue on home dose of klonopin    Hx of CVA: holding home dose of eliquis   Glaucoma: continue on home dose of latanoprost eye drops    HTN: hold home dose of lisinopril secondary to AKI            DVT prophylaxis: SCDs Code Status: full  Family Communication: discussed pt's care w/ pt's family at bedside and answered their questions  Disposition Plan: likely d/c home w/ home health   Level of care: Telemetry Cardiac  Status is: Inpatient Remains inpatient appropriate because: EGD/colonoscopy tomorrow     Consultants:  GI Cardio   Procedures:  Antimicrobials:   Subjective: Pt c/o fatigue   Objective: Vitals:   06/02/22 0421 06/02/22 0700 06/02/22 0748 06/02/22 0932  BP: (!) 146/55 (!) 152/65 (!) 150/63 (!) 141/61  Pulse: 60 64 63 62  Resp: '19 20 19 17  '$ Temp: 98.5 F (36.9 C) 98.7 F (37.1 C)  97.7 F (36.5 C)  TempSrc: Oral Oral  Oral  SpO2: 94% 95% 94% 96%  Weight:      Height:        Intake/Output Summary (Last 24 hours) at 06/02/2022 1528 Last data filed at 06/02/2022 0700 Gross per 24 hour  Intake --  Output 1275 ml  Net -1275  ml   Filed Weights   06/01/22 1624  Weight: 77.1 kg    Examination:  General exam: Appears calm and comfortable  Respiratory system: diminished breath sounds  Cardiovascular system: S1 & S2 +. No rubs, gallops or clicks.  Gastrointestinal system: Abdomen is nondistended, soft and nontender. Normal bowel sounds heard. Central nervous system: Alert and oriented. Moves all extremities  Psychiatry: Judgement and insight appear normal. Flat mood and affect     Data Reviewed: I have personally reviewed following labs and imaging studies  CBC: Recent Labs  Lab 06/01/22 1636 06/02/22 0107 06/02/22 0827  WBC 6.8  --  6.1  NEUTROABS 4.5  --   --   HGB 7.3* 6.7* 8.6*  HCT 23.8*  --  27.6*  MCV 101.3*  --   96.8  PLT 222  --  301   Basic Metabolic Panel: Recent Labs  Lab 06/01/22 1636 06/02/22 0827  NA 140 141  K 4.5 3.8  CL 111 111  CO2 24 26  GLUCOSE 149* 122*  BUN 25* 21  CREATININE 1.25* 1.20  CALCIUM 8.6* 8.6*   GFR: Estimated Creatinine Clearance: 48.4 mL/min (by C-G formula based on SCr of 1.2 mg/dL). Liver Function Tests: Recent Labs  Lab 06/01/22 1856 06/02/22 0827  AST 23 25  ALT 30 35  ALKPHOS 108 120  BILITOT 0.5 0.9  PROT 6.3* 6.6  ALBUMIN 3.4* 3.5   No results for input(s): "LIPASE", "AMYLASE" in the last 168 hours. No results for input(s): "AMMONIA" in the last 168 hours. Coagulation Profile: Recent Labs  Lab 06/01/22 1856  INR 1.4*   Cardiac Enzymes: No results for input(s): "CKTOTAL", "CKMB", "CKMBINDEX", "TROPONINI" in the last 168 hours. BNP (last 3 results) No results for input(s): "PROBNP" in the last 8760 hours. HbA1C: Recent Labs    06/01/22 1636  HGBA1C 4.6*   CBG: Recent Labs  Lab 06/02/22 0850  GLUCAP 125*   Lipid Profile: No results for input(s): "CHOL", "HDL", "LDLCALC", "TRIG", "CHOLHDL", "LDLDIRECT" in the last 72 hours. Thyroid Function Tests: Recent Labs    06/01/22 1845  TSH 5.320*   Anemia Panel: Recent Labs    06/01/22 1636 06/01/22 1845 06/02/22 0107  VITAMINB12  --   --  837  FOLATE  --  27.0  --   FERRITIN  --  61  --   TIBC  --  227*  --   IRON  --  36*  --   RETICCTPCT 4.2*  --   --    Sepsis Labs: No results for input(s): "PROCALCITON", "LATICACIDVEN" in the last 168 hours.  No results found for this or any previous visit (from the past 240 hour(s)).       Radiology Studies: ECHOCARDIOGRAM COMPLETE  Result Date: 06/02/2022    ECHOCARDIOGRAM REPORT   Patient Name:   Kevin Love Date of Exam: 06/02/2022 Medical Rec #:  [id number]    Height:       64.0 in Accession #:    [id number]   Weight:       170.0 lb Date of Birth:  1946-05-18    BSA:          1.826 m Patient Age:    81 years     BP:            152/65 mmHg Patient Gender: M            HR:           62 bpm. Exam Location:  [facility] Procedure: 2D Echo, Cardiac Doppler and Color Doppler Indications:     I50.31 CHF Acute Diastolic  History:         Patient has no prior history of Echocardiogram examinations.                  CHF, CML, Arrythmias:Atrial Fibrillation; Risk Factors:Former                  Smoker and Hypertension.  Sonographer:     Rosalia Hammers Referring Phys:  [id number] SARA-MAIZ A THOMAS Diagnosing Phys: Isaias Cowman MD  Sonographer Comments: Suboptimal parasternal window. Image acquisition challenging due to respiratory motion. IMPRESSIONS  1. Left ventricular ejection fraction, by estimation, is 60 to 65%. The left ventricle has normal function. The left ventricle has no regional wall motion abnormalities. Left ventricular diastolic parameters were normal.  2. Right ventricular systolic function is normal. The right ventricular size is normal.  3. The mitral valve is normal in structure. Mild to moderate mitral valve regurgitation. No evidence of mitral stenosis.  4. Tricuspid valve regurgitation is mild to moderate.  5. The aortic valve is normal in structure. Aortic valve regurgitation is mild. No aortic stenosis is present.  6. The inferior vena cava is normal in size with greater than 50% respiratory variability, suggesting right atrial pressure of 3 mmHg. FINDINGS  Left Ventricle: Left ventricular ejection fraction, by estimation, is 60 to 65%. The left ventricle has normal function. The left ventricle has no regional wall motion abnormalities. The left ventricular internal cavity size was normal in size. There is  no left ventricular hypertrophy. Left ventricular diastolic parameters were normal. Right Ventricle: The right ventricular size is normal. No increase in right ventricular wall thickness. Right ventricular systolic function is normal. Left Atrium: Left atrial size was normal in size. Right Atrium: Right atrial size was  normal in size. Pericardium: There is no evidence of pericardial effusion. Mitral Valve: The mitral valve is normal in structure. Mild to moderate mitral valve regurgitation. No evidence of mitral valve stenosis. Tricuspid Valve: The tricuspid valve is normal in structure. Tricuspid valve regurgitation is mild to moderate. No evidence of tricuspid stenosis. Aortic Valve: The aortic valve is normal in structure. Aortic valve regurgitation is mild. No aortic stenosis is present. Aortic valve mean gradient measures 6.0 mmHg. Aortic valve peak gradient measures 10.5 mmHg. Aortic valve area, by VTI measures 2.10  cm. Pulmonic Valve: The pulmonic valve was normal in structure. Pulmonic valve regurgitation is not visualized. No evidence of pulmonic stenosis. Aorta: The aortic root is normal in size and structure. Venous: The inferior vena cava is normal in size with greater than 50% respiratory variability, suggesting right atrial pressure of 3 mmHg. IAS/Shunts: No atrial level shunt detected by color flow Doppler.  LEFT VENTRICLE PLAX 2D LVIDd:         4.94 cm   Diastology LVIDs:         2.89 cm   LV e' medial:    6.74 cm/s LV PW:         1.37 cm   LV E/e' medial:  19.3 LV IVS:        1.22 cm   LV e' lateral:   7.40 cm/s LVOT diam:     2.00 cm   LV E/e' lateral: 17.6 LV SV:         87 LV SV Index:   47 LVOT Area:     3.14 cm  RIGHT VENTRICLE RV Basal  diam:  2.94 cm RV Mid diam:    4.13 cm RV S prime:     14.50 cm/s TAPSE (M-mode): 3.4 cm LEFT ATRIUM             Index        RIGHT ATRIUM           Index LA diam:        4.40 cm 2.41 cm/m   RA Area:     19.50 cm LA Vol (A2C):   89.9 ml 49.24 ml/m  RA Volume:   53.10 ml  29.08 ml/m LA Vol (A4C):   79.1 ml 43.32 ml/m LA Biplane Vol: 84.9 ml 46.50 ml/m  AORTIC VALVE AV Area (Vmax):    2.13 cm AV Area (Vmean):   1.99 cm AV Area (VTI):     2.10 cm AV Vmax:           162.00 cm/s AV Vmean:          114.000 cm/s AV VTI:            0.413 m AV Peak Grad:      10.5 mmHg AV  Mean Grad:      6.0 mmHg LVOT Vmax:         110.00 cm/s LVOT Vmean:        72.300 cm/s LVOT VTI:          0.276 m LVOT/AV VTI ratio: 0.67  AORTA Ao Root diam: 3.60 cm MITRAL VALVE                TRICUSPID VALVE MV Area (PHT): 4.15 cm     TR Peak grad:   39.9 mmHg MV Decel Time: 183 msec     TR Vmax:        316.00 cm/s MV E velocity: 130.00 cm/s MV A velocity: 101.00 cm/s  SHUNTS MV E/A ratio:  1.29         Systemic VTI:  0.28 m                             Systemic Diam: 2.00 cm Isaias Cowman MD Electronically signed by Isaias Cowman MD Signature Date/Time: 06/02/2022/10:37:57 AM    Final    DG Chest 2 View  Result Date: 06/01/2022 CLINICAL DATA:  Dyspnea and leg swelling EXAM: CHEST - 2 VIEW COMPARISON:  None Available. FINDINGS: Mild cardiomegaly. Pulmonary vascular congestion. Small right possible trace left pleural effusions and associated basilar atelectasis. No pneumothorax. No acute osseous abnormality. Aortic atherosclerotic calcification. IMPRESSION: Cardiomegaly and pulmonary vascular congestion. Small right and trace left pleural effusions. Aortic Atherosclerosis (ICD10-I70.0). Electronically Signed   By: Placido Sou M.D.   On: 06/01/2022 17:06        Scheduled Meds:  sodium chloride   Intravenous Once   amiodarone  200 mg Oral Daily   atorvastatin  80 mg Oral Daily   furosemide  40 mg Intravenous Q12H   latanoprost  1 drop Right Eye QHS   pantoprazole (PROTONIX) IV  40 mg Intravenous Q12H   polyethylene glycol-electrolytes  4,000 mL Oral Once   Continuous Infusions:   LOS: 1 day    Time spent: 35 mins    Wyvonnia Dusky, MD [facility] Pager [phone]  If 7PM-7AM, please contact night-coverage [email or web address] 06/02/2022, 3:28 PM

## 2022-06-02 NOTE — ED Notes (Signed)
Provider notified about HGB 6.7. Decrease from 7.3 earlier in day

## 2022-06-02 NOTE — TOC Progression Note (Signed)
Transition of Care [facility]) - Progression Note    Patient Details  Name: Kevin Love MRN: [id number] Date of Birth: Jun 03, 1946  Transition of Care [facility]) CM/SW [place], RN Phone Number: 06/02/2022, 2:48 PM  Clinical Narrative:     The patient was recently open with [facility] for Home health and they are agreeable to reopen the patient for [facility] RN and PT       Expected Discharge Plan and Services                                                 Social Determinants of Health (SDOH) Interventions    Readmission Risk Interventions     No data to display

## 2022-06-02 NOTE — Consult Note (Signed)
Consultation  Referring Provider:     Dr Marcello Moores Admit date 06/01/22 Consult date        06/02/22 Reason for Consultation:   acute anemia           HPI:   Kevin Love is a 76 y.o. 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 Presented to ED yesterday for signigicant DOE and without any GI symptoms. . Hgb had dropped to 7.3, was found to be heme + on ED exam. He received some prbcs and hgb today is 8.6 B12/ferritin/folate normal. Iron low. Hemoglobin was normal 4/23. Oncologist is Dr Delton Coombes at [facility] in [place]. Last seen 05/12/22- noted at that visit he was still taking the sprycel, was also having problems with anemia- hgb 9.5-10. He has been on sprycel daily for the last 28mPatient's wife is with him. They report he had a very black stool about 2w ago and has had some intermittent solid food dysphagia- they endorse some Aleve use for back pain but have a disagreement as the the frequency. They deny abdominal pain and all other GI concerns. He states a history of colonoscopy every 5y for colon polyp history. Denies any family history of colorectal cancer/colon polyps. I am unsure if he has had egd in past. Last eliquis yesterday. Last sprycel 2d ago. States his breathing is better today.  PREVIOUS ENDOSCOPIES:            Colonsocpy - last 2019 by patient report, polyps removed. [place].   Past Medical History:  Diagnosis Date   Chronic Leukemia    Glaucoma    Parkinson disease ([place]    Past Surgical History:  Procedure Laterality Date   BACK SURGERY      No family history on file.   Social History   Tobacco Use   Smoking status: Former     Types: Cigarettes   Smokeless tobacco: Never  Substance Use Topics   Alcohol use: Never   Drug use: Never    Prior to Admission medications   Medication Sig Start Date End Date Taking? Authorizing Provider  acetaminophen (TYLENOL) 500 MG tablet Take 1,000 mg by mouth every 6 (six) hours as needed for moderate pain.   Yes [provider]  amiodarone (PACERONE) 400 MG tablet Take 1 tablet (400 mg total) by mouth daily. Patient taking differently: Take 200 mg by mouth daily. 02/14/22  Yes ZSharen Hones MD  apixaban (ELIQUIS) 5 MG TABS tablet Take 1 tablet (5 mg total) by mouth 2 (two) times daily. 02/13/22  Yes ZSharen Hones MD  atorvastatin (LIPITOR) 80 MG tablet Take 1 tablet (80 mg total) by mouth daily. 02/14/22  Yes ZSharen Hones MD  carbidopa-levodopa (SINEMET IR) 25-100 MG tablet Take 1 tablet by mouth 2 (two) times daily.   Yes [provider]  clonazePAM (KLONOPIN) 0.5 MG tablet Take 0.5 mg by mouth daily.   Yes [provider]  cyanocobalamin 1000 MCG tablet Take 1,000 mcg by mouth daily.   Yes [provider]  dasatinib (SPRYCEL) 20 MG tablet Take 1 tablet (20 mg total) by mouth daily. 02/17/22  Yes KDerek Jack MD  furosemide (LASIX) 20 MG tablet Take  1 tablet (20 mg total) by mouth daily as needed (take daily in the morning as needed for ankle swelling). 05/12/22  Yes Derek Jack, MD  latanoprost (XALATAN) 0.005 % ophthalmic solution Place 1 drop into the right eye at bedtime. 02/12/20  Yes [provider]  lisinopril (ZESTRIL) 10 MG tablet Take 10 mg by mouth daily. 02/12/20  Yes [provider]  memantine (NAMENDA) 10 MG tablet Take 10 mg by mouth 2 (two) times daily.   Yes [provider]  Multiple Vitamins-Minerals (CENTRUM SILVER 50+MEN PO) Take 1 tablet by mouth daily.   Yes [provider]  tamsulosin (FLOMAX) 0.4 MG CAPS capsule Take 0.4 mg by mouth daily.   Yes [provider]   Cholecalciferol 50 MCG (2000 UT) CAPS Take 2,000 Units by mouth daily. Patient not taking: Reported on 06/01/2022 07/22/20   [provider]  promethazine (PHENERGAN) 6.25 MG/5ML syrup Take by mouth. Patient not taking: Reported on 06/01/2022    [provider]    Current Facility-Administered Medications  Medication Dose Route Frequency Provider Last Rate Last Admin   0.9 %  sodium chloride infusion (Manually program via Guardrails IV Fluids)   Intravenous Once Foust, Katy L, NP       acetaminophen (TYLENOL) tablet 650 mg  650 mg Oral Q6H PRN Clance Boll, MD       Or   acetaminophen (TYLENOL) suppository 650 mg  650 mg Rectal Q6H PRN Clance Boll, MD       amiodarone (PACERONE) tablet 200 mg  200 mg Oral Daily Eppie Gibson M, MD       atorvastatin (LIPITOR) tablet 80 mg  80 mg Oral Daily Wyvonnia Dusky, MD       furosemide (LASIX) injection 40 mg  40 mg Intravenous Q12H Myles Rosenthal A, MD   40 mg at 06/02/22 0717   ipratropium (ATROVENT) nebulizer solution 0.5 mg  0.5 mg Nebulization Q6H PRN Clance Boll, MD       latanoprost (XALATAN) 0.005 % ophthalmic solution 1 drop  1 drop Right Eye QHS Wyvonnia Dusky, MD       ondansetron [facility]) tablet 4 mg  4 mg Oral Q6H PRN Clance Boll, MD       Or   ondansetron [facility]) injection 4 mg  4 mg Intravenous Q6H PRN Clance Boll, MD       pantoprazole (PROTONIX) injection 40 mg  40 mg Intravenous Q12H Myles Rosenthal A, MD   40 mg at 06/01/22 2233   Current Outpatient Medications  Medication Sig Dispense Refill   acetaminophen (TYLENOL) 500 MG tablet Take 1,000 mg by mouth every 6 (six) hours as needed for moderate pain.     amiodarone (PACERONE) 400 MG tablet Take 1 tablet (400 mg total) by mouth daily. (Patient taking differently: Take 200 mg by mouth daily.) 60 tablet 0   apixaban (ELIQUIS) 5 MG TABS tablet Take 1 tablet (5 mg total) by mouth 2 (two) times daily. 60 tablet  0   atorvastatin (LIPITOR) 80 MG tablet Take 1 tablet (80 mg total) by mouth daily. 30 tablet 0   carbidopa-levodopa (SINEMET IR) 25-100 MG tablet Take 1 tablet by mouth 2 (two) times daily.     clonazePAM (KLONOPIN) 0.5 MG tablet Take 0.5 mg by mouth daily.     cyanocobalamin 1000 MCG tablet Take 1,000 mcg by mouth daily.     dasatinib (SPRYCEL) 20 MG tablet Take 1 tablet (20 mg  total) by mouth daily. 30 tablet 11   furosemide (LASIX) 20 MG tablet Take 1 tablet (20 mg total) by mouth daily as needed (take daily in the morning as needed for ankle swelling). 30 tablet 1   latanoprost (XALATAN) 0.005 % ophthalmic solution Place 1 drop into the right eye at bedtime.     lisinopril (ZESTRIL) 10 MG tablet Take 10 mg by mouth daily.     memantine (NAMENDA) 10 MG tablet Take 10 mg by mouth 2 (two) times daily.     Multiple Vitamins-Minerals (CENTRUM SILVER 50+MEN PO) Take 1 tablet by mouth daily.     tamsulosin (FLOMAX) 0.4 MG CAPS capsule Take 0.4 mg by mouth daily.     Cholecalciferol 50 MCG (2000 UT) CAPS Take 2,000 Units by mouth daily. (Patient not taking: Reported on 06/01/2022)     promethazine (PHENERGAN) 6.25 MG/5ML syrup Take by mouth. (Patient not taking: Reported on 06/01/2022)      Allergies as of 06/01/2022 - Review Complete 06/01/2022  Allergen Reaction Noted   Penicillins Rash 02/02/2013     Review of Systems:    All systems reviewed and negative except where noted in HPI, with exception of altered gait and mild tremors    Physical Exam:  Vital signs in last 24 hours: Temp:  [97.9 F (36.6 C)-98.7 F (37.1 C)] 98.7 F (37.1 C) (08/15 0700) Pulse Rate:  [55-66] 63 (08/15 0748) Resp:  [14-25] 19 (08/15 0748) BP: (123-159)/(46-66) 150/63 (08/15 0748) SpO2:  [93 %-100 %] 94 % (08/15 0748) Weight:  [77.1 kg] 77.1 kg (08/14 1624)   General:   Pleasant man in NAD Head:  Normocephalic and atraumatic. Eyes:   No icterus.   Conjunctiva pink. Ears:  Normal auditory  acuity. Mouth: Mucosa pink moist, no lesions. Neck:  Supple; no masses felt Lungs:  Respirations even and unlabored. Lungs clear to auscultation bilaterally.   No wheezes, crackles, or rhonchi.  Heart:  S1S2, RRR, no MRG. No edema. Abdomen:   Flat, soft, nondistended, nontender. Normal bowel sounds. No appreciable masses or hepatomegaly. No rebound signs or other peritoneal signs. Rectal:  Not performed.  Msk:  MAEW x4, No clubbing or cyanosis. Strength 5/5. Symmetrical without gross deformities. Neurologic:  Alert and  oriented x4;  Cranial nerves II-XII intact.  Skin:  Warm, dry, pink without significant lesions or rashes. Psych:  Alert and cooperative. Normal affect.  LAB RESULTS: Recent Labs    06/01/22 1636 06/02/22 0107 06/02/22 0827  WBC 6.8  --  6.1  HGB 7.3* 6.7* 8.6*  HCT 23.8*  --  27.6*  PLT 222  --  205   BMET Recent Labs    06/01/22 1636  NA 140  K 4.5  CL 111  CO2 24  GLUCOSE 149*  BUN 25*  CREATININE 1.25*  CALCIUM 8.6*   LFT Recent Labs    06/01/22 1856  PROT 6.3*  ALBUMIN 3.4*  AST 23  ALT 30  ALKPHOS 108  BILITOT 0.5  BILIDIR <0.1  IBILI NOT CALCULATED   PT/INR Recent Labs    06/01/22 1856  LABPROT 16.8*  INR 1.4*    STUDIES: DG Chest 2 View  Result Date: 06/01/2022 CLINICAL DATA:  Dyspnea and leg swelling EXAM: CHEST - 2 VIEW COMPARISON:  None Available. FINDINGS: Mild cardiomegaly. Pulmonary vascular congestion. Small right possible trace left pleural effusions and associated basilar atelectasis. No pneumothorax. No acute osseous abnormality. Aortic atherosclerotic calcification. IMPRESSION: Cardiomegaly and pulmonary vascular congestion. Small right and trace  left pleural effusions. Aortic Atherosclerosis (ICD10-I70.0). Electronically Signed   By: Placido Sou M.D.   On: 06/01/2022 17:06       Impression / Plan:   Acute anemia exacerbation/IDA. History of colon polyps, suspected melena and intermittent dysphagia. Recommending  egd/colonoscopy for luminal evaluation- last dose of eliquis yesterday- will discuss timing with Dr Haig Prophet. Recommended to avoid NSAIDs with rationale.  Thank you very much for this consult. These services were provided by Stephens November, NP-C, in collaboration with Lesly Rubenstein, MD, with whom I have discussed this patient in full.   Stephens November, NP-C

## 2022-06-02 NOTE — Consult Note (Signed)
[place] NOTE       Patient ID: Kevin Love MRN: [id number] DOB/AGE: 1945-12-02 76 y.o.  Admit date: 06/01/2022 Referring Physician Dr. Myles Rosenthal Primary Physician Dr. Harrel Lemon Primary Cardiologist Dr. 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  Cardiology is consulted for further assistance.  The patient presents with his wife who contributes to the history.  He has had 2 weeks of worsening dyspnea on exertion, becoming out of breath walking short distances like from his house to his mailbox which is unusual for him.  He has had swelling in both of his lower legs worsening over the same period of time, but denies orthopnea, chest pain, PND, dizziness, palpitations.  His wife notes 2 years ago when he was in an increased dose of Sprycel chemotherapy he was told he had a heart failure exacerbation, was having PND and orthopnea at that time with some leg swelling.  He was prescribed 20 mg of p.o. Lasix by his oncologist within the past month or so for worsening peripheral edema, which she has only taken 3-4 times since it was prescribed.  He also notes 2 episodes of dark stools over the past 2 weeks, one he notes was a dark black color in the other dark brown.  Denies overt hematochezia.  He has been given 2 doses of IV Lasix 40 mg with brisk diuresis noting 1.2 L of urine output thus far and clinical improvement in his breathing.  He was also started on IV Protonix for GI bleed, formal GI consult pending.  Recent vitals are notable for blood pressure of 150/63, SPO2 94% on  room air, heart rate 63 in sinus rhythm on telemetry.  Labs are notable for potassium 4.5, BUN/creatinine 25/1.25, GFR 59.  BNP mildly elevated at 174.  High-sensitivity troponin negative at 6-6.  Iron panel with low serum iron 36, TIBC low at 227, iron saturation ratios low at 16.  Normal ferritin 61.  H&H downtrending, down from 3 points from 7/25 where it was 10.0 down to 7.3 on 8/14 and 6.7 this morning.  TSH slightly elevated at 5.3.  Chest x-ray showing slight cardiomegaly and pulmonary vascular congestion with a small right and trace left pleural effusion.  Review of systems complete and found to be negative unless listed above    Past Medical History:  Diagnosis Date   Chronic Leukemia    Glaucoma    Parkinson disease ([place]) 07/25/2020    Past Surgical History:  Procedure Laterality Date   BACK SURGERY      (Not in a hospital admission)  Social History   Socioeconomic History   Marital status: Married    Spouse name: Not on file   Number of children: Not on file   Years of education: Not on file   Highest education level: Not on file  Occupational History   Not on file  Tobacco Use   Smoking status: Former    Types: Cigarettes   Smokeless tobacco: Never  Substance and Sexual Activity   Alcohol use: Never   Drug use: Never   Sexual activity: Not Currently  Other Topics Concern  Not on file  Social History Narrative   Not on file   Social Determinants of Health   Financial Resource Strain: Low Risk  (08/22/2020)   Overall Financial Resource Strain (CARDIA)    Difficulty of Paying Living Expenses: Not hard at all  Food Insecurity: No Food Insecurity (08/22/2020)   Hunger Vital Sign    Worried About Running Out of Food in the Last Year: Never true    Ran Out of Food in the Last Year: Never true  Transportation Needs: No Transportation Needs (08/22/2020)   PRAPARE - Hydrologist (Medical): No    Lack of Transportation (Non-Medical): No   Physical Activity: Insufficiently Active (08/22/2020)   Exercise Vital Sign    Days of Exercise per Week: 7 days    Minutes of Exercise per Session: 10 min  Stress: No Stress Concern Present (08/22/2020)   [place]    Feeling of Stress : Not at all  Social Connections: Moderately Integrated (08/22/2020)   Social Connection and Isolation Panel [NHANES]    Frequency of Communication with Friends and Family: Three times a week    Frequency of Social Gatherings with Friends and Family: Once a week    Attends Religious Services: More than 4 times per year    Active Member of [employer] or Organizations: No    Attends Archivist Meetings: Never    Marital Status: Married  Human resources officer Violence: Not At Risk (08/22/2020)   Humiliation, Afraid, Rape, and Kick questionnaire    Fear of Current or Ex-Partner: No    Emotionally Abused: No    Physically Abused: No    Sexually Abused: No    No family history on file.    PHYSICAL EXAM General: Pleasant elderly Caucasian male, well nourished, in no acute distress.  Laying nearly flat in ED stretcher with his wife at bedside HEENT:  Normocephalic and atraumatic. Neck:  No JVD.  Lungs: Normal respiratory effort on room air.  Slight decreased breath sounds in to bases but otherwise clear to auscultation without appreciable crackles or wheezes. Heart: HRRR . Normal S1 and S2 without gallops or murmurs. Radial & DP pulses 2+ bilaterally. Abdomen: Non-distended appearing.  Msk: Normal strength and tone for age. Extremities: Warm and well perfused. No clubbing, cyanosis.  2+ bilateral lower extremity edema.  Neuro: Alert and oriented X 3.  Slight resting tremor to right upper extremity Psych:  Answers questions appropriately.   Labs:   Lab Results  Component Value Date   WBC 6.8 06/01/2022   HGB 6.7 (L) 06/02/2022   HCT 23.8 (L) 06/01/2022   MCV 101.3 (H) 06/01/2022   PLT  222 06/01/2022    Recent Labs  Lab 06/01/22 1636 06/01/22 1856  NA 140  --   K 4.5  --   CL 111  --   CO2 24  --   BUN 25*  --   CREATININE 1.25*  --   CALCIUM 8.6*  --   PROT  --  6.3*  BILITOT  --  0.5  ALKPHOS  --  108  ALT  --  30  AST  --  23  GLUCOSE 149*  --    No results found for: "CKTOTAL", "CKMB", "CKMBINDEX", "TROPONINI"  Lab Results  Component Value Date   CHOL 153 02/13/2022   Lab Results  Component Value Date   HDL 30 (L) 02/13/2022   Lab Results  Component  Value Date   LDLCALC 95 02/13/2022   Lab Results  Component Value Date   TRIG 139 02/13/2022   Lab Results  Component Value Date   CHOLHDL 5.1 02/13/2022   No results found for: "LDLDIRECT"    Radiology: DG Chest 2 View  Result Date: 06/01/2022 CLINICAL DATA:  Dyspnea and leg swelling EXAM: CHEST - 2 VIEW COMPARISON:  None Available. FINDINGS: Mild cardiomegaly. Pulmonary vascular congestion. Small right possible trace left pleural effusions and associated basilar atelectasis. No pneumothorax. No acute osseous abnormality. Aortic atherosclerotic calcification. IMPRESSION: Cardiomegaly and pulmonary vascular congestion. Small right and trace left pleural effusions. Aortic Atherosclerosis (ICD10-I70.0). Electronically Signed   By: Placido Sou M.D.   On: 06/01/2022 17:06   CT Head Wo Contrast  Result Date: 05/04/2022 CLINICAL DATA:  Unwitnessed fall this morning, laceration along the top of the. Anticoagulation therapy (Eliquis). EXAM: CT HEAD WITHOUT CONTRAST CT CERVICAL SPINE WITHOUT CONTRAST TECHNIQUE: Multidetector CT imaging of the head and cervical spine was performed following the standard protocol without intravenous contrast. Multiplanar CT image reconstructions of the cervical spine were also generated. RADIATION DOSE REDUCTION: This exam was performed according to the departmental dose-optimization program which includes automated exposure control, adjustment of the mA and/or kV according  to patient size and/or use of iterative reconstruction technique. COMPARISON:  Multiple exams, including CT head 02/12/2022 FINDINGS: CT HEAD FINDINGS Brain: The brainstem, cerebellum, cerebral peduncles, thalami, basal ganglia, basilar cisterns, and ventricular system appear within normal limits. 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 No intracranial hemorrhage, mass lesion, or acute CVA. Vascular: There is atherosclerotic calcification of the cavernous carotid arteries bilaterally. Skull: Small right frontal osteoma along the outer table, unchanged. Sinuses/Orbits: Chronic bilateral maxillary sinus despite maxillary antrostomies. Partial ethmoidectomies bilaterally. Chronic sinusitis involving remaining ethmoid air cells. Chronic left sphenoid sinusitis. Small right mastoid effusion.  Prosthetic left globe. Other: No supplemental non-categorized findings. CT CERVICAL SPINE FINDINGS Alignment: No vertebral subluxation is observed. Skull base and vertebrae: No fracture or acute bony findings. Soft tissues and spinal canal: Bilateral common carotid atherosclerotic calcification. Disc levels: Mild right and borderline left foraminal stenosis at C3-4 due to uncinate and facet spurring. Suspected mild central narrowing of the thecal sac at C3-4, C4-5, C5-6, and C6-7 due to calcified disc bulges/disc protrusions. Calcified portion of the C1 transverse ligament. Upper chest: Unremarkable Other: No supplemental non-categorized findings. IMPRESSION: 1. No acute intracranial findings are acute cervical spine findings. 2. Chronic paranasal sinusitis.  Small right mastoid effusion. 3. Carotid atherosclerotic calcification. 4. Suspected mild levels of cervical impingement due to spondylosis and degenerative disc disease. 5. 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  Electronically Signed   By: Van Clines M.D.   On: 05/04/2022 13:28   CT Cervical Spine Wo Contrast  Result Date: 05/04/2022 CLINICAL DATA:  Unwitnessed fall this morning, laceration along the top of the. Anticoagulation therapy (Eliquis). EXAM: CT HEAD WITHOUT CONTRAST CT CERVICAL SPINE WITHOUT CONTRAST TECHNIQUE: Multidetector CT imaging of the head and cervical spine was performed following the standard protocol without intravenous contrast. Multiplanar CT image reconstructions of the cervical spine were also generated. RADIATION DOSE REDUCTION: This exam was performed according to the departmental dose-optimization program which includes automated exposure control, adjustment of the mA and/or kV according to patient size and/or use of iterative reconstruction technique. COMPARISON:  Multiple exams, including CT head 02/12/2022 FINDINGS: CT HEAD FINDINGS Brain: The brainstem, cerebellum,  cerebral peduncles, thalami, basal ganglia, basilar cisterns, and ventricular system appear within normal limits. 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 No intracranial hemorrhage, mass lesion, or acute CVA. Vascular: There is atherosclerotic calcification of the cavernous carotid arteries bilaterally. Skull: Small right frontal osteoma along the outer table, unchanged. Sinuses/Orbits: Chronic bilateral maxillary sinus despite maxillary antrostomies. Partial ethmoidectomies bilaterally. Chronic sinusitis involving remaining ethmoid air cells. Chronic left sphenoid sinusitis. Small right mastoid effusion.  Prosthetic left globe. Other: No supplemental non-categorized findings. CT CERVICAL SPINE FINDINGS Alignment: No vertebral subluxation is observed. Skull base and vertebrae: No fracture or acute bony findings. Soft tissues and spinal canal: Bilateral common carotid atherosclerotic calcification. Disc levels: Mild right and borderline  left foraminal stenosis at C3-4 due to uncinate and facet spurring. Suspected mild central narrowing of the thecal sac at C3-4, C4-5, C5-6, and C6-7 due to calcified disc bulges/disc protrusions. Calcified portion of the C1 transverse ligament. Upper chest: Unremarkable Other: No supplemental non-categorized findings. IMPRESSION: 1. No acute intracranial findings are acute cervical spine findings. 2. Chronic paranasal sinusitis.  Small right mastoid effusion. 3. Carotid atherosclerotic calcification. 4. Suspected mild levels of cervical impingement due to spondylosis and degenerative disc disease. 5. 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 Electronically Signed   By: Van Clines M.D.   On: 05/04/2022 13:28    Lexiscan Myoview 03/02/2022 normal LVEF 70% with small mild reversible basal, mid, and apical inferior wall defects  ECHO 03/02/2022 LVEF 62% with mild MR, TR  TELEMETRY reviewed by me: Sinus rhythm rate between 59 and 67.  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  Cardiology is consulted for further assistance.  #Dyspnea on exertion/elevated BNP The patient presents with a 2-week history of dyspnea on exertion, and bilateral lower extremity edema.  BNP is elevated to 180, with bilateral pleural effusions right greater than sign left on chest x-ray, and clinical signs of volume overload in his lower extremities.  He was given 20 mg of p.o. Lasix to take as needed by his oncologist, and has done so 3-4 times over the  past month.  LVEF from 02/2022 was normal at 62%. -S/p 40 mg of IV Lasix x2 with brisk diuresis.  Continue 40 mg IV Lasix twice daily for now.  Will likely discharge on a daily diuretic. -Hold lisinopril 10 mg with a slight AKI with diuresis, likely resume at discharge. -echocardiogram complete, further recommendations based on results -Likely conservative management from a cardiac standpoint  #GI bleed with history of iron deficiency anemia Patient denies overt melena, hematochezia, although noted to have Hemoccult positive stool.  Hemoglobin has down trended from 10.0 04/2024, 7.3 on admission, downtrending to 6.7 early morning of 8/15.  Iron panel with low serum iron at 36, TIBC low 227, saturation ratios 16, with normal ferritin at 61. -Agree with Protonix for GI protection, holding Eliquis as below -Agree with transfusion -He medically optimized cardiac perspective to undergo EGD if needed   #Paroxysmal atrial fibrillation Currently in sinus rhythm, rate controlled at 61. -Hold Eliquis  5 mg twice daily for now in the setting of GI bleed as above.  Would favor resuming this, once stable from a GI perspective.  CHA2DS2-VASc 5. -Continue amiodarone 200 mg once daily through the end of August, then stop entirely.  -Defer beta-blocker, CCB due to baseline bradycardia -TSH noted to be 5.3 without a baseline to compare from prior to starting amiodarone in April of this year.  Recommend continued follow-up of this on an outpatient basis.  #Hyperlipidemia Continue atorvastatin 80 mg daily.  This patient's plan of care was discussed and created with Dr. Saralyn Pilar and he is in agreement.  Signed: Tristan Schroeder , PA-C 06/02/2022, 8:03 AM [facility] Cardiology

## 2022-06-02 NOTE — ED Notes (Signed)
Echo at bedside

## 2022-06-02 NOTE — Progress Notes (Signed)
       CROSS COVER NOTE  NAME: Kevin Love MRN: [id number] DOB : 1946-09-25    Date of Service   06/02/22  HPI/Events of Note   Notified by nursing that HGB is 6.7 down from 7.3  Interventions   Plan: 1U PRBC Monitor for changes in respiratory status, will give Lasix if needed     This document was prepared using Dragon voice recognition software and may include unintentional dictation errors.  Neomia Glass DNP, MHA, FNP-BC Nurse Practitioner [facility] Pager [phone]

## 2022-06-03 ENCOUNTER — Other Ambulatory Visit: Payer: Self-pay

## 2022-06-03 ENCOUNTER — Inpatient Hospital Stay: Payer: Medicare Other | Admitting: Anesthesiology

## 2022-06-03 ENCOUNTER — Encounter: Payer: Self-pay | Admitting: Internal Medicine

## 2022-06-03 ENCOUNTER — Encounter
Admission: EM | Disposition: A | Discharge status: Home / Self Care | Payer: Self-pay | Source: Ambulatory Visit | Attending: Internal Medicine

## 2022-06-03 DIAGNOSIS — C9210 Chronic myeloid leukemia, BCR/ABL-positive, not having achieved remission: Secondary | ICD-10-CM | POA: Diagnosis not present

## 2022-06-03 DIAGNOSIS — D62 Acute posthemorrhagic anemia: Secondary | ICD-10-CM | POA: Diagnosis not present

## 2022-06-03 DIAGNOSIS — I4891 Unspecified atrial fibrillation: Secondary | ICD-10-CM

## 2022-06-03 DIAGNOSIS — E663 Overweight: Secondary | ICD-10-CM | POA: Diagnosis not present

## 2022-06-03 HISTORY — PX: ESOPHAGOGASTRODUODENOSCOPY (EGD) WITH PROPOFOL: SHX5813

## 2022-06-03 HISTORY — PX: COLONOSCOPY WITH PROPOFOL: SHX5780

## 2022-06-03 LAB — CBC
HCT: 24.6 % — ABNORMAL LOW (ref 39.0–52.0)
Hemoglobin: 7.8 g/dL — ABNORMAL LOW (ref 13.0–17.0)
MCH: 30 pg (ref 26.0–34.0)
MCHC: 31.7 g/dL (ref 30.0–36.0)
MCV: 94.6 fL (ref 80.0–100.0)
Platelets: 213 10*3/uL (ref 150–400)
RBC: 2.6 MIL/uL — ABNORMAL LOW (ref 4.22–5.81)
RDW: 16.4 % — ABNORMAL HIGH (ref 11.5–15.5)
WBC: 6 10*3/uL (ref 4.0–10.5)
nRBC: 0 % (ref 0.0–0.2)

## 2022-06-03 LAB — BPAM RBC
Blood Product Expiration Date: 202308232359
ISSUE DATE / TIME: 202308150337
ISSUING PHYSICIAN: NEGATIVE
Unit Type and Rh: 6200

## 2022-06-03 LAB — TYPE AND SCREEN
ABO/RH(D): A POS
Antibody Screen: NEGATIVE
Unit division: 0

## 2022-06-03 LAB — BASIC METABOLIC PANEL
Anion gap: 4 — ABNORMAL LOW (ref 5–15)
BUN: 19 mg/dL (ref 8–23)
CO2: 27 mmol/L (ref 22–32)
Calcium: 8.1 mg/dL — ABNORMAL LOW (ref 8.9–10.3)
Chloride: 112 mmol/L — ABNORMAL HIGH (ref 98–111)
Creatinine, Ser: 1.18 mg/dL (ref 0.61–1.24)
GFR, Estimated: >60 mL/min (ref >60)
Glucose, Bld: 109 mg/dL — ABNORMAL HIGH (ref 70–99)
Potassium: 3.9 mmol/L (ref 3.5–5.1)
Sodium: 143 mmol/L (ref 135–145)

## 2022-06-03 LAB — MAGNESIUM: Magnesium: 0.8 mg/dL — CL (ref 1.7–2.4)

## 2022-06-03 SURGERY — ESOPHAGOGASTRODUODENOSCOPY (EGD) WITH PROPOFOL
Anesthesia: General

## 2022-06-03 MED ORDER — MAGNESIUM SULFATE 4 GM/100ML IV SOLN
4.0000 g | Freq: Once | INTRAVENOUS | Status: AC
  Administered 2022-06-03: 4 g via INTRAVENOUS
  Filled 2022-06-03: qty 100

## 2022-06-03 MED ORDER — POLYETHYLENE GLYCOL 3350 17 G PO PACK
17.0000 g | PACK | Freq: Once | ORAL | Status: AC
  Administered 2022-06-03: 17 g via ORAL
  Filled 2022-06-03: qty 1

## 2022-06-03 MED ORDER — PROPOFOL 500 MG/50ML IV EMUL
INTRAVENOUS | Status: DC | PRN
  Administered 2022-06-03: 150 ug/kg/min via INTRAVENOUS

## 2022-06-03 MED ORDER — LIDOCAINE HCL (CARDIAC) PF 100 MG/5ML IV SOSY
PREFILLED_SYRINGE | INTRAVENOUS | Status: DC | PRN
  Administered 2022-06-03: 80 mg via INTRAVENOUS

## 2022-06-03 MED ORDER — ORAL CARE MOUTH RINSE
15.0000 mL | OROMUCOSAL | Status: DC | PRN
Start: 2022-06-03 — End: 2022-06-04

## 2022-06-03 MED ORDER — FUROSEMIDE 20 MG PO TABS
20.0000 mg | ORAL_TABLET | Freq: Every day | ORAL | Status: DC
  Administered 2022-06-04: 20 mg via ORAL
  Filled 2022-06-03 (×2): qty 1

## 2022-06-03 MED ORDER — PROPOFOL 10 MG/ML IV BOLUS
INTRAVENOUS | Status: DC | PRN
  Administered 2022-06-03: 3 mg via INTRAVENOUS
  Administered 2022-06-03: 70 mg via INTRAVENOUS

## 2022-06-03 MED ORDER — SODIUM CHLORIDE 0.9 % IV SOLN: INTRAVENOUS | Status: DC | PRN

## 2022-06-03 NOTE — Progress Notes (Signed)
Patient's Mag is low 0.8, [place] Pink, S MD notified.

## 2022-06-03 NOTE — Assessment & Plan Note (Signed)
Continue Sinemet ?

## 2022-06-03 NOTE — Plan of Care (Signed)
  Problem: Health Behavior/Discharge Planning: Goal: Ability to manage health-related needs will improve Outcome: Progressing   Problem: Clinical Measurements: Goal: Will remain free from infection Outcome: Progressing   Problem: Clinical Measurements: Goal: Respiratory complications will improve Outcome: Progressing   Problem: Clinical Measurements: Goal: Cardiovascular complication will be avoided Outcome: Progressing   Problem: Elimination: Goal: Will not experience complications related to bowel motility Outcome: Progressing   Problem: Elimination: Goal: Will not experience complications related to urinary retention Outcome: Progressing   Problem: Safety: Goal: Ability to remain free from injury will improve Outcome: Progressing

## 2022-06-03 NOTE — Care Plan (Signed)
EGD/Colon performed. Small avm in colon that was cauterized. Localized area of erythema that was biopsied. Will plan on video capsule tomorrow. Likely can be discharged after study completed tomorrow afternoon and f/u as an outpatient to discuss the results. Would be ok to resume eliquis at discharge due to high CHADSVASC score and no significant findings on EGD/Colonoscopy. Will need PO iron supplementation.  Raylene Miyamoto MD, MPH [place]

## 2022-06-03 NOTE — Progress Notes (Signed)
[place] NOTE       Patient ID: Kevin Love MRN: [id number] DOB/AGE: August 22, 1946 76 y.o.  Admit date: 06/01/2022 Referring Physician Dr. Myles Rosenthal Primary Physician Dr. Harrel Lemon Primary Cardiologist Dr. 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  Cardiology is consulted for further assistance.  Interval history: -Breathing has much improved, diuresed a total of 1.3 L since admission -had melena during bowel prep last night. Peripheral edema much improved.  No chest pain, palpitations, dizziness. -He is hungry, eager to have EGD/colonoscopy performed and go home  Review of systems complete and found to be negative unless listed above    Past Medical History:  Diagnosis Date   Chronic Leukemia    Glaucoma    Parkinson disease ([place]) 07/25/2020    Past Surgical History:  Procedure Laterality Date   BACK SURGERY      Medications Prior to Admission  Medication Sig Dispense Refill Last Dose   acetaminophen (TYLENOL) 500 MG tablet Take 1,000 mg by mouth every 6 (six) hours as needed for moderate pain.   06/01/2022 at 0800   amiodarone (PACERONE) 400 MG tablet Take 1 tablet (400 mg total) by mouth daily. (Patient taking differently: Take 200 mg by mouth daily.) 60 tablet 0 06/01/2022 at 0800   apixaban (ELIQUIS) 5 MG TABS tablet Take 1 tablet (5 mg total) by mouth 2 (two) times daily. 60 tablet 0 06/01/2022 at 0800   atorvastatin (LIPITOR) 80 MG tablet Take 1 tablet (80 mg total) by mouth daily. 30 tablet 0 06/01/2022 at 1200   carbidopa-levodopa (SINEMET IR) 25-100  MG tablet Take 1 tablet by mouth 2 (two) times daily.   06/01/2022 at 0800   clonazePAM (KLONOPIN) 0.5 MG tablet Take 0.5 mg by mouth daily.   05/31/2022 at 2000   cyanocobalamin 1000 MCG tablet Take 1,000 mcg by mouth daily.   06/01/2022 at 0800   dasatinib (SPRYCEL) 20 MG tablet Take 1 tablet (20 mg total) by mouth daily. 30 tablet 11 05/31/2022 at 2000   furosemide (LASIX) 20 MG tablet Take 1 tablet (20 mg total) by mouth daily as needed (take daily in the morning as needed for ankle swelling). 30 tablet 1 06/01/2022 at 0800   latanoprost (XALATAN) 0.005 % ophthalmic solution Place 1 drop into the right eye at bedtime.   05/31/2022 at 2000   lisinopril (ZESTRIL) 10 MG tablet Take 10 mg by mouth daily.   06/01/2022 at 0800   memantine (NAMENDA) 10 MG tablet Take 10 mg by mouth 2 (two) times daily.   06/01/2022 at 0800   Multiple Vitamins-Minerals (CENTRUM SILVER 50+MEN PO) Take 1 tablet by mouth daily.   06/01/2022 at 0800   tamsulosin (FLOMAX) 0.4 MG CAPS capsule Take 0.4 mg by mouth daily.   05/31/2022 at 2000   Cholecalciferol 50 MCG (2000 UT) CAPS Take 2,000 Units by mouth daily. (Patient not taking: Reported on 06/01/2022)   Not Taking   promethazine (PHENERGAN) 6.25 MG/5ML syrup Take by mouth. (Patient not taking: Reported on 06/01/2022)   Not Taking    Social History   Socioeconomic History   Marital status: Married  Spouse name: Not on file   Number of children: Not on file   Years of education: Not on file   Highest education level: Not on file  Occupational History   Not on file  Tobacco Use   Smoking status: Former    Types: Cigarettes   Smokeless tobacco: Never  Substance and Sexual Activity   Alcohol use: Never   Drug use: Never   Sexual activity: Not Currently  Other Topics Concern   Not on file  Social History Narrative   Not on file   Social Determinants of Health   Financial Resource Strain: Low Risk  (08/22/2020)   Overall Financial Resource Strain (CARDIA)     Difficulty of Paying Living Expenses: Not hard at all  Food Insecurity: No Food Insecurity (08/22/2020)   Hunger Vital Sign    Worried About Running Out of Food in the Last Year: Never true    [place] in the Last Year: Never true  Transportation Needs: No Transportation Needs (08/22/2020)   PRAPARE - Hydrologist (Medical): No    Lack of Transportation (Non-Medical): No  Physical Activity: Insufficiently Active (08/22/2020)   Exercise Vital Sign    Days of Exercise per Week: 7 days    Minutes of Exercise per Session: 10 min  Stress: No Stress Concern Present (08/22/2020)   [place]    Feeling of Stress : Not at all  Social Connections: Moderately Integrated (08/22/2020)   Social Connection and Isolation Panel [NHANES]    Frequency of Communication with Friends and Family: Three times a week    Frequency of Social Gatherings with Friends and Family: Once a week    Attends Religious Services: More than 4 times per year    Active Member of [employer] or Organizations: No    Attends Archivist Meetings: Never    Marital Status: Married  Human resources officer Violence: Not At Risk (08/22/2020)   Humiliation, Afraid, Rape, and Kick questionnaire    Fear of Current or Ex-Partner: No    Emotionally Abused: No    Physically Abused: No    Sexually Abused: No    No family history on file.    PHYSICAL EXAM General: Pleasant elderly Caucasian male, well nourished, in no acute distress.  Lying in incline in PCU bed.   HEENT:  Normocephalic and atraumatic. Neck:  No JVD.  Lungs: Normal respiratory effort on room air.  Clear to auscultation bilaterally with improved aeration, no appreciable crackles or wheezes.   Heart: HRRR . Normal S1 and S2 without gallops or murmurs. Radial & DP pulses 2+ bilaterally. Abdomen: Non-distended appearing.  Msk: Normal strength and tone for age. Extremities:  Warm and well perfused. No clubbing, cyanosis.  Trace to 1+ bilateral lower extremity edema, much improved from yesterday neuro: Alert and oriented X 3.  Slight resting tremor to right upper extremity Psych:  Answers questions appropriately.   Labs:   Lab Results  Component Value Date   WBC 6.0 06/03/2022   HGB 7.8 (L) 06/03/2022   HCT 24.6 (L) 06/03/2022   MCV 94.6 06/03/2022   PLT 213 06/03/2022    Recent Labs  Lab 06/02/22 0827 06/03/22 0453  NA 141 143  K 3.8 3.9  CL 111 112*  CO2 26 27  BUN 21 19  CREATININE 1.20 1.18  CALCIUM 8.6* 8.1*  PROT 6.6  --   BILITOT 0.9  --  ALKPHOS 120  --   ALT 35  --   AST 25  --   GLUCOSE 122* 109*    No results found for: "CKTOTAL", "CKMB", "CKMBINDEX", "TROPONINI"  Lab Results  Component Value Date   CHOL 153 02/13/2022   Lab Results  Component Value Date   HDL 30 (L) 02/13/2022   Lab Results  Component Value Date   LDLCALC 95 02/13/2022   Lab Results  Component Value Date   TRIG 139 02/13/2022   Lab Results  Component Value Date   CHOLHDL 5.1 02/13/2022   No results found for: "LDLDIRECT"    Radiology: ECHOCARDIOGRAM COMPLETE  Result Date: 06/02/2022    ECHOCARDIOGRAM REPORT   Patient Name:   Olsen Hrivnak Date of Exam: 06/02/2022 Medical Rec #:  [id number]    Height:       64.0 in Accession #:    [id number]   Weight:       170.0 lb Date of Birth:  1946/03/16    BSA:          1.826 m Patient Age:    87 years     BP:           152/65 mmHg Patient Gender: M            HR:           62 bpm. Exam Location:  [facility] Procedure: 2D Echo, Cardiac Doppler and Color Doppler Indications:     I50.31 CHF Acute Diastolic  History:         Patient has no prior history of Echocardiogram examinations.                  CHF, CML, Arrythmias:Atrial Fibrillation; Risk Factors:Former                  Smoker and Hypertension.  Sonographer:     Rosalia Hammers Referring Phys:  [id number] SARA-MAIZ A THOMAS Diagnosing Phys: Isaias Cowman MD   Sonographer Comments: Suboptimal parasternal window. Image acquisition challenging due to respiratory motion. IMPRESSIONS  1. Left ventricular ejection fraction, by estimation, is 60 to 65%. The left ventricle has normal function. The left ventricle has no regional wall motion abnormalities. Left ventricular diastolic parameters were normal.  2. Right ventricular systolic function is normal. The right ventricular size is normal.  3. The mitral valve is normal in structure. Mild to moderate mitral valve regurgitation. No evidence of mitral stenosis.  4. Tricuspid valve regurgitation is mild to moderate.  5. The aortic valve is normal in structure. Aortic valve regurgitation is mild. No aortic stenosis is present.  6. The inferior vena cava is normal in size with greater than 50% respiratory variability, suggesting right atrial pressure of 3 mmHg. FINDINGS  Left Ventricle: Left ventricular ejection fraction, by estimation, is 60 to 65%. The left ventricle has normal function. The left ventricle has no regional wall motion abnormalities. The left ventricular internal cavity size was normal in size. There is  no left ventricular hypertrophy. Left ventricular diastolic parameters were normal. Right Ventricle: The right ventricular size is normal. No increase in right ventricular wall thickness. Right ventricular systolic function is normal. Left Atrium: Left atrial size was normal in size. Right Atrium: Right atrial size was normal in size. Pericardium: There is no evidence of pericardial effusion. Mitral Valve: The mitral valve is normal in structure. Mild to moderate mitral valve regurgitation. No evidence of mitral valve stenosis. Tricuspid Valve: The tricuspid valve is normal in structure.  Tricuspid valve regurgitation is mild to moderate. No evidence of tricuspid stenosis. Aortic Valve: The aortic valve is normal in structure. Aortic valve regurgitation is mild. No aortic stenosis is present. Aortic valve mean  gradient measures 6.0 mmHg. Aortic valve peak gradient measures 10.5 mmHg. Aortic valve area, by VTI measures 2.10  cm. Pulmonic Valve: The pulmonic valve was normal in structure. Pulmonic valve regurgitation is not visualized. No evidence of pulmonic stenosis. Aorta: The aortic root is normal in size and structure. Venous: The inferior vena cava is normal in size with greater than 50% respiratory variability, suggesting right atrial pressure of 3 mmHg. IAS/Shunts: No atrial level shunt detected by color flow Doppler.  LEFT VENTRICLE PLAX 2D LVIDd:         4.94 cm   Diastology LVIDs:         2.89 cm   LV e' medial:    6.74 cm/s LV PW:         1.37 cm   LV E/e' medial:  19.3 LV IVS:        1.22 cm   LV e' lateral:   7.40 cm/s LVOT diam:     2.00 cm   LV E/e' lateral: 17.6 LV SV:         87 LV SV Index:   47 LVOT Area:     3.14 cm  RIGHT VENTRICLE RV Basal diam:  2.94 cm RV Mid diam:    4.13 cm RV S prime:     14.50 cm/s TAPSE (M-mode): 3.4 cm LEFT ATRIUM             Index        RIGHT ATRIUM           Index LA diam:        4.40 cm 2.41 cm/m   RA Area:     19.50 cm LA Vol (A2C):   89.9 ml 49.24 ml/m  RA Volume:   53.10 ml  29.08 ml/m LA Vol (A4C):   79.1 ml 43.32 ml/m LA Biplane Vol: 84.9 ml 46.50 ml/m  AORTIC VALVE AV Area (Vmax):    2.13 cm AV Area (Vmean):   1.99 cm AV Area (VTI):     2.10 cm AV Vmax:           162.00 cm/s AV Vmean:          114.000 cm/s AV VTI:            0.413 m AV Peak Grad:      10.5 mmHg AV Mean Grad:      6.0 mmHg LVOT Vmax:         110.00 cm/s LVOT Vmean:        72.300 cm/s LVOT VTI:          0.276 m LVOT/AV VTI ratio: 0.67  AORTA Ao Root diam: 3.60 cm MITRAL VALVE                TRICUSPID VALVE MV Area (PHT): 4.15 cm     TR Peak grad:   39.9 mmHg MV Decel Time: 183 msec     TR Vmax:        316.00 cm/s MV E velocity: 130.00 cm/s MV A velocity: 101.00 cm/s  SHUNTS MV E/A ratio:  1.29         Systemic VTI:  0.28 m  Systemic Diam: 2.00 cm Isaias Cowman MD Electronically signed by Isaias Cowman MD Signature Date/Time: 06/02/2022/10:37:57 AM    Final    DG Chest 2 View  Result Date: 06/01/2022 CLINICAL DATA:  Dyspnea and leg swelling EXAM: CHEST - 2 VIEW COMPARISON:  None Available. FINDINGS: Mild cardiomegaly. Pulmonary vascular congestion. Small right possible trace left pleural effusions and associated basilar atelectasis. No pneumothorax. No acute osseous abnormality. Aortic atherosclerotic calcification. IMPRESSION: Cardiomegaly and pulmonary vascular congestion. Small right and trace left pleural effusions. Aortic Atherosclerosis (ICD10-I70.0). Electronically Signed   By: Placido Sou M.D.   On: 06/01/2022 17:06   CT Head Wo Contrast  Result Date: 05/04/2022 CLINICAL DATA:  Unwitnessed fall this morning, laceration along the top of the. Anticoagulation therapy (Eliquis). EXAM: CT HEAD WITHOUT CONTRAST CT CERVICAL SPINE WITHOUT CONTRAST TECHNIQUE: Multidetector CT imaging of the head and cervical spine was performed following the standard protocol without intravenous contrast. Multiplanar CT image reconstructions of the cervical spine were also generated. RADIATION DOSE REDUCTION: This exam was performed according to the departmental dose-optimization program which includes automated exposure control, adjustment of the mA and/or kV according to patient size and/or use of iterative reconstruction technique. COMPARISON:  Multiple exams, including CT head 02/12/2022 FINDINGS: CT HEAD FINDINGS Brain: The brainstem, cerebellum, cerebral peduncles, thalami, basal ganglia, basilar cisterns, and ventricular system appear within normal limits. 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 No intracranial hemorrhage, mass lesion, or acute CVA. Vascular: There is atherosclerotic calcification of the cavernous carotid arteries bilaterally. Skull: Small right frontal osteoma  along the outer table, unchanged. Sinuses/Orbits: Chronic bilateral maxillary sinus despite maxillary antrostomies. Partial ethmoidectomies bilaterally. Chronic sinusitis involving remaining ethmoid air cells. Chronic left sphenoid sinusitis. Small right mastoid effusion.  Prosthetic left globe. Other: No supplemental non-categorized findings. CT CERVICAL SPINE FINDINGS Alignment: No vertebral subluxation is observed. Skull base and vertebrae: No fracture or acute bony findings. Soft tissues and spinal canal: Bilateral common carotid atherosclerotic calcification. Disc levels: Mild right and borderline left foraminal stenosis at C3-4 due to uncinate and facet spurring. Suspected mild central narrowing of the thecal sac at C3-4, C4-5, C5-6, and C6-7 due to calcified disc bulges/disc protrusions. Calcified portion of the C1 transverse ligament. Upper chest: Unremarkable Other: No supplemental non-categorized findings. IMPRESSION: 1. No acute intracranial findings are acute cervical spine findings. 2. Chronic paranasal sinusitis.  Small right mastoid effusion. 3. Carotid atherosclerotic calcification. 4. Suspected mild levels of cervical impingement due to spondylosis and degenerative disc disease. 5. 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 Electronically Signed   By: Van Clines M.D.   On: 05/04/2022 13:28   CT Cervical Spine Wo Contrast  Result Date: 05/04/2022 CLINICAL DATA:  Unwitnessed fall this morning, laceration along the top of the. Anticoagulation therapy (Eliquis). EXAM: CT HEAD WITHOUT CONTRAST CT CERVICAL SPINE WITHOUT CONTRAST TECHNIQUE: Multidetector CT imaging of the head and cervical spine was performed following the standard protocol without intravenous contrast. Multiplanar CT image reconstructions of the cervical spine were also generated. RADIATION DOSE REDUCTION: This exam was performed according to the  departmental dose-optimization program which includes automated exposure control, adjustment of the mA and/or kV according to patient size and/or use of iterative reconstruction technique. COMPARISON:  Multiple exams, including CT head 02/12/2022 FINDINGS: CT HEAD FINDINGS Brain: The brainstem, cerebellum, cerebral peduncles, thalami, basal ganglia, basilar cisterns, and ventricular system appear within normal limits. Periventricular white matter and corona  radiata hypodensities favor chronic ischemic microvascular white matter disease. No intracranial hemorrhage, mass lesion, or acute CVA. Vascular: There is atherosclerotic calcification of the cavernous carotid arteries bilaterally. Skull: Small right frontal osteoma along the outer table, unchanged. Sinuses/Orbits: Chronic bilateral maxillary sinus despite maxillary antrostomies. Partial ethmoidectomies bilaterally. Chronic sinusitis involving remaining ethmoid air cells. Chronic left sphenoid sinusitis. Small right mastoid effusion.  Prosthetic left globe. Other: No supplemental non-categorized findings. CT CERVICAL SPINE FINDINGS Alignment: No vertebral subluxation is observed. Skull base and vertebrae: No fracture or acute bony findings. Soft tissues and spinal canal: Bilateral common carotid atherosclerotic calcification. Disc levels: Mild right and borderline left foraminal stenosis at C3-4 due to uncinate and facet spurring. Suspected mild central narrowing of the thecal sac at C3-4, C4-5, C5-6, and C6-7 due to calcified disc bulges/disc protrusions. Calcified portion of the C1 transverse ligament. Upper chest: Unremarkable Other: No supplemental non-categorized findings. IMPRESSION: 1. No acute intracranial findings are acute cervical spine findings. 2. Chronic paranasal sinusitis.  Small right mastoid effusion. 3. Carotid atherosclerotic calcification. 4. Suspected mild levels of cervical impingement due to spondylosis and degenerative disc disease. 5.  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 Electronically Signed   By: Van Clines M.D.   On: 05/04/2022 13:28    Lexiscan Myoview 03/02/2022 normal LVEF 70% with small mild reversible basal, mid, and apical inferior wall defects  ECHO 03/02/2022 LVEF 62% with mild MR, TR  TELEMETRY reviewed by me: Sinus rhythm rate between 59 and 67.  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  Cardiology is consulted for further assistance.  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  BNP is elevated to 180, with bilateral pleural effusions right greater than sign left on chest x-ray, and clinical signs of volume overload in his lower extremities.  He was given 20 mg of p.o. Lasix to take as needed by his oncologist, and has done so 3-4 times over the past month.  LVEF from 02/2022 was normal at 62%. -S/p 40 mg of IV Lasix x3 with excellent diuresis and symptom improvement. -Will discharge on lasix '20mg'$  PO daily.  -Hold lisinopril 10 mg with a slight AKI with diuresis, ok resume at discharge. -echocardiogram complete resulted with preserved LVEF of 60-65%, mild to moderate MR, mild-moderate TR, mild AR. -No further diagnostics necessary from a cardiac standpoint.   Follow-up with Dr. Saralyn Pilar in 1 to 2 weeks.  #GI bleed with history of iron deficiency anemia Patient denies overt melena, hematochezia, although noted to have  Hemoccult positive stool.  Hemoglobin has down trended from 10.0 04/2024, 7.3 on admission, downtrending to 6.7 early morning of 8/15.  Iron panel with low serum iron at 36, TIBC low 227, saturation ratios 16, with normal ferritin at 61. -Agree with Protonix for GI protection, holding Eliquis as below -Agree with transfusion of PRBCs -He medically optimized cardiac perspective to undergo EGD/colonoscopy, scheduled for this morning.  #Paroxysmal atrial fibrillation Currently in sinus rhythm, rate controlled at 61. -Hold  Eliquis 5 mg twice daily for now in the setting of GI bleed as above.  Would favor resuming this, once stable from a GI perspective.  CHA2DS2-VASc 5. -Continue amiodarone 200 mg once daily through the end of August, then stop entirely.  -Defer beta-blocker, CCB due to baseline bradycardia -TSH noted to be 5.3 without a baseline to compare from prior to starting amiodarone in April of this year.  Recommend continued follow-up of this on an outpatient basis.  #Hyperlipidemia Continue atorvastatin 80 mg daily.  Cardiology signing off.  Please reengage if needed.   This patient's plan of care was discussed and created with Dr. Saralyn Pilar and he is in agreement.  Signed: Tristan Schroeder , PA-C 06/03/2022, 9:03 AM [facility]

## 2022-06-03 NOTE — Assessment & Plan Note (Signed)
Meets criteria with BMI greater than 25 

## 2022-06-03 NOTE — Anesthesia Preprocedure Evaluation (Addendum)
Anesthesia Evaluation  Patient identified by MRN, date of birth, ID band Patient awake    Reviewed: Allergy & Precautions, NPO status , Patient's Chart, lab work & pertinent test results  Airway Mallampati: III  TM Distance: >3 FB Neck ROM: full    Dental  (+) Partial Lower   Pulmonary neg pulmonary ROS, former smoker,    Pulmonary exam normal        Cardiovascular hypertension, +CHF (HFpEF 60 to 65%. Mild to moderate tricuspid regurgitation.  Mild to moderate mitral valve regurgitation.) and + PND  Normal cardiovascular exam+ dysrhythmias (on Eliquis) Atrial Fibrillation      Neuro/Psych Parkinson diseaseCVA (CVA Left MCA 02/12/22 )    GI/Hepatic negative GI ROS, Neg liver ROS,   Endo/Other  negative endocrine ROS  Renal/GU negative Renal ROS  negative genitourinary   Musculoskeletal   Abdominal Normal abdominal exam  (+)   Peds  Hematology  (+) Blood dyscrasia, anemia , chronic myelocytic leukemia   Anesthesia Other Findings S/p blood transfusion. Last eliquis 8/14.   Past Medical History: No date: Chronic Leukemia No date: Glaucoma 07/25/2020: Parkinson disease ([place])  Past Surgical History: No date: BACK SURGERY  BMI    Body Mass Index: 27.02 kg/m      Reproductive/Obstetrics negative OB ROS                            Anesthesia Physical Anesthesia Plan  ASA: 3  Anesthesia Plan: General   Post-op Pain Management: Minimal or no pain anticipated   Induction: Intravenous  PONV Risk Score and Plan: Propofol infusion, TIVA and Treatment may vary due to age or medical condition  Airway Management Planned: Natural Airway  Additional Equipment:   Intra-op Plan:   Post-operative Plan:   Informed Consent: I have reviewed the patients History and Physical, chart, labs and discussed the procedure including the risks, benefits and alternatives for the proposed anesthesia with  the patient or authorized representative who has indicated his/her understanding and acceptance.     Dental Advisory Given  Plan Discussed with: Anesthesiologist, CRNA and Surgeon  Anesthesia Plan Comments:       Anesthesia Quick Evaluation

## 2022-06-03 NOTE — Assessment & Plan Note (Addendum)
Patient will follow-up with oncology next week, chemo to resume

## 2022-06-03 NOTE — Transfer of Care (Signed)
Immediate Anesthesia Transfer of Care Note  Patient: Kevin Love  Procedure(s) Performed: ESOPHAGOGASTRODUODENOSCOPY (EGD) WITH PROPOFOL COLONOSCOPY WITH PROPOFOL  Patient Location: PACU  Anesthesia Type:General  Level of Consciousness: awake and drowsy  Airway & Oxygen Therapy: Patient Spontanous Breathing  Post-op Assessment: Report given to RN and Post -op Vital signs reviewed and stable  Post vital signs: Reviewed and stable  Last Vitals:  Vitals Value Taken Time  BP 106/50 06/03/22 1407  Temp    Pulse 52 06/03/22 1407  Resp 15 06/03/22 1407  SpO2 95 % 06/03/22 1407    Last Pain:  Vitals:   06/03/22 1304  TempSrc: Temporal  PainSc:          Complications: No notable events documented.

## 2022-06-03 NOTE — Assessment & Plan Note (Signed)
Presumed GI source.  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  No active or large obvious bleeding source identified.  Status post EGD and colonoscopy.  Plan is for video capsule on 8/17.  Eliquis held, but okay to resume as per GI upon discharge.  Status post 1 unit packed red blood cell transfusion

## 2022-06-03 NOTE — Progress Notes (Signed)
Pt completed coloscopy. Will continue to monitor.

## 2022-06-03 NOTE — Op Note (Signed)
[facility] Gastroenterology Patient Name: Kevin Love Procedure Date: 06/03/2022 1:24 PM MRN: [id number] Account #: [id number] Date of Birth: 02-04-1946 Admit Type: Inpatient Age: 76 Room: [facility] ENDO ROOM 1 Gender: Male Note Status: Finalized Instrument Name: Jasper Riling [id number] Procedure:             Colonoscopy Indications:           Iron deficiency anemia secondary to chronic blood loss Providers:             Andrey Farmer MD, MD Referring MD:          Baxter Hire, MD (Referring MD) Medicines:             Monitored Anesthesia Care Complications:         No immediate complications. Estimated blood loss:                         Minimal. Procedure:             Pre-Anesthesia Assessment:                        - Prior to the procedure, a History and Physical was                         performed, and patient medications and allergies were                         reviewed. The patient is competent. The risks and                         benefits of the procedure and the sedation options and                         risks were discussed with the patient. All questions                         were answered and informed consent was obtained.                         Patient identification and proposed procedure were                         verified by the physician, the nurse, the                         anesthesiologist, the anesthetist and the technician                         in the endoscopy suite. Mental Status Examination:                         alert and oriented. Airway Examination: normal                         oropharyngeal airway and neck mobility. Respiratory                         Examination: clear to auscultation. CV Examination:  normal. Prophylactic Antibiotics: The patient does not                         require prophylactic antibiotics. Prior                         Anticoagulants: The patient has taken Eliquis                          (apixaban), last dose was 2 days prior to procedure.                         ASA Grade Assessment: III - A patient with severe                         systemic disease. After reviewing the risks and                         benefits, the patient was deemed in satisfactory                         condition to undergo the procedure. The anesthesia                         plan was to use monitored anesthesia care (MAC).                         Immediately prior to administration of medications,                         the patient was re-assessed for adequacy to receive                         sedatives. The heart rate, respiratory rate, oxygen                         saturations, blood pressure, adequacy of pulmonary                         ventilation, and response to care were monitored                         throughout the procedure. The physical status of the                         patient was re-assessed after the procedure.                        After obtaining informed consent, the colonoscope was                         passed under direct vision. Throughout the procedure,                         the patient's blood pressure, pulse, and oxygen                         saturations were monitored continuously. The  Colonoscope was introduced through the anus and                         advanced to the the terminal ileum. The colonoscopy                         was performed without difficulty. The patient                         tolerated the procedure well. The quality of the bowel                         preparation was fair. Findings:      The perianal and digital rectal examinations were normal.      The terminal ileum appeared normal.      A single small localized angiodysplastic lesion with bleeding was found       in the cecum. Coagulation for hemostasis using argon beam was       successful. Estimated blood loss: none.      A localized  area of mildly erythematous mucosa was found in the       ascending colon. Biopsies were taken with a cold forceps for histology.       Estimated blood loss was minimal.      Internal hemorrhoids were found during retroflexion. The hemorrhoids       were Grade II (internal hemorrhoids that prolapse but reduce       spontaneously).      The exam was otherwise without abnormality on direct and retroflexion       views. Impression:            - Preparation of the colon was fair.                        - The examined portion of the ileum was normal.                        - A single bleeding colonic angiodysplastic lesion.                         Treated with argon beam coagulation.                        - Erythematous mucosa in the ascending colon. Biopsied.                        - Internal hemorrhoids.                        - The examination was otherwise normal on direct and                         retroflexion views. Recommendation:        - Return patient to hospital ward for ongoing care.                        - Advance diet as tolerated.                        - Continue present medications.                        -  Await pathology results.                        - To visualize the small bowel, perform video capsule                         endoscopy tomorrow. Procedure Code(s):     --- Professional ---                        [phone], 59, Colonoscopy, flexible; with control of                         bleeding, any method                        45380, Colonoscopy, flexible; with biopsy, single or                         multiple Diagnosis Code(s):     --- Professional ---                        K64.1, Second degree hemorrhoids                        K55.21, Angiodysplasia of colon with hemorrhage                        K63.89, Other specified diseases of intestine                        D50.0, Iron deficiency anemia secondary to blood loss                         (chronic) CPT  copyright 2019 American Medical Association. All rights reserved. The codes documented in this report are preliminary and upon coder review may  be revised to meet current compliance requirements. Andrey Farmer MD, MD 06/03/2022 2:22:23 PM Number of Addenda: 0 Note Initiated On: 06/03/2022 1:24 PM Scope Withdrawal Time: 0 hours 12 minutes 13 seconds  Total Procedure Duration: 0 hours 17 minutes 1 second  Estimated Blood Loss:  Estimated blood loss was minimal.      [facility]

## 2022-06-03 NOTE — Op Note (Signed)
[facility] Gastroenterology Patient Name: Kevin Love Procedure Date: 06/03/2022 1:25 PM MRN: [id number] Account #: [id number] Date of Birth: June 24, 1946 Admit Type: Inpatient Age: 76 Room: [facility] ENDO ROOM 1 Gender: Male Note Status: Finalized Instrument Name: Upper Endoscope [id number] Procedure:             Upper GI endoscopy Indications:           Iron deficiency anemia secondary to chronic blood loss Providers:             Andrey Farmer MD, MD Referring MD:          Baxter Hire, MD (Referring MD) Medicines:             Monitored Anesthesia Care Complications:         No immediate complications. Procedure:             Pre-Anesthesia Assessment:                        - Prior to the procedure, a History and Physical was                         performed, and patient medications and allergies were                         reviewed. The patient is competent. The risks and                         benefits of the procedure and the sedation options and                         risks were discussed with the patient. All questions                         were answered and informed consent was obtained.                         Patient identification and proposed procedure were                         verified by the physician, the nurse, the                         anesthesiologist, the anesthetist and the technician                         in the endoscopy suite. Mental Status Examination:                         alert and oriented. Airway Examination: normal                         oropharyngeal airway and neck mobility. Respiratory                         Examination: clear to auscultation. CV Examination:                         normal. Prophylactic Antibiotics: The patient does not  require prophylactic antibiotics. Prior                         Anticoagulants: The patient has taken Eliquis                         (apixaban), last dose was  2 days prior to procedure.                         ASA Grade Assessment: III - A patient with severe                         systemic disease. After reviewing the risks and                         benefits, the patient was deemed in satisfactory                         condition to undergo the procedure. The anesthesia                         plan was to use monitored anesthesia care (MAC).                         Immediately prior to administration of medications,                         the patient was re-assessed for adequacy to receive                         sedatives. The heart rate, respiratory rate, oxygen                         saturations, blood pressure, adequacy of pulmonary                         ventilation, and response to care were monitored                         throughout the procedure. The physical status of the                         patient was re-assessed after the procedure.                        After obtaining informed consent, the endoscope was                         passed under direct vision. Throughout the procedure,                         the patient's blood pressure, pulse, and oxygen                         saturations were monitored continuously. The Endoscope                         was introduced through the mouth, and advanced to the  second part of duodenum. The upper GI endoscopy was                         accomplished without difficulty. The patient tolerated                         the procedure well. Findings:      The examined esophagus was normal.      The entire examined stomach was normal.      The examined duodenum was normal. Impression:            - Normal esophagus.                        - Normal stomach.                        - Normal examined duodenum.                        - No specimens collected. Recommendation:        - Perform a colonoscopy today. Procedure Code(s):     --- Professional ---                         [phone], Esophagogastroduodenoscopy, flexible,                         transoral; diagnostic, including collection of                         specimen(s) by brushing or washing, when performed                         (separate procedure) Diagnosis Code(s):     --- Professional ---                        K44.9, Diaphragmatic hernia without obstruction or                         gangrene                        D50.0, Iron deficiency anemia secondary to blood loss                         (chronic) CPT copyright 2019 American Medical Association. All rights reserved. The codes documented in this report are preliminary and upon coder review may  be revised to meet current compliance requirements. Andrey Farmer MD, MD 06/03/2022 2:16:02 PM Number of Addenda: 0 Note Initiated On: 06/03/2022 1:25 PM Estimated Blood Loss:  Estimated blood loss: none.      [facility]

## 2022-06-03 NOTE — Progress Notes (Signed)
[facility] Progress Note  Patient: Kevin Love    AUQ:[id number]  DOA: 06/01/2022    Date of Service: the patient was seen and examined on 06/03/2022  Brief hospital course: 76 year old male with past medical history of Parkinson's disease, atrial fibrillation on Eliquis, hypertension and CML who presented to the emergency room on 8/14, after being sent over by his primary care physician's office, with increasing shortness of breath and lower extremity swelling.  Lab work in both office as well as in the emergency department noted a hemoglobin of 7.3, down from 10 3 weeks prior.  Gastroenterology consulted and patient went for EGD and colonoscopy on 8/16.  Scopes were essentially normal with the exception of one small AVM in colon which was cauterized and may have been source of bleeding.  Plan for video capsule on 8/17.  Patient had mildly elevated BNP on admission and given a dose of Lasix.  Echocardiogram unremarkable.  Assessment and Plan: Assessment and Plan: * Acute blood loss anemia Presumed GI source.  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  No active or large obvious bleeding source identified.  Status post EGD and colonoscopy.  Plan is for video capsule on 8/17.  Eliquis held, but okay to resume as per GI upon discharge.  Status post 1 unit packed red blood cell transfusion  New onset atrial fibrillation ([place]) Followed by cardiology.  Rate controlled.  Okay as per GI to resume Eliquis on discharge.  Continue amiodarone  Fluid overload Mild.  Congestive heart failure ruled out.  Minimally elevated BNP and following 1 dose of Lasix, has diuresed almost 1.5 L.  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        Consultants: Gastroenterology Cardiology  Procedures: Status post endoscopy Status post  colonoscopy Transfusion 1 unit packed red blood cells  Antimicrobials: None  Code Status: Full code   Subjective: Patient with no complaints  Objective: Noted mild bradycardia Vitals:   06/03/22 1407 06/03/22 1457  BP: (!) 106/50 (!) 137/54  Pulse: (!) 52 (!) 56  Resp: 15 16  Temp:  97.9 F (36.6 C)  SpO2: 95% 95%    Intake/Output Summary (Last 24 hours) at 06/03/2022 1604 Last data filed at 06/03/2022 0417 Gross per 24 hour  Intake 360 ml  Output 450 ml  Net -90 ml   Filed Weights   06/01/22 1624 06/03/22 0331  Weight: 77.1 kg 71.4 kg   Body mass index is 27.02 kg/m.  Exam:  General: Alert and oriented x3, no acute distress HEENT: Normocephalic, atraumatic, mucous membranes are slightly dry Cardiovascular: Regular rate and rhythm, S1-S2 Respiratory: Clear to auscultation bilaterally Abdomen: Soft, nontender, nondistended, positive bowel sounds Musculoskeletal: No clubbing or cyanosis or edema in Skin: Skin breaks, tears or lesions Psychiatry: Appropriate, no evidence of psychoses Neurology: Mild intention tremor  Data Reviewed: Hemoglobin down to 7.8, creatinine of 1.18 and magnesium of 0.8  Disposition:  Status is: Inpatient Remains inpatient appropriate because: Video Capsule    Anticipated discharge date: 8/17  Family Communication: Wife at the bedside DVT Prophylaxis: Place and maintain sequential compression device Start: 06/02/22 1524 SCDs Start: 06/01/22 1948    Author: Annita Brod ,MD 06/03/2022 4:04 PM  To reach On-call, see care teams to locate the attending and reach out via [email or web address]. Between 7PM-7AM, please  contact night-coverage If you still have difficulty reaching the attending provider, please page the [facility] (Director on Call) for [facility] on amion for assistance.

## 2022-06-03 NOTE — TOC Initial Note (Signed)
Transition of Care [facility]) - Initial/Assessment Note    Patient Details  Name: Kevin Love MRN: [id number] Date of Birth: 04-27-1946  [facility]) CM/SW Contact:    Kevin Chroman, LCSW Phone Number: 06/03/2022, 11:28 AM  Clinical Narrative: Readmission prevention screen complete. CSW met with patient. Wife at bedside. CSW introduced role and explained that discharge planning would be discussed. PCP is Kevin Lemon, MD. Wife drives him to appointments. [facility] [address]. No issues obtaining medications. He had home health a couple of months ago. Let patient and his wife know [facility] is willing to take him back at discharge if needed. He uses a SPC at home. Also has a standard walker but does not use it. No further concerns. CSW encouraged patient to contact CSW as needed. CSW will continue to follow patient for support and facilitate return home once stable. Wife will transport him home at discharge.                 Expected Discharge Plan: Home/Self Care (potentially with home health depending on needs) Barriers to Discharge: Continued Medical Work up   Patient Goals and CMS Choice        Expected Discharge Plan and Services Expected Discharge Plan: Home/Self Care (potentially with home health depending on needs)     Post Acute Care Choice: NA Living arrangements for the past 2 months: Single Family Home                                      Prior Living Arrangements/Services Living arrangements for the past 2 months: Single Family Home Lives with:: Spouse Patient language and need for interpreter reviewed:: Yes Do you feel safe going back to the place where you live?: Yes      Need for Family Participation in Patient Care: Yes (Comment) Care giver support system in place?: Yes (comment) Current home services: DME Criminal Activity/Legal Involvement Pertinent to Current Situation/Hospitalization: No - Comment as needed  Activities  of Daily Living Home Assistive Devices/Equipment: Cane (specify quad or straight), Eyeglasses, Hearing aid, Dentures (specify type) ADL Screening (condition at time of admission) Patient's cognitive ability adequate to safely complete daily activities?: Yes Is the patient deaf or have difficulty hearing?: No Does the patient have difficulty seeing, even when wearing glasses/contacts?: No Does the patient have difficulty concentrating, remembering, or making decisions?: No Patient able to express need for assistance with ADLs?: Yes Does the patient have difficulty dressing or bathing?: No Independently performs ADLs?: Yes (appropriate for developmental age) Does the patient have difficulty walking or climbing stairs?: No Weakness of Legs: None Weakness of Arms/Hands: None  Permission Sought/Granted Permission sought to share information with : Family Supports    Share Information with NAME: Kevin Love     Permission granted to share info w Relationship: Wife  Permission granted to share info w Contact Information: [phone]  Emotional Assessment Appearance:: Appears stated age Attitude/Demeanor/Rapport: Engaged, Gracious Affect (typically observed): Accepting, Appropriate, Calm, Pleasant Orientation: : Oriented to Self, Oriented to Place, Oriented to  Time, Oriented to Situation Alcohol / Substance Use: Not Applicable Psych Involvement: No (comment)  Admission diagnosis:  CHF (congestive heart failure) (HCC) [I50.9] Fluid overload [E87.70] Gastrointestinal hemorrhage, unspecified gastrointestinal hemorrhage type [K92.2] Anemia, unspecified type [D64.9] Acute congestive heart failure, unspecified heart failure type [facility]) [I50.9] Patient Active Problem List   Diagnosis Date Noted  CHF (congestive heart failure) ([place]) 06/01/2022   Fluid overload 06/01/2022   CVA (cerebral vascular accident) ([place]) 02/12/2022   New onset atrial fibrillation ([place]) 02/12/2022   CML (chronic  myelocytic leukemia) ([place]) 08/05/2020   Leukocytosis 07/25/2020   Hypertension 07/25/2020   Parkinson disease ([place]) 07/25/2020   TIA (transient ischemic attack) 07/25/2020   PCP:  Baxter Hire, MD Pharmacy:   [id number] [place] [place] [address]Phone: [phone]: [phone] [place]Phone: [phone]: [phone] [address] [address]Phone: [phone]: [phone] [id number] [place]: [phone]: [phone]   Social Determinants of Health (SDOH) Interventions    Readmission Risk Interventions    06/03/2022   11:24 AM  Readmission Risk Prevention Plan  Transportation Screening Complete  PCP or Specialist Appt within 3-5 Days Complete  HRI or Home Care Consult Complete  Social Work Consult for [place]/Counseling Complete  Palliative Care Screening Not Applicable  Medication Review (RPress photographerComplete

## 2022-06-03 NOTE — Assessment & Plan Note (Signed)
Mild.  Congestive heart failure ruled out.  Minimally elevated BNP and following 1 dose of Lasix, has diuresed almost 1.5 L.

## 2022-06-03 NOTE — Assessment & Plan Note (Addendum)
Followed by cardiology.  Rate controlled.  Okay as per GI to resume Eliquis on discharge.  Continue amiodarone

## 2022-06-03 NOTE — Hospital Course (Addendum)
76 year old male with past medical history of Parkinson's disease, atrial fibrillation on Eliquis, hypertension and CML who presented to the emergency room on 8/14, after being sent over by his primary care physician's office, with increasing shortness of breath and lower extremity swelling.  Lab work in both office as well as in the emergency department noted a hemoglobin of 7.3, down from 10 3 weeks prior.  Gastroenterology consulted and patient went for EGD and colonoscopy on 8/16.  Scopes were essentially normal with the exception of one small AVM in colon which was cauterized and may have been source of bleeding.  Plan for video capsule on 8/17.  Patient had mildly elevated BNP on admission and given a dose of Lasix.  Echocardiogram unremarkable.

## 2022-06-04 ENCOUNTER — Encounter: Payer: Self-pay | Admitting: General Practice

## 2022-06-04 ENCOUNTER — Encounter: Payer: Self-pay | Admitting: Gastroenterology

## 2022-06-04 ENCOUNTER — Encounter
Admission: EM | Disposition: A | Discharge status: Home / Self Care | Payer: Self-pay | Source: Ambulatory Visit | Attending: Internal Medicine

## 2022-06-04 DIAGNOSIS — K922 Gastrointestinal hemorrhage, unspecified: Secondary | ICD-10-CM | POA: Diagnosis not present

## 2022-06-04 DIAGNOSIS — C9210 Chronic myeloid leukemia, BCR/ABL-positive, not having achieved remission: Secondary | ICD-10-CM | POA: Diagnosis not present

## 2022-06-04 DIAGNOSIS — I4891 Unspecified atrial fibrillation: Secondary | ICD-10-CM | POA: Diagnosis not present

## 2022-06-04 DIAGNOSIS — D62 Acute posthemorrhagic anemia: Secondary | ICD-10-CM | POA: Diagnosis not present

## 2022-06-04 HISTORY — PX: GIVENS CAPSULE STUDY: SHX5432

## 2022-06-04 LAB — BASIC METABOLIC PANEL
Anion gap: 6 (ref 5–15)
BUN: 18 mg/dL (ref 8–23)
CO2: 25 mmol/L (ref 22–32)
Calcium: 8.4 mg/dL — ABNORMAL LOW (ref 8.9–10.3)
Chloride: 108 mmol/L (ref 98–111)
Creatinine, Ser: 1.16 mg/dL (ref 0.61–1.24)
GFR, Estimated: >60 mL/min (ref >60)
Glucose, Bld: 113 mg/dL — ABNORMAL HIGH (ref 70–99)
Potassium: 4.1 mmol/L (ref 3.5–5.1)
Sodium: 139 mmol/L (ref 135–145)

## 2022-06-04 LAB — CBC
HCT: 27.8 % — ABNORMAL LOW (ref 39.0–52.0)
Hemoglobin: 8.8 g/dL — ABNORMAL LOW (ref 13.0–17.0)
MCH: 30.1 pg (ref 26.0–34.0)
MCHC: 31.7 g/dL (ref 30.0–36.0)
MCV: 95.2 fL (ref 80.0–100.0)
Platelets: 203 10*3/uL (ref 150–400)
RBC: 2.92 MIL/uL — ABNORMAL LOW (ref 4.22–5.81)
RDW: 16.1 % — ABNORMAL HIGH (ref 11.5–15.5)
WBC: 6.6 10*3/uL (ref 4.0–10.5)
nRBC: 0 % (ref 0.0–0.2)

## 2022-06-04 LAB — GLUCOSE, CAPILLARY
Glucose-Capillary: 111 mg/dL — ABNORMAL HIGH (ref 70–99)
Glucose-Capillary: 127 mg/dL — ABNORMAL HIGH (ref 70–99)

## 2022-06-04 LAB — MAGNESIUM: Magnesium: 2.7 mg/dL — ABNORMAL HIGH (ref 1.7–2.4)

## 2022-06-04 SURGERY — IMAGING PROCEDURE, GI TRACT, INTRALUMINAL, VIA CAPSULE

## 2022-06-04 MED ORDER — AMIODARONE HCL 200 MG PO TABS: ORAL_TABLET | ORAL | Status: DC

## 2022-06-04 MED ORDER — CARBIDOPA-LEVODOPA 25-100 MG PO TABS: 1.0000 | ORAL_TABLET | Freq: Every day | ORAL | 0 refills | Status: DC

## 2022-06-04 NOTE — Care Plan (Signed)
VCE placed today. Study to be completed this afternoon. Will be read in the next few days. Can f/u as an outpatient. Given high risk of stroke and no high risk lesions on upper or lower endoscopy, benefit of anticoagulation likely outweighs risks.  Raylene Miyamoto MD, MPH [place]

## 2022-06-04 NOTE — Anesthesia Postprocedure Evaluation (Signed)
Anesthesia Post Note  Patient: Kevin Love  Procedure(s) Performed: ESOPHAGOGASTRODUODENOSCOPY (EGD) WITH PROPOFOL COLONOSCOPY WITH PROPOFOL  Patient location during evaluation: PACU Anesthesia Type: General Level of consciousness: awake and alert Pain management: pain level controlled Vital Signs Assessment: post-procedure vital signs reviewed and stable Respiratory status: spontaneous breathing, nonlabored ventilation and respiratory function stable Cardiovascular status: blood pressure returned to baseline and stable Postop Assessment: no apparent nausea or vomiting Anesthetic complications: no   No notable events documented.   Last Vitals:  Vitals:   06/04/22 0311 06/04/22 0832  BP: (!) 128/47 (!) 147/50  Pulse: (!) 55 60  Resp: 18   Temp: 37.2 C 36.8 C  SpO2: 91%     Last Pain:  Vitals:   06/04/22 0832  TempSrc: Oral  PainSc:                  [place] Ouch

## 2022-06-04 NOTE — Progress Notes (Signed)
Mobility Specialist - Progress Note    06/04/22 1452  Mobility  Activity Ambulated independently in hallway;Stood at bedside;Dangled on edge of bed  Level of Assistance Independent  Assistive Device None  Distance Ambulated (ft) 100 ft  Activity Response Tolerated well  $Mobility charge 1 Mobility   Pt supine in bed on RA upon arrival. Pt STS and ambulates in hallway indep. Pt returns to bed with needs in reach and family in room.   Gretchen Short  Mobility Specialist  06/04/22 2:54 PM

## 2022-06-04 NOTE — TOC Transition Note (Signed)
Transition of Care [facility]) - CM/SW Discharge Note   Patient Details  Name: Kevin Love MRN: [id number] Date of Birth: 06/18/1946  Transition of Care [facility] CM/SW Contact:  Candie Chroman, LCSW Phone Number: 06/04/2022, 3:23 PM   Clinical Narrative: Patient has orders to discharge home today. He is not interested in home health at this time but will contact his PCP if he changes his mind later on. No further concerns. CSW signing off.    Final next level of care: Home/Self Care Barriers to Discharge: Barriers Resolved   Patient Goals and CMS Choice        Discharge Placement                Patient to be transferred to facility by: Wife Name of family member notified: Bernarr Longsworth Patient and family notified of of transfer: 06/04/22  Discharge Plan and Services     Post Acute Care Choice: NA                               Social Determinants of Health (SDOH) Interventions     Readmission Risk Interventions    06/03/2022   11:24 AM  Readmission Risk Prevention Plan  Transportation Screening Complete  PCP or Specialist Appt within 3-5 Days Complete  HRI or [place] Complete  Social Work Consult for [place] Planning/Counseling Complete  Palliative Care Screening Not Applicable  Medication Review [employer]) Complete

## 2022-06-04 NOTE — Care Management Important Message (Signed)
Important Message  Patient Details  Name: Kevin Love MRN: [id number] Date of Birth: [birth year]   Medicare Important Message Given:  Yes     Dannette Barbara 06/04/2022, 11:38 AM

## 2022-06-04 NOTE — Discharge Summary (Signed)
Physician Discharge Summary   Patient: Kevin Love MRN: [id number] DOB: 04/18/1946  Admit date:     06/01/2022  Discharge date: 06/04/22  Discharge Physician: Annita Brod   PCP: Baxter Hire, MD   Recommendations at discharge:   Okay for patient to resume Eliquis on discharge Medication clarification: As per prior to hospitalization, patient's amiodarone through cardiology has been weaned down from 400 mg to 200 mg which he will continue until 8/31 and then stop altogether. Medication clarification: As per prior to hospitalization, patient's Sinemet has been weaned down from twice daily to once a day starting 8/17 and then stop altogether on 8/23. Patient will follow-up with gastroenterology for results of capsule endoscopy.  Discharge Diagnoses: Principal Problem:   Acute blood loss anemia Active Problems:   New onset atrial fibrillation (HCC)   Fluid overload   Parkinson disease (HCC)   CML (chronic myelocytic leukemia) (HCC)   Hypertension   Overweight (BMI 25.0-29.9)  Resolved Problems:   * No resolved hospital problems. *  Hospital Course: 76 year old male with past medical history of Parkinson's disease, atrial fibrillation on Eliquis, hypertension and CML who presented to the emergency room on 8/14, after being sent over by his primary care physician's office, with increasing shortness of breath and lower extremity swelling.  Lab work in both office as well as in the emergency department noted a hemoglobin of 7.3, down from 10 3 weeks prior.  Gastroenterology consulted and patient went for EGD and colonoscopy on 8/16.  Scopes were essentially normal with the exception of one small AVM in colon which was cauterized and may have been source of bleeding.  Plan for video capsule on 8/17.  Patient had mildly elevated BNP on admission and given a dose of Lasix.  Echocardiogram unremarkable.  Assessment and Plan: * Acute blood loss anemia Presumed GI source.  Small area  of AVM noted on colon.  No active or large obvious bleeding source identified.  Status post EGD and colonoscopy.  Plan is for video capsule on 8/17.  Eliquis held, but okay to resume as per GI upon discharge.  Status post 1 unit packed red blood cell transfusion  New onset atrial fibrillation ([place]) Followed by cardiology.  Rate controlled.  Okay as per GI to resume Eliquis on discharge.  Continue amiodarone for 2 more weeks, but it is being weaned off as per cardiology by the end of August.  Fluid overload Mild.  Congestive heart failure ruled out.  Minimally elevated BNP and following 1 dose of Lasix, has diuresed almost 1.5 L.  Parkinson disease ([place]) According to patient's wife, patient was previously diagnosed with Parkinson's disease [place].  [place] here, current PCP has felt that patient does not have Parkinson's.  Sinemet is being weaned off and on day of discharge, had gone from twice daily to daily and stop as of 8/23.  Will defer to PCP for formal removal of this diagnosis of his past medical history.  CML (chronic myelocytic leukemia) ([place]) Patient will follow-up with oncology next week, chemo to resume  Overweight (BMI 25.0-29.9) Meets criteria with BMI greater than 25         Consultants: Gastroenterology Cardiology   Procedures: Status post endoscopy Status post colonoscopy Transfusion 1 unit packed red blood cells Capsule endoscopy started  Disposition: Home Diet recommendation:  Cardiac diet DISCHARGE MEDICATION: Allergies as of 06/04/2022       Reactions   Penicillins Rash  Medication List     TAKE these medications    acetaminophen 500 MG tablet Commonly known as: TYLENOL Take 1,000 mg by mouth every 6 (six) hours as needed for moderate pain.   amiodarone 200 MG tablet Commonly known as: PACERONE Take 1 pill (200 mg) once a day and stop taking medicine after 8/31 What changed:  medication strength how  much to take how to take this when to take this additional instructions   apixaban 5 MG Tabs tablet Commonly known as: ELIQUIS Take 1 tablet (5 mg total) by mouth 2 (two) times daily.   atorvastatin 80 MG tablet Commonly known as: LIPITOR Take 1 tablet (80 mg total) by mouth daily.   carbidopa-levodopa 25-100 MG tablet Commonly known as: SINEMET IR Take 1 tablet by mouth daily for 6 days. What changed: when to take this   CENTRUM SILVER 50+MEN PO Take 1 tablet by mouth daily.   clonazePAM 0.5 MG tablet Commonly known as: KLONOPIN Take 0.5 mg by mouth daily.   cyanocobalamin 1000 MCG tablet Take 1,000 mcg by mouth daily.   dasatinib 20 MG tablet Commonly known as: Sprycel Take 1 tablet (20 mg total) by mouth daily.   furosemide 20 MG tablet Commonly known as: LASIX Take 1 tablet (20 mg total) by mouth daily as needed (take daily in the morning as needed for ankle swelling).   latanoprost 0.005 % ophthalmic solution Commonly known as: XALATAN Place 1 drop into the right eye at bedtime.   lisinopril 10 MG tablet Commonly known as: ZESTRIL Take 10 mg by mouth daily.   memantine 10 MG tablet Commonly known as: NAMENDA Take 10 mg by mouth 2 (two) times daily.   tamsulosin 0.4 MG Caps capsule Commonly known as: FLOMAX Take 0.4 mg by mouth daily.        Follow-up Information     Paraschos, Alexander, MD. Go in 1 week(s).   Specialty: Cardiology Why: Appointment on Tuesday, 07/07/2022 at 2:45pm. You have been added to the cancellation list for an earlier appointment. Contact information: [employer] [facility] [place] [address] [phone]                Discharge Exam: Danley Danker Weights   06/01/22 1624 06/03/22 0331 06/04/22 0449  Weight: 77.1 kg 71.4 kg 70.7 kg   General: Alert and oriented x2, no acute distress Cardiovascular: Regular rate and rhythm, S1-S2 Lungs: Clear to auscultation bilaterally  Condition at  discharge: good  The results of significant diagnostics from this hospitalization (including imaging, microbiology, ancillary and laboratory) are listed below for reference.   Imaging Studies: ECHOCARDIOGRAM COMPLETE  Result Date: 06/02/2022    ECHOCARDIOGRAM REPORT   Patient Name:   Kevin Love Date of Exam: 06/02/2022 Medical Rec #:  [id number]    Height:       64.0 in Accession #:    [id number]   Weight:       170.0 lb Date of Birth:  April 11, 1946    BSA:          1.826 m Patient Age:    76 years     BP:           152/65 mmHg Patient Gender: M            HR:           62 bpm. Exam Location:  [facility] Procedure: 2D Echo, Cardiac Doppler and Color Doppler Indications:     I50.31 CHF Acute Diastolic  History:         Patient has no prior history of Echocardiogram examinations.                  CHF, CML, Arrythmias:Atrial Fibrillation; Risk Factors:Former                  Smoker and Hypertension.  Sonographer:     Rosalia Hammers Referring Phys:  [id number] SARA-MAIZ A THOMAS Diagnosing Phys: Isaias Cowman MD  Sonographer Comments: Suboptimal parasternal window. Image acquisition challenging due to respiratory motion. IMPRESSIONS  1. Left ventricular ejection fraction, by estimation, is 60 to 65%. The left ventricle has normal function. The left ventricle has no regional wall motion abnormalities. Left ventricular diastolic parameters were normal.  2. Right ventricular systolic function is normal. The right ventricular size is normal.  3. The mitral valve is normal in structure. Mild to moderate mitral valve regurgitation. No evidence of mitral stenosis.  4. Tricuspid valve regurgitation is mild to moderate.  5. The aortic valve is normal in structure. Aortic valve regurgitation is mild. No aortic stenosis is present.  6. The inferior vena cava is normal in size with greater than 50% respiratory variability, suggesting right atrial pressure of 3 mmHg. FINDINGS  Left Ventricle: Left ventricular ejection fraction, by  estimation, is 60 to 65%. The left ventricle has normal function. The left ventricle has no regional wall motion abnormalities. The left ventricular internal cavity size was normal in size. There is  no left ventricular hypertrophy. Left ventricular diastolic parameters were normal. Right Ventricle: The right ventricular size is normal. No increase in right ventricular wall thickness. Right ventricular systolic function is normal. Left Atrium: Left atrial size was normal in size. Right Atrium: Right atrial size was normal in size. Pericardium: There is no evidence of pericardial effusion. Mitral Valve: The mitral valve is normal in structure. Mild to moderate mitral valve regurgitation. No evidence of mitral valve stenosis. Tricuspid Valve: The tricuspid valve is normal in structure. Tricuspid valve regurgitation is mild to moderate. No evidence of tricuspid stenosis. Aortic Valve: The aortic valve is normal in structure. Aortic valve regurgitation is mild. No aortic stenosis is present. Aortic valve mean gradient measures 6.0 mmHg. Aortic valve peak gradient measures 10.5 mmHg. Aortic valve area, by VTI measures 2.10  cm. Pulmonic Valve: The pulmonic valve was normal in structure. Pulmonic valve regurgitation is not visualized. No evidence of pulmonic stenosis. Aorta: The aortic root is normal in size and structure. Venous: The inferior vena cava is normal in size with greater than 50% respiratory variability, suggesting right atrial pressure of 3 mmHg. IAS/Shunts: No atrial level shunt detected by color flow Doppler.  LEFT VENTRICLE PLAX 2D LVIDd:         4.94 cm   Diastology LVIDs:         2.89 cm   LV e' medial:    6.74 cm/s LV PW:         1.37 cm   LV E/e' medial:  19.3 LV IVS:        1.22 cm   LV e' lateral:   7.40 cm/s LVOT diam:     2.00 cm   LV E/e' lateral: 17.6 LV SV:         87 LV SV Index:   47 LVOT Area:     3.14 cm  RIGHT VENTRICLE RV Basal diam:  2.94 cm RV Mid diam:    4.13 cm RV S prime:  14.50 cm/s TAPSE (M-mode): 3.4 cm LEFT ATRIUM             Index        RIGHT ATRIUM           Index LA diam:        4.40 cm 2.41 cm/m   RA Area:     19.50 cm LA Vol (A2C):   89.9 ml 49.24 ml/m  RA Volume:   53.10 ml  29.08 ml/m LA Vol (A4C):   79.1 ml 43.32 ml/m LA Biplane Vol: 84.9 ml 46.50 ml/m  AORTIC VALVE AV Area (Vmax):    2.13 cm AV Area (Vmean):   1.99 cm AV Area (VTI):     2.10 cm AV Vmax:           162.00 cm/s AV Vmean:          114.000 cm/s AV VTI:            0.413 m AV Peak Grad:      10.5 mmHg AV Mean Grad:      6.0 mmHg LVOT Vmax:         110.00 cm/s LVOT Vmean:        72.300 cm/s LVOT VTI:          0.276 m LVOT/AV VTI ratio: 0.67  AORTA Ao Root diam: 3.60 cm MITRAL VALVE                TRICUSPID VALVE MV Area (PHT): 4.15 cm     TR Peak grad:   39.9 mmHg MV Decel Time: 183 msec     TR Vmax:        316.00 cm/s MV E velocity: 130.00 cm/s MV A velocity: 101.00 cm/s  SHUNTS MV E/A ratio:  1.29         Systemic VTI:  0.28 m                             Systemic Diam: 2.00 cm Isaias Cowman MD Electronically signed by Isaias Cowman MD Signature Date/Time: 06/02/2022/10:37:57 AM    Final    DG Chest 2 View  Result Date: 06/01/2022 CLINICAL DATA:  Dyspnea and leg swelling EXAM: CHEST - 2 VIEW COMPARISON:  None Available. FINDINGS: Mild cardiomegaly. Pulmonary vascular congestion. Small right possible trace left pleural effusions and associated basilar atelectasis. No pneumothorax. No acute osseous abnormality. Aortic atherosclerotic calcification. IMPRESSION: Cardiomegaly and pulmonary vascular congestion. Small right and trace left pleural effusions. Aortic Atherosclerosis (ICD10-I70.0). Electronically Signed   By: Placido Sou M.D.   On: 06/01/2022 17:06    Microbiology: Results for orders placed or performed during the hospital encounter of 02/12/22  Resp Panel by RT-PCR (Flu A&B, Covid) Nasopharyngeal Swab     Status: None   Collection Time: 02/12/22 11:18 AM   Specimen:  Nasopharyngeal Swab; Nasopharyngeal(NP) swabs in vial transport medium  Result Value Ref Range Status   SARS Coronavirus 2 by RT PCR NEGATIVE NEGATIVE Final    Comment: (NOTE) SARS-CoV-2 target nucleic acids are NOT DETECTED.  The SARS-CoV-2 RNA is generally detectable in upper respiratory specimens during the acute phase of infection. The lowest concentration of SARS-CoV-2 viral copies this assay can detect is 138 copies/mL. A negative result does not preclude SARS-Cov-2 infection and should not be used as the sole basis for treatment or other patient management decisions. A negative result may occur with  improper specimen collection/handling, submission of specimen  other than nasopharyngeal swab, presence of viral mutation(s) within the areas targeted by this assay, and inadequate number of viral copies(<138 copies/mL). A negative result must be combined with clinical observations, patient history, and epidemiological information. The expected result is Negative.  Fact Sheet for Patients:  EntrepreneurPulse.com.au  Fact Sheet for Healthcare Providers:  IncredibleEmployment.be  This test is no t yet approved or cleared by the [place] FDA and  has been authorized for detection and/or diagnosis of SARS-CoV-2 by FDA under an Emergency Use Authorization (EUA). This EUA will remain  in effect (meaning this test can be used) for the duration of the COVID-19 declaration under Section 564(b)(1) of the Act, 21 U.S.C.section 360bbb-3(b)(1), unless the authorization is terminated  or revoked sooner.       Influenza A by PCR NEGATIVE NEGATIVE Final   Influenza B by PCR NEGATIVE NEGATIVE Final    Comment: (NOTE) The Xpert Xpress SARS-CoV-2/FLU/RSV plus assay is intended as an aid in the diagnosis of influenza from Nasopharyngeal swab specimens and should not be used as a sole basis for treatment. Nasal washings and aspirates are unacceptable for  Xpert Xpress SARS-CoV-2/FLU/RSV testing.  Fact Sheet for Patients: EntrepreneurPulse.com.au  Fact Sheet for Healthcare Providers: IncredibleEmployment.be  This test is not yet approved or cleared by the [place] FDA and has been authorized for detection and/or diagnosis of SARS-CoV-2 by FDA under an Emergency Use Authorization (EUA). This EUA will remain in effect (meaning this test can be used) for the duration of the COVID-19 declaration under Section 564(b)(1) of the Act, 21 U.S.C. section 360bbb-3(b)(1), unless the authorization is terminated or revoked.  Performed at [facility], [address] [place], [place] [address]     Labs: CBC: Recent Labs  Lab 06/01/22 1636 06/02/22 0107 06/02/22 0827 06/02/22 1712 06/03/22 0453 06/04/22 1332  WBC 6.8  --  6.1  --  6.0 6.6  NEUTROABS 4.5  --   --   --   --   --   HGB 7.3* 6.7* 8.6* 8.4* 7.8* 8.8*  HCT 23.8*  --  27.6* 26.8* 24.6* 27.8*  MCV 101.3*  --  96.8  --  94.6 95.2  PLT 222  --  205  --  213 676   Basic Metabolic Panel: Recent Labs  Lab 06/01/22 1636 06/02/22 0827 06/03/22 0453 06/04/22 1332  NA 140 141 143 139  K 4.5 3.8 3.9 4.1  CL 111 111 112* 108  CO2 '24 26 27 25  '$ GLUCOSE 149* 122* 109* 113*  BUN 25* '21 19 18  '$ CREATININE 1.25* 1.20 1.18 1.16  CALCIUM 8.6* 8.6* 8.1* 8.4*  MG  --   --  0.8* 2.7*   Liver Function Tests: Recent Labs  Lab 06/01/22 1856 06/02/22 0827  AST 23 25  ALT 30 35  ALKPHOS 108 120  BILITOT 0.5 0.9  PROT 6.3* 6.6  ALBUMIN 3.4* 3.5   CBG: Recent Labs  Lab 06/02/22 0850 06/04/22 0820 06/04/22 1215  GLUCAP 125* 111* 127*    Discharge time spent: less than 30 minutes.  Signed: Annita Brod, MD [facility] 06/04/2022

## 2022-06-05 ENCOUNTER — Encounter: Payer: Self-pay | Admitting: Gastroenterology

## 2022-06-05 LAB — SURGICAL PATHOLOGY

## 2022-06-10 ENCOUNTER — Inpatient Hospital Stay: Payer: Medicare Other | Attending: Hematology | Admitting: Hematology

## 2022-06-10 ENCOUNTER — Inpatient Hospital Stay: Payer: Medicare Other

## 2022-06-10 VITALS — BP 144/67 | HR 61 | Temp 97.6°F | Resp 18 | Ht 64.0 in | Wt 158.6 lb

## 2022-06-10 DIAGNOSIS — C9210 Chronic myeloid leukemia, BCR/ABL-positive, not having achieved remission: Secondary | ICD-10-CM

## 2022-06-10 DIAGNOSIS — Z8673 Personal history of transient ischemic attack (TIA), and cerebral infarction without residual deficits: Secondary | ICD-10-CM | POA: Insufficient documentation

## 2022-06-10 DIAGNOSIS — Z809 Family history of malignant neoplasm, unspecified: Secondary | ICD-10-CM | POA: Diagnosis not present

## 2022-06-10 DIAGNOSIS — R39198 Other difficulties with micturition: Secondary | ICD-10-CM | POA: Diagnosis not present

## 2022-06-10 DIAGNOSIS — I4891 Unspecified atrial fibrillation: Secondary | ICD-10-CM | POA: Insufficient documentation

## 2022-06-10 DIAGNOSIS — Z87891 Personal history of nicotine dependence: Secondary | ICD-10-CM | POA: Insufficient documentation

## 2022-06-10 DIAGNOSIS — Z7901 Long term (current) use of anticoagulants: Secondary | ICD-10-CM | POA: Diagnosis not present

## 2022-06-10 DIAGNOSIS — R413 Other amnesia: Secondary | ICD-10-CM | POA: Insufficient documentation

## 2022-06-10 DIAGNOSIS — G20 Parkinson's disease: Secondary | ICD-10-CM | POA: Insufficient documentation

## 2022-06-10 DIAGNOSIS — Z79899 Other long term (current) drug therapy: Secondary | ICD-10-CM | POA: Insufficient documentation

## 2022-06-10 LAB — CBC WITH DIFFERENTIAL/PLATELET
Abs Immature Granulocytes: 0.02 10*3/uL (ref 0.00–0.07)
Basophils Absolute: 0 10*3/uL (ref 0.0–0.1)
Basophils Relative: 1 %
Eosinophils Absolute: 0.1 10*3/uL (ref 0.0–0.5)
Eosinophils Relative: 2 %
HCT: 30.7 % — ABNORMAL LOW (ref 39.0–52.0)
Hemoglobin: 9.6 g/dL — ABNORMAL LOW (ref 13.0–17.0)
Immature Granulocytes: 0 %
Lymphocytes Relative: 22 %
Lymphs Abs: 1.1 10*3/uL (ref 0.7–4.0)
MCH: 30.5 pg (ref 26.0–34.0)
MCHC: 31.3 g/dL (ref 30.0–36.0)
MCV: 97.5 fL (ref 80.0–100.0)
Monocytes Absolute: 0.5 10*3/uL (ref 0.1–1.0)
Monocytes Relative: 11 %
Neutro Abs: 3.3 10*3/uL (ref 1.7–7.7)
Neutrophils Relative %: 64 %
Platelets: 212 10*3/uL (ref 150–400)
RBC: 3.15 MIL/uL — ABNORMAL LOW (ref 4.22–5.81)
RDW: 15.4 % (ref 11.5–15.5)
WBC: 5.1 10*3/uL (ref 4.0–10.5)
nRBC: 0 % (ref 0.0–0.2)

## 2022-06-10 LAB — IRON AND TIBC
Iron: 37 ug/dL — ABNORMAL LOW (ref 45–182)
Saturation Ratios: 15 % — ABNORMAL LOW (ref 17.9–39.5)
TIBC: 248 ug/dL — ABNORMAL LOW (ref 250–450)
UIBC: 211 ug/dL

## 2022-06-10 LAB — RETICULOCYTES
Immature Retic Fract: 21 % — ABNORMAL HIGH (ref 2.3–15.9)
RBC.: 3.12 MIL/uL — ABNORMAL LOW (ref 4.22–5.81)
Retic Count, Absolute: 77.4 10*3/uL (ref 19.0–186.0)
Retic Ct Pct: 2.5 % (ref 0.4–3.1)

## 2022-06-10 LAB — COMPREHENSIVE METABOLIC PANEL
ALT: 7 U/L (ref 0–44)
AST: 22 U/L (ref 15–41)
Albumin: 3.5 g/dL (ref 3.5–5.0)
Alkaline Phosphatase: 123 U/L (ref 38–126)
Anion gap: 5 (ref 5–15)
BUN: 18 mg/dL (ref 8–23)
CO2: 25 mmol/L (ref 22–32)
Calcium: 8.9 mg/dL (ref 8.9–10.3)
Chloride: 111 mmol/L (ref 98–111)
Creatinine, Ser: 1.26 mg/dL — ABNORMAL HIGH (ref 0.61–1.24)
GFR, Estimated: 59 mL/min — ABNORMAL LOW (ref >60)
Glucose, Bld: 135 mg/dL — ABNORMAL HIGH (ref 70–99)
Potassium: 4.3 mmol/L (ref 3.5–5.1)
Sodium: 141 mmol/L (ref 135–145)
Total Bilirubin: 0.8 mg/dL (ref 0.3–1.2)
Total Protein: 6.6 g/dL (ref 6.5–8.1)

## 2022-06-10 LAB — LACTATE DEHYDROGENASE: LDH: 136 U/L (ref 98–192)

## 2022-06-10 LAB — FERRITIN: Ferritin: 64 ng/mL (ref 24–336)

## 2022-06-10 NOTE — Patient Instructions (Addendum)
[place] at [facility] Discharge Instructions  You were seen and examined today by Dr. Delton Coombes.  Dr. Delton Coombes discussed your most recent lab work and it shows that your hemoglobin has improved slightly. Dr. Delton Coombes will call if your other labs are abnormal.   Dr. Delton Coombes wants to recheck your labs to see if your hemoglobin has improved.  Follow-up as scheduled in 6 weeks.    Thank you for choosing [place] at [facility] to provide your oncology and hematology care.  To afford each patient quality time with our provider, please arrive at least 15 minutes before your scheduled appointment time.   If you have a lab appointment with the [facility] please come in thru the Main Entrance and check in at the main information desk.  You need to re-schedule your appointment should you arrive 10 or more minutes late.  We strive to give you quality time with our providers, and arriving late affects you and other patients whose appointments are after yours.  Also, if you no show three or more times for appointments you may be dismissed from the clinic at the providers discretion.     Again, thank you for choosing [facility].  Our hope is that these requests will decrease the amount of time that you wait before being seen by our physicians.       _____________________________________________________________  Should you have questions after your visit to [facility], please contact our office at [phone] and follow the prompts.  Our office hours are 8:00 a.m. and 4:30 p.m. Monday - Friday.  Please note that voicemails left after 4:00 p.m. may not be returned until the following business day.  We are closed weekends and major holidays.  You do have access to a nurse 24-7, just call the main number to the clinic [phone] and do not press any options, hold on the line and a nurse will answer the phone.    For  prescription refill requests, have your pharmacy contact our office and allow 72 hours.

## 2022-06-10 NOTE — Progress Notes (Signed)
[place], [place] [address]   CLINIC:  Medical Oncology/Hematology  PCP:  Baxter Hire, MD [address] / [place] [address]  [phone]  REASON FOR VISIT:  Follow-up for CML  PRIOR THERAPY: none  CURRENT THERAPY: Sprycel 20 mg daily  INTERVAL HISTORY:  [name], a 76 y.o. 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  He was recently hospitalized from 06/01/2022 through 06/04/2022 with a GI bleed.  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  He also had capsule study which was completed on 06/09/2022.  He has been off of amiodarone, carbidopa.  He is continuing Eliquis.  He is also continuing Sprycel 20 mg daily.  REVIEW OF SYSTEMS:  Review of Systems  Gastrointestinal:  Positive for constipation.  All other systems reviewed and are negative.   PAST MEDICAL/SURGICAL HISTORY:  Past Medical History:  Diagnosis Date   Anemia    Atrial fibrillation ([place])    on Eliquis   Chronic Leukemia    Glaucoma    Parkinson disease ([place]) 07/25/2020   Stroke [facility])    Past Surgical History:  Procedure Laterality Date   BACK SURGERY     COLONOSCOPY WITH PROPOFOL N/A 06/03/2022   Procedure: COLONOSCOPY WITH PROPOFOL;  Surgeon: Lesly Rubenstein, MD;  Location: [facility] ENDOSCOPY;  Service: Endoscopy;  Laterality: N/A;   ESOPHAGOGASTRODUODENOSCOPY (EGD) WITH PROPOFOL N/A 06/03/2022   Procedure: ESOPHAGOGASTRODUODENOSCOPY (EGD) WITH PROPOFOL;  Surgeon: Lesly Rubenstein, MD;  Location: [facility] ENDOSCOPY;  Service: Endoscopy;  Laterality: N/A;   GIVENS CAPSULE STUDY N/A 06/04/2022   Procedure: GIVENS CAPSULE STUDY;  Surgeon: Lesly Rubenstein, MD;  Location: [facility] ENDOSCOPY;  Service: Endoscopy;  Laterality: N/A;   toe removed Right     SOCIAL HISTORY:  Social History   Socioeconomic History   Marital status: Married    Spouse name: Not on file   Number of children: Not on file   Years of education: Not on file    Highest education level: Not on file  Occupational History   Not on file  Tobacco Use   Smoking status: Former    Types: Cigarettes   Smokeless tobacco: Never  Substance and Sexual Activity   Alcohol use: Never   Drug use: Never   Sexual activity: Not Currently  Other Topics Concern   Not on file  Social History Narrative   Not on file   Social Determinants of Health   Financial Resource Strain: Low Risk  (08/22/2020)   Overall Financial Resource Strain (CARDIA)    Difficulty of Paying Living Expenses: Not hard at all  Food Insecurity: No Food Insecurity (08/22/2020)   Hunger Vital Sign    Worried About Running Out of Food in the Last Year: Never true    [place] in the Last Year: Never true  Transportation Needs: No Transportation Needs (08/22/2020)   PRAPARE - Hydrologist (Medical): No    Lack of Transportation (Non-Medical): No  Physical Activity: Insufficiently Active (08/22/2020)   Exercise Vital Sign    Days of Exercise per Week: 7 days    Minutes of Exercise per Session: 10 min  Stress: No Stress Concern Present (08/22/2020)   [place]    Feeling of Stress : Not at all  Social Connections: Moderately Integrated (08/22/2020)   Social Connection and Isolation Panel [NHANES]  Frequency of Communication with Friends and Family: Three times a week    Frequency of Social Gatherings with Friends and Family: Once a week    Attends Religious Services: More than 4 times per year    Active Member of [employer] or Organizations: No    Attends Archivist Meetings: Never    Marital Status: Married  Human resources officer Violence: Not At Risk (08/22/2020)   Humiliation, Afraid, Rape, and Kick questionnaire    Fear of Current or Ex-Partner: No    Emotionally Abused: No    Physically Abused: No    Sexually Abused: No    FAMILY HISTORY:  No family history on file.  CURRENT  MEDICATIONS:  Current Outpatient Medications  Medication Sig Dispense Refill   acetaminophen (TYLENOL) 500 MG tablet Take 1,000 mg by mouth every 6 (six) hours as needed for moderate pain.     amiodarone (PACERONE) 200 MG tablet Take 1 pill (200 mg) once a day and stop taking medicine after 8/31     apixaban (ELIQUIS) 5 MG TABS tablet Take 1 tablet (5 mg total) by mouth 2 (two) times daily. 60 tablet 0   atorvastatin (LIPITOR) 80 MG tablet Take 1 tablet (80 mg total) by mouth daily. 30 tablet 0   carbidopa-levodopa (SINEMET IR) 25-100 MG tablet Take 1 tablet by mouth daily for 6 days. 6 tablet 0   clonazePAM (KLONOPIN) 0.5 MG tablet Take 0.5 mg by mouth daily.     cyanocobalamin 1000 MCG tablet Take 1,000 mcg by mouth daily.     dasatinib (SPRYCEL) 20 MG tablet Take 1 tablet (20 mg total) by mouth daily. 30 tablet 11   furosemide (LASIX) 20 MG tablet Take 1 tablet (20 mg total) by mouth daily as needed (take daily in the morning as needed for ankle swelling). 30 tablet 1   latanoprost (XALATAN) 0.005 % ophthalmic solution Place 1 drop into the right eye at bedtime.     lisinopril (ZESTRIL) 10 MG tablet Take 10 mg by mouth daily.     memantine (NAMENDA) 10 MG tablet Take 10 mg by mouth 2 (two) times daily.     Multiple Vitamins-Minerals (CENTRUM SILVER 50+MEN PO) Take 1 tablet by mouth daily.     tamsulosin (FLOMAX) 0.4 MG CAPS capsule Take 0.4 mg by mouth daily.     No current facility-administered medications for this visit.    ALLERGIES:  Allergies  Allergen Reactions   Penicillins Rash    PHYSICAL EXAM:  Performance status (ECOG): 1 - Symptomatic but completely ambulatory  Vitals:   06/10/22 1445  BP: (!) 144/67  Pulse: 61  Resp: 18  Temp: 97.6 F (36.4 C)  SpO2: 94%   Wt Readings from Last 3 Encounters:  06/10/22 158 lb 9.6 oz (71.9 kg)  06/04/22 155 lb 13.8 oz (70.7 kg)  05/12/22 165 lb 9.6 oz (75.1 kg)   Physical Exam Vitals reviewed.  Constitutional:       Appearance: Normal appearance.  Cardiovascular:     Rate and Rhythm: Normal rate and regular rhythm.     Pulses: Normal pulses.     Heart sounds: Normal heart sounds.  Pulmonary:     Effort: Pulmonary effort is normal.     Breath sounds: Normal breath sounds.  Abdominal:     Palpations: Abdomen is soft. There is no hepatomegaly, splenomegaly or mass.     Tenderness: There is no abdominal tenderness.  Musculoskeletal:     Right lower leg: 1+ Edema present.  Left lower leg: 1+ Edema present.  Neurological:     General: No focal deficit present.     Mental Status: He is alert and oriented to person, place, and time.  Psychiatric:        Mood and Affect: Mood normal.        Behavior: Behavior normal.     LABORATORY DATA:  I have reviewed the labs as listed.     Latest Ref Rng & Units 06/10/2022    2:14 PM 06/04/2022    1:32 PM 06/03/2022    4:53 AM  CBC  WBC 4.0 - 10.5 K/uL 5.1  6.6  6.0   Hemoglobin 13.0 - 17.0 g/dL 9.6  8.8  7.8   Hematocrit 39.0 - 52.0 % 30.7  27.8  24.6   Platelets 150 - 400 K/uL 212  203  213       Latest Ref Rng & Units 06/10/2022    2:13 PM 06/04/2022    1:32 PM 06/03/2022    4:53 AM  CMP  Glucose 70 - 99 mg/dL 135  113  109   BUN 8 - 23 mg/dL 18  18  19    Creatinine 0.61 - 1.24 mg/dL 1.26  1.16  1.18   Sodium 135 - 145 mmol/L 141  139  143   Potassium 3.5 - 5.1 mmol/L 4.3  4.1  3.9   Chloride 98 - 111 mmol/L 111  108  112   CO2 22 - 32 mmol/L 25  25  27    Calcium 8.9 - 10.3 mg/dL 8.9  8.4  8.1   Total Protein 6.5 - 8.1 g/dL 6.6     Total Bilirubin 0.3 - 1.2 mg/dL 0.8     Alkaline Phos 38 - 126 U/L 123     AST 15 - 41 U/L 22     ALT 0 - 44 U/L 7         Component Value Date/Time   RBC 3.12 (L) 06/10/2022 1414   RBC 3.15 (L) 06/10/2022 1414   MCV 97.5 06/10/2022 1414   MCH 30.5 06/10/2022 1414   MCHC 31.3 06/10/2022 1414   RDW 15.4 06/10/2022 1414   LYMPHSABS 1.1 06/10/2022 1414   MONOABS 0.5 06/10/2022 1414   EOSABS 0.1 06/10/2022  1414   BASOSABS 0.0 06/10/2022 1414    DIAGNOSTIC IMAGING:  I have independently reviewed the scans and discussed with the patient. ECHOCARDIOGRAM COMPLETE  Result Date: 06/02/2022    ECHOCARDIOGRAM REPORT   Patient Name:   BARUCH LEWERS Date of Exam: 06/02/2022 Medical Rec #:  [id number]    Height:       64.0 in Accession #:    [id number]   Weight:       170.0 lb Date of Birth:  11/05/1945    BSA:          1.826 m Patient Age:    39 years     BP:           152/65 mmHg Patient Gender: M            HR:           62 bpm. Exam Location:  [facility] Procedure: 2D Echo, Cardiac Doppler and Color Doppler Indications:     I50.31 CHF Acute Diastolic  History:         Patient has no prior history of Echocardiogram examinations.                  CHF,  CML, Arrythmias:Atrial Fibrillation; Risk Factors:Former                  Smoker and Hypertension.  Sonographer:     Rosalia Hammers Referring Phys:  [id number] SARA-MAIZ A THOMAS Diagnosing Phys: Isaias Cowman MD  Sonographer Comments: Suboptimal parasternal window. Image acquisition challenging due to respiratory motion. IMPRESSIONS  1. Left ventricular ejection fraction, by estimation, is 60 to 65%. The left ventricle has normal function. The left ventricle has no regional wall motion abnormalities. Left ventricular diastolic parameters were normal.  2. Right ventricular systolic function is normal. The right ventricular size is normal.  3. The mitral valve is normal in structure. Mild to moderate mitral valve regurgitation. No evidence of mitral stenosis.  4. Tricuspid valve regurgitation is mild to moderate.  5. The aortic valve is normal in structure. Aortic valve regurgitation is mild. No aortic stenosis is present.  6. The inferior vena cava is normal in size with greater than 50% respiratory variability, suggesting right atrial pressure of 3 mmHg. FINDINGS  Left Ventricle: Left ventricular ejection fraction, by estimation, is 60 to 65%. The left ventricle has normal  function. The left ventricle has no regional wall motion abnormalities. The left ventricular internal cavity size was normal in size. There is  no left ventricular hypertrophy. Left ventricular diastolic parameters were normal. Right Ventricle: The right ventricular size is normal. No increase in right ventricular wall thickness. Right ventricular systolic function is normal. Left Atrium: Left atrial size was normal in size. Right Atrium: Right atrial size was normal in size. Pericardium: There is no evidence of pericardial effusion. Mitral Valve: The mitral valve is normal in structure. Mild to moderate mitral valve regurgitation. No evidence of mitral valve stenosis. Tricuspid Valve: The tricuspid valve is normal in structure. Tricuspid valve regurgitation is mild to moderate. No evidence of tricuspid stenosis. Aortic Valve: The aortic valve is normal in structure. Aortic valve regurgitation is mild. No aortic stenosis is present. Aortic valve mean gradient measures 6.0 mmHg. Aortic valve peak gradient measures 10.5 mmHg. Aortic valve area, by VTI measures 2.10  cm. Pulmonic Valve: The pulmonic valve was normal in structure. Pulmonic valve regurgitation is not visualized. No evidence of pulmonic stenosis. Aorta: The aortic root is normal in size and structure. Venous: The inferior vena cava is normal in size with greater than 50% respiratory variability, suggesting right atrial pressure of 3 mmHg. IAS/Shunts: No atrial level shunt detected by color flow Doppler.  LEFT VENTRICLE PLAX 2D LVIDd:         4.94 cm   Diastology LVIDs:         2.89 cm   LV e' medial:    6.74 cm/s LV PW:         1.37 cm   LV E/e' medial:  19.3 LV IVS:        1.22 cm   LV e' lateral:   7.40 cm/s LVOT diam:     2.00 cm   LV E/e' lateral: 17.6 LV SV:         87 LV SV Index:   47 LVOT Area:     3.14 cm  RIGHT VENTRICLE RV Basal diam:  2.94 cm RV Mid diam:    4.13 cm RV S prime:     14.50 cm/s TAPSE (M-mode): 3.4 cm LEFT ATRIUM              Index        RIGHT ATRIUM  Index LA diam:        4.40 cm 2.41 cm/m   RA Area:     19.50 cm LA Vol (A2C):   89.9 ml 49.24 ml/m  RA Volume:   53.10 ml  29.08 ml/m LA Vol (A4C):   79.1 ml 43.32 ml/m LA Biplane Vol: 84.9 ml 46.50 ml/m  AORTIC VALVE AV Area (Vmax):    2.13 cm AV Area (Vmean):   1.99 cm AV Area (VTI):     2.10 cm AV Vmax:           162.00 cm/s AV Vmean:          114.000 cm/s AV VTI:            0.413 m AV Peak Grad:      10.5 mmHg AV Mean Grad:      6.0 mmHg LVOT Vmax:         110.00 cm/s LVOT Vmean:        72.300 cm/s LVOT VTI:          0.276 m LVOT/AV VTI ratio: 0.67  AORTA Ao Root diam: 3.60 cm MITRAL VALVE                TRICUSPID VALVE MV Area (PHT): 4.15 cm     TR Peak grad:   39.9 mmHg MV Decel Time: 183 msec     TR Vmax:        316.00 cm/s MV E velocity: 130.00 cm/s MV A velocity: 101.00 cm/s  SHUNTS MV E/A ratio:  1.29         Systemic VTI:  0.28 m                             Systemic Diam: 2.00 cm Isaias Cowman MD Electronically signed by Isaias Cowman MD Signature Date/Time: 06/02/2022/10:37:57 AM    Final    DG Chest 2 View  Result Date: 06/01/2022 CLINICAL DATA:  Dyspnea and leg swelling EXAM: CHEST - 2 VIEW COMPARISON:  None Available. FINDINGS: Mild cardiomegaly. Pulmonary vascular congestion. Small right possible trace left pleural effusions and associated basilar atelectasis. No pneumothorax. No acute osseous abnormality. Aortic atherosclerotic calcification. IMPRESSION: Cardiomegaly and pulmonary vascular congestion. Small right and trace left pleural effusions. Aortic Atherosclerosis (ICD10-I70.0). Electronically Signed   By: Placido Sou M.D.   On: 06/01/2022 17:06     ASSESSMENT:  1.  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 Zeitler is evaluated at the request of Dr. Lonia Mad for hyperleukocytosis. -CBC on 07/23/2020 shows white count 148.3 with differential showing neutrophils, bands, promyelocytes, myelocytes and occasional blasts.  Platelet count was  138 and hemoglobin 12.9. -CBC on 03/05/2020 shows white count 15.2 with hemoglobin 15.4 and normal platelet count. -CBC in November 2020 showed white count 10.3 with normal hemoglobin and platelets. -His wife noted 10 pound weight loss in the last 6 months. -Patient reports easy bruising in the last 6 months, mainly on the upper extremities.  Denies any fevers or night sweats.  No recent infections or hospitalizations. -Last hospitalization was approximately 3 to 4 years ago for TIA. -Dasatinib 70 mg daily started on 08/13/2020.  Held on 09/09/2020 due to fluid retention. -Bone marrow biopsy on 08/13/2020 with karyotype 55, XY,t(9;22), hypercellular bone marrow with morphological features consistent with CML. -2D echo on 09/10/2020 at [facility] showed normal LV size and function with EF 55%.  Mild concentric hypertrophy.  Normal right ventricle size with function.  Mild to moderate MR.  Trivial pericardial effusion.  No pericardial tamponade. -Dasatinib 20 mg daily started on 10/02/2020.   2.  Social/family history: -He lives at home with his wife.  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  He quit smoking in 01/10/89. -Mother died of cancer, type not known to the patient.   PLAN:  1.  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 - Reviewed labs today which shows hemoglobin improved to 9.6, normal white count and platelet count with normal differential. - CMP shows creatinine increased to 1.26.  Rest of LFTs are normal.  Ferritin is 64 and percent saturation is 15. - We have sent a BCR/ABL by quantitative PCR which is pending. 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 - Capsule study on 06/09/2022 showed normal study with no overt GI bleed. - Recommend follow-up in 6 weeks with repeat CBC only to check if it has come back to his baseline of around 13.   2.  CVA/A-fib: - Continue Eliquis twice daily.   He is off of amiodarone.   3.  Memory problems: - Continue memantine 10 mg 2 tablets daily.   4.  Difficulty urination: - Continue Flomax daily.   5.  Parkinson's disease: - His neurologist in [place] is tapering him off of carbidopa, which will be tapered off end of the month.  Orders placed this encounter:  Orders Placed This Encounter  Procedures   CBC with Differential/Platelet      Derek Jack, MD [place] [phone]

## 2022-06-12 LAB — METHYLMALONIC ACID, SERUM: Methylmalonic Acid, Quantitative: 110 nmol/L (ref 0–378)

## 2022-06-15 LAB — COPPER, SERUM: Copper: 130 ug/dL (ref 69–132)

## 2022-06-17 LAB — BCR-ABL1, CML/ALL, PCR, QUANT: b3a2 transcript: 0.2722 %

## 2022-06-25 ENCOUNTER — Encounter: Payer: Self-pay | Admitting: Family

## 2022-06-25 ENCOUNTER — Ambulatory Visit: Payer: Medicare Other | Attending: Family | Admitting: Family

## 2022-06-25 VITALS — BP 140/51 | HR 66 | Resp 14 | Ht 65.0 in | Wt 162.4 lb

## 2022-06-25 DIAGNOSIS — I480 Paroxysmal atrial fibrillation: Secondary | ICD-10-CM

## 2022-06-25 DIAGNOSIS — R0602 Shortness of breath: Secondary | ICD-10-CM | POA: Insufficient documentation

## 2022-06-25 DIAGNOSIS — I10 Essential (primary) hypertension: Secondary | ICD-10-CM

## 2022-06-25 DIAGNOSIS — C9510 Chronic leukemia of unspecified cell type not having achieved remission: Secondary | ICD-10-CM | POA: Insufficient documentation

## 2022-06-25 DIAGNOSIS — I110 Hypertensive heart disease with heart failure: Secondary | ICD-10-CM | POA: Diagnosis not present

## 2022-06-25 DIAGNOSIS — E785 Hyperlipidemia, unspecified: Secondary | ICD-10-CM | POA: Insufficient documentation

## 2022-06-25 DIAGNOSIS — I4891 Unspecified atrial fibrillation: Secondary | ICD-10-CM | POA: Insufficient documentation

## 2022-06-25 DIAGNOSIS — I5032 Chronic diastolic (congestive) heart failure: Secondary | ICD-10-CM

## 2022-06-25 DIAGNOSIS — C9210 Chronic myeloid leukemia, BCR/ABL-positive, not having achieved remission: Secondary | ICD-10-CM

## 2022-06-25 DIAGNOSIS — G20 Parkinson's disease: Secondary | ICD-10-CM | POA: Insufficient documentation

## 2022-06-25 DIAGNOSIS — R5383 Other fatigue: Secondary | ICD-10-CM | POA: Diagnosis not present

## 2022-06-25 DIAGNOSIS — I890 Lymphedema, not elsewhere classified: Secondary | ICD-10-CM

## 2022-06-25 DIAGNOSIS — Z8673 Personal history of transient ischemic attack (TIA), and cerebral infarction without residual deficits: Secondary | ICD-10-CM | POA: Insufficient documentation

## 2022-06-25 NOTE — Patient Instructions (Addendum)
Continue weighing daily and call for an overnight weight gain of 3 pounds or more or a weekly weight gain of more than 5 pounds.   If you have voicemail, please make sure your mailbox is cleaned out so that we may leave a message and please make sure to listen to any voicemails.    If you receive a satisfaction survey regarding the [facility], please take the time to fill it out. This way we can continue to provide excellent care and make any changes that need to be made.    Call us in the future if you need us for anything 

## 2022-06-25 NOTE — Progress Notes (Signed)
Patient ID: Kevin Love, male    DOB: 02-29-1946, 76 y.o.   MRN: [id number]  HPI  Mr Hanawalt is a 76 y/o male with a history of stroke, anemia, atrial fibrillation, leukemia, HTN, hyperlipidemia, parkinson, chronic heart failure and tobacco use.   Echo report from 06/02/22 reviewed and showed an EF of 60-65% along with mild/moderate MR/TR.   Admitted 06/01/22 due to SOB and increasing edema. Hemoglobin noted to be low at 7.3.Cardiology and GI consults obtained. EGD/colonoscopy done. 1 unit PRBc's given. New onset AF. IV lasix given with 1.5L diuresed. Discharged after 3 days.   He presents today for his initial visit with a chief complaint of minimal fatigue with moderate exertion. Describes this as chronic in nature. He has associated shortness of breath, pedal edema, sense of being off-balance and tremors along with this. He denies any dizziness, difficulty sleeping, abdominal distention, palpitations, chest pain, cough or weight gain.   Not adding salt and tries to eat low sodium foods. Did eat a hot dog yesterday from [employer] but then watched his sodium intake closely the rest of the day.   Has furosemide that he takes PRN and generally takes it about once/ week. Just recently bough compression socks. Edema improves when he elevates his legs.   Past Medical History:  Diagnosis Date   Anemia    Atrial fibrillation ([place])    on Eliquis   CHF (congestive heart failure) ([facility])    Chronic Leukemia    Glaucoma    Hyperlipidemia    Hypertension    Parkinson disease ([place]) 07/25/2020   Stroke [facility])    Past Surgical History:  Procedure Laterality Date   BACK SURGERY     COLONOSCOPY WITH PROPOFOL N/A 06/03/2022   Procedure: COLONOSCOPY WITH PROPOFOL;  Surgeon: Lesly Rubenstein, MD;  Location: [facility] ENDOSCOPY;  Service: Endoscopy;  Laterality: N/A;   ESOPHAGOGASTRODUODENOSCOPY (EGD) WITH PROPOFOL N/A 06/03/2022   Procedure: ESOPHAGOGASTRODUODENOSCOPY (EGD) WITH PROPOFOL;  Surgeon: Lesly Rubenstein, MD;  Location: [facility] ENDOSCOPY;  Service: Endoscopy;  Laterality: N/A;   GIVENS CAPSULE STUDY N/A 06/04/2022   Procedure: GIVENS CAPSULE STUDY;  Surgeon: Lesly Rubenstein, MD;  Location: [facility] ENDOSCOPY;  Service: Endoscopy;  Laterality: N/A;   toe removed Right    History reviewed. No pertinent family history. Social History   Tobacco Use   Smoking status: Former    Types: Cigarettes   Smokeless tobacco: Never  Substance Use Topics   Alcohol use: Never   Allergies  Allergen Reactions   Penicillins Rash   Prior to Admission medications   Medication Sig Start Date End Date Taking? Authorizing Provider  acetaminophen (TYLENOL) 500 MG tablet Take 1,000 mg by mouth every 6 (six) hours as needed for moderate pain.   Yes [provider]  apixaban (ELIQUIS) 5 MG TABS tablet Take 1 tablet (5 mg total) by mouth 2 (two) times daily. 02/13/22  Yes Sharen Hones, MD  atorvastatin (LIPITOR) 80 MG tablet Take 1 tablet (80 mg total) by mouth daily. 02/14/22  Yes Sharen Hones, MD  clonazePAM (KLONOPIN) 0.5 MG tablet Take 0.5 mg by mouth daily.   Yes [provider]  cyanocobalamin 1000 MCG tablet Take 1,000 mcg by mouth daily.   Yes [provider]  dasatinib (SPRYCEL) 20 MG tablet Take 1 tablet (20 mg total) by mouth daily. 02/17/22  Yes Derek Jack, MD  furosemide (LASIX) 20 MG tablet Take 1 tablet (20 mg total) by mouth daily as needed (take daily in  the morning as needed for ankle swelling). 05/12/22  Yes Derek Jack, MD  latanoprost (XALATAN) 0.005 % ophthalmic solution Place 1 drop into the right eye at bedtime. 02/12/20  Yes [provider]  lisinopril (ZESTRIL) 10 MG tablet Take 10 mg by mouth daily. 02/12/20  Yes [provider]  memantine (NAMENDA) 10 MG tablet Take 10 mg by mouth 2 (two) times daily.   Yes [provider]  Multiple Vitamins-Minerals (CENTRUM SILVER 50+MEN PO) Take 1 tablet by mouth daily.   Yes  [provider]  tamsulosin (FLOMAX) 0.4 MG CAPS capsule Take 0.4 mg by mouth daily.   Yes [provider]  carbidopa-levodopa (SINEMET IR) 25-100 MG tablet Take 1 tablet by mouth daily for 6 days. Patient not taking: Reported on 06/25/2022 06/04/22 06/10/22  Annita Brod, MD   Review of Systems  Constitutional:  Positive for fatigue. Negative for appetite change.  HENT:  Negative for congestion, postnasal drip and sore throat.   Eyes: Negative.   Respiratory:  Positive for shortness of breath. Negative for cough and chest tightness.   Cardiovascular:  Positive for leg swelling. Negative for chest pain and palpitations.  Gastrointestinal:  Negative for abdominal distention and abdominal pain.  Endocrine: Negative.   Genitourinary: Negative.   Musculoskeletal:  Negative for back pain and neck pain.  Skin: Negative.   Allergic/Immunologic: Negative.   Neurological:  Positive for tremors (in arms). Negative for dizziness and light-headedness.       Off-balance  Hematological:  Negative for adenopathy. Does not bruise/bleed easily.  Psychiatric/Behavioral:  Negative for dysphoric mood and sleep disturbance (sleeping on 1 pillow). The patient is not nervous/anxious.    Vitals:   06/25/22 0832  BP: (!) 140/51  Pulse: 66  Resp: 14  SpO2: 96%  Weight: 162 lb 6 oz (73.7 kg)  Height: '5\' 5"'$  (1.651 m)   Wt Readings from Last 3 Encounters:  06/25/22 162 lb 6 oz (73.7 kg)  06/10/22 158 lb 9.6 oz (71.9 kg)  06/04/22 155 lb 13.8 oz (70.7 kg)   Lab Results  Component Value Date   CREATININE 1.26 (H) 06/10/2022   CREATININE 1.16 06/04/2022   CREATININE 1.18 06/03/2022   Physical Exam Vitals and nursing note reviewed. Exam conducted with a chaperone present (wife).  Constitutional:      Appearance: Normal appearance.  HENT:     Head: Normocephalic and atraumatic.  Cardiovascular:     Rate and Rhythm: Normal rate and regular rhythm.  Pulmonary:     Effort: Pulmonary  effort is normal. No respiratory distress.     Breath sounds: No wheezing or rales.  Abdominal:     General: There is no distension.     Palpations: Abdomen is soft.     Tenderness: There is no abdominal tenderness.  Musculoskeletal:        General: No tenderness.     Cervical back: Normal range of motion and neck supple.     Right lower leg: Edema (1+ pitting) present.     Left lower leg: Edema (1+ pitting) present.  Skin:    General: Skin is warm and dry.  Neurological:     Mental Status: He is alert.     Motor: Tremor (both arms) present.  Psychiatric:        Mood and Affect: Mood normal.        Behavior: Behavior normal.    Assessment & Plan:  1: Chronic heart failure with preserved ejection fraction without structural  changes- - NYHA class II - euvolemic - weighing daily; reminded to call for an overnight weight gain of >2 pounds or a weekly weight gain of > 5 pounds - not adding salt and tries to eat low sodium foods; did eat a hot dog yesterday but then monitored his sodium content carefully the rest of the day - has furosemide that he can take PRN and he takes it ~ 1/ week - BNP 06/01/22 was 174.6  2: HTN- - BP slightly elevated (140/51) - saw PCP Venetia Maxon) 06/01/22 - BMP 06/10/22 reviewed and showed sodium 141, potassium 4.3, creatinine 1.26 & GFR 59  3: Atrial fibrillation- - saw cardiology ([place]) 06/09/22 - in regular rhythm today  4: Chronic leukemia- - saw oncology Delton Coombes) 06/10/22  5: Lymphedema- - stage 2 - tries to be active  - encouraged to elevate his legs when sitting for long periods of time - recently bought compression socks and advised to put them on every morning with removal in the evening   Medication list reviewed.   Due to HF stability, will not make a return appointment at this time. Advised him to follow closely with PCP and cardiology and to call us for any questions or issues in the future. He was comfortable with this plan.

## 2022-07-27 ENCOUNTER — Inpatient Hospital Stay: Payer: Medicare Other

## 2022-07-27 ENCOUNTER — Encounter: Payer: Self-pay | Admitting: Hematology

## 2022-07-27 ENCOUNTER — Inpatient Hospital Stay: Payer: Medicare Other | Attending: Hematology | Admitting: Hematology

## 2022-07-27 VITALS — BP 142/55 | HR 63 | Temp 97.5°F | Resp 18 | Ht 65.0 in | Wt 154.2 lb

## 2022-07-27 DIAGNOSIS — D631 Anemia in chronic kidney disease: Secondary | ICD-10-CM | POA: Diagnosis not present

## 2022-07-27 DIAGNOSIS — N189 Chronic kidney disease, unspecified: Secondary | ICD-10-CM | POA: Insufficient documentation

## 2022-07-27 DIAGNOSIS — Z8673 Personal history of transient ischemic attack (TIA), and cerebral infarction without residual deficits: Secondary | ICD-10-CM | POA: Diagnosis not present

## 2022-07-27 DIAGNOSIS — C9210 Chronic myeloid leukemia, BCR/ABL-positive, not having achieved remission: Secondary | ICD-10-CM

## 2022-07-27 DIAGNOSIS — D500 Iron deficiency anemia secondary to blood loss (chronic): Secondary | ICD-10-CM | POA: Insufficient documentation

## 2022-07-27 DIAGNOSIS — G20A1 Parkinson's disease without dyskinesia, without mention of fluctuations: Secondary | ICD-10-CM | POA: Insufficient documentation

## 2022-07-27 DIAGNOSIS — Z79899 Other long term (current) drug therapy: Secondary | ICD-10-CM | POA: Insufficient documentation

## 2022-07-27 DIAGNOSIS — E611 Iron deficiency: Secondary | ICD-10-CM | POA: Insufficient documentation

## 2022-07-27 DIAGNOSIS — I4891 Unspecified atrial fibrillation: Secondary | ICD-10-CM | POA: Diagnosis not present

## 2022-07-27 DIAGNOSIS — Z87891 Personal history of nicotine dependence: Secondary | ICD-10-CM | POA: Diagnosis not present

## 2022-07-27 LAB — CBC WITH DIFFERENTIAL/PLATELET
Abs Immature Granulocytes: 0.02 10*3/uL (ref 0.00–0.07)
Basophils Absolute: 0.1 10*3/uL (ref 0.0–0.1)
Basophils Relative: 1 %
Eosinophils Absolute: 0.3 10*3/uL (ref 0.0–0.5)
Eosinophils Relative: 7 %
HCT: 31 % — ABNORMAL LOW (ref 39.0–52.0)
Hemoglobin: 9.3 g/dL — ABNORMAL LOW (ref 13.0–17.0)
Immature Granulocytes: 0 %
Lymphocytes Relative: 24 %
Lymphs Abs: 1.2 10*3/uL (ref 0.7–4.0)
MCH: 27.1 pg (ref 26.0–34.0)
MCHC: 30 g/dL (ref 30.0–36.0)
MCV: 90.4 fL (ref 80.0–100.0)
Monocytes Absolute: 0.6 10*3/uL (ref 0.1–1.0)
Monocytes Relative: 12 %
Neutro Abs: 2.8 10*3/uL (ref 1.7–7.7)
Neutrophils Relative %: 56 %
Platelets: 211 10*3/uL (ref 150–400)
RBC: 3.43 MIL/uL — ABNORMAL LOW (ref 4.22–5.81)
RDW: 15.9 % — ABNORMAL HIGH (ref 11.5–15.5)
WBC: 5 10*3/uL (ref 4.0–10.5)
nRBC: 0 % (ref 0.0–0.2)

## 2022-07-27 NOTE — Patient Instructions (Addendum)
[place] at [facility] Discharge Instructions   You were seen and examined today by Dr. Delton Coombes.  He reviewed the results of your lab work. Your hemoglobin remains low at 9. We will arrange for you to have 2 iron infusions, 1 week apart.  Continue Sprycel as prescribed.   We will see you back in 2 months w/repeat labs.    Thank you for choosing [place] at [facility] to provide your oncology and hematology care.  To afford each patient quality time with our provider, please arrive at least 15 minutes before your scheduled appointment time.   If you have a lab appointment with the [facility] please come in thru the Main Entrance and check in at the main information desk.  You need to re-schedule your appointment should you arrive 10 or more minutes late.  We strive to give you quality time with our providers, and arriving late affects you and other patients whose appointments are after yours.  Also, if you no show three or more times for appointments you may be dismissed from the clinic at the providers discretion.     Again, thank you for choosing [facility].  Our hope is that these requests will decrease the amount of time that you wait before being seen by our physicians.       _____________________________________________________________  Should you have questions after your visit to [facility], please contact our office at [phone] and follow the prompts.  Our office hours are 8:00 a.m. and 4:30 p.m. Monday - Friday.  Please note that voicemails left after 4:00 p.m. may not be returned until the following business day.  We are closed weekends and major holidays.  You do have access to a nurse 24-7, just call the main number to the clinic [phone] and do not press any options, hold on the line and a nurse will answer the phone.    For prescription refill requests, have your pharmacy contact our  office and allow 72 hours.    Due to Covid, you will need to wear a mask upon entering the hospital. If you do not have a mask, a mask will be given to you at the Main Entrance upon arrival. For doctor visits, patients may have 1 support person age 78 or older with them. For treatment visits, patients can not have anyone with them due to social distancing guidelines and our immunocompromised population.

## 2022-07-27 NOTE — Progress Notes (Signed)
Patient is taking Sprycel as prescribed.  He has not missed any doses and reports no side effects at this time.   

## 2022-07-27 NOTE — Progress Notes (Signed)
Kevin Love, Kevin Love [id number]   CLINIC:  Medical Oncology/Hematology  PCP:  Baxter Hire, MD [address] / [place] [address]  [phone]  REASON FOR VISIT:  Follow-up for CML  PRIOR THERAPY: none  CURRENT THERAPY: Sprycel 20 mg daily  INTERVAL HISTORY:  Kevin Love, a 76 y.o. 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 He reports that he is taking Sprycel 20 mg daily without any major problems.  Energy levels are reported as 60%.  Denies any frank bleeding per rectum or melena.  REVIEW OF SYSTEMS:  Review of Systems  Musculoskeletal:  Positive for back pain (Back pain, flared up in 2 weeks).  All other systems reviewed and are negative.   PAST MEDICAL/SURGICAL HISTORY:  Past Medical History:  Diagnosis Date   Anemia    Atrial fibrillation ([place])    on Eliquis   CHF (congestive heart failure) ([place])    Chronic Leukemia    Glaucoma    Hyperlipidemia    Hypertension    Parkinson disease 07/25/2020   Stroke [facility])    Past Surgical History:  Procedure Laterality Date   BACK SURGERY     COLONOSCOPY WITH PROPOFOL N/A 06/03/2022   Procedure: COLONOSCOPY WITH PROPOFOL;  Surgeon: Lesly Rubenstein, MD;  Location: [facility] ENDOSCOPY;  Service: Endoscopy;  Laterality: N/A;   ESOPHAGOGASTRODUODENOSCOPY (EGD) WITH PROPOFOL N/A 06/03/2022   Procedure: ESOPHAGOGASTRODUODENOSCOPY (EGD) WITH PROPOFOL;  Surgeon: Lesly Rubenstein, MD;  Location: [facility] ENDOSCOPY;  Service: Endoscopy;  Laterality: N/A;   GIVENS CAPSULE STUDY N/A 06/04/2022   Procedure: GIVENS CAPSULE STUDY;  Surgeon: Lesly Rubenstein, MD;  Location: [facility] ENDOSCOPY;  Service: Endoscopy;  Laterality: N/A;   toe removed Right     SOCIAL HISTORY:  Social History   Socioeconomic History   Marital status: Married    Spouse name: Not on file   Number of children: Not on file   Years of education: Not on file   Highest education level: Not on  file  Occupational History   Not on file  Tobacco Use   Smoking status: Former    Types: Cigarettes   Smokeless tobacco: Never  Substance and Sexual Activity   Alcohol use: Never   Drug use: Never   Sexual activity: Not Currently  Other Topics Concern   Not on file  Social History Narrative   Not on file   Social Determinants of Health   Financial Resource Strain: Low Risk  (08/22/2020)   Overall Financial Resource Strain (CARDIA)    Difficulty of Paying Living Expenses: Not hard at all  Food Insecurity: No Food Insecurity (08/22/2020)   Hunger Vital Sign    Worried About Running Out of Food in the Last Year: Never true    [place] in the Last Year: Never true  Transportation Needs: No Transportation Needs (08/22/2020)   PRAPARE - Hydrologist (Medical): No    Lack of Transportation (Non-Medical): No  Physical Activity: Insufficiently Active (08/22/2020)   Exercise Vital Sign    Days of Exercise per Week: 7 days    Minutes of Exercise per Session: 10 min  Stress: No Stress Concern Present (08/22/2020)   [place]    Feeling of Stress : Not at all  Social Connections: Moderately Integrated (08/22/2020)   Social Connection and Isolation Panel [NHANES]    Frequency of  Communication with Friends and Family: Three times a week    Frequency of Social Gatherings with Friends and Family: Once a week    Attends Religious Services: More than 4 times per year    Active Member of [employer] or Organizations: No    Attends Archivist Meetings: Never    Marital Status: Married  Human resources officer Violence: Not At Risk (08/22/2020)   Humiliation, Afraid, Rape, and Kick questionnaire    Fear of Current or Ex-Partner: No    Emotionally Abused: No    Physically Abused: No    Sexually Abused: No    FAMILY HISTORY:  History reviewed. No pertinent family history.  CURRENT MEDICATIONS:   Current Outpatient Medications  Medication Sig Dispense Refill   promethazine-dextromethorphan (PROMETHAZINE-DM) 6.25-15 MG/5ML syrup Take 5 mLs by mouth at bedtime as needed.     acetaminophen (TYLENOL) 500 MG tablet Take 1,000 mg by mouth every 6 (six) hours as needed for moderate pain.     apixaban (ELIQUIS) 5 MG TABS tablet Take 1 tablet (5 mg total) by mouth 2 (two) times daily. 60 tablet 0   atorvastatin (LIPITOR) 80 MG tablet Take 1 tablet (80 mg total) by mouth daily. 30 tablet 0   carbidopa-levodopa (SINEMET IR) 25-100 MG tablet Take 1 tablet by mouth daily for 6 days. (Patient not taking: Reported on 06/25/2022) 6 tablet 0   clonazePAM (KLONOPIN) 0.5 MG tablet Take 0.5 mg by mouth daily.     cyanocobalamin 1000 MCG tablet Take 1,000 mcg by mouth daily.     dasatinib (SPRYCEL) 20 MG tablet Take 1 tablet (20 mg total) by mouth daily. 30 tablet 11   furosemide (LASIX) 20 MG tablet Take 1 tablet (20 mg total) by mouth daily as needed (take daily in the morning as needed for ankle swelling). 30 tablet 1   latanoprost (XALATAN) 0.005 % ophthalmic solution Place 1 drop into the right eye at bedtime.     lisinopril (ZESTRIL) 10 MG tablet Take 10 mg by mouth daily.     memantine (NAMENDA) 10 MG tablet Take 10 mg by mouth 2 (two) times daily.     Multiple Vitamins-Minerals (CENTRUM SILVER 50+MEN PO) Take 1 tablet by mouth daily.     tamsulosin (FLOMAX) 0.4 MG CAPS capsule Take 0.4 mg by mouth daily.     No current facility-administered medications for this visit.    ALLERGIES:  Allergies  Allergen Reactions   Penicillins Rash    PHYSICAL EXAM:  Performance status (ECOG): 1 - Symptomatic but completely ambulatory  Vitals:   07/27/22 1136  BP: (!) 142/55  Pulse: 63  Resp: 18  Temp: (!) 97.5 F (36.4 C)  SpO2: 99%   Wt Readings from Last 3 Encounters:  07/27/22 154 lb 3.2 oz (69.9 kg)  06/25/22 162 lb 6 oz (73.7 kg)  06/10/22 158 lb 9.6 oz (71.9 kg)   Physical Exam Vitals  reviewed.  Constitutional:      Appearance: Normal appearance.  Cardiovascular:     Rate and Rhythm: Normal rate and regular rhythm.     Pulses: Normal pulses.     Heart sounds: Normal heart sounds.  Pulmonary:     Effort: Pulmonary effort is normal.     Breath sounds: Normal breath sounds.  Abdominal:     Palpations: Abdomen is soft. There is no hepatomegaly, splenomegaly or mass.     Tenderness: There is no abdominal tenderness.  Musculoskeletal:     Right lower leg: 1+ Edema  present.     Left lower leg: 1+ Edema present.  Neurological:     General: No focal deficit present.     Mental Status: He is alert and oriented to person, place, and time.  Psychiatric:        Mood and Affect: Mood normal.        Behavior: Behavior normal.     LABORATORY DATA:  I have reviewed the labs as listed.     Latest Ref Rng & Units 07/27/2022   11:16 AM 06/10/2022    2:14 PM 06/04/2022    1:32 PM  CBC  WBC 4.0 - 10.5 K/uL 5.0  5.1  6.6   Hemoglobin 13.0 - 17.0 g/dL 9.3  9.6  8.8   Hematocrit 39.0 - 52.0 % 31.0  30.7  27.8   Platelets 150 - 400 K/uL 211  212  203       Latest Ref Rng & Units 06/10/2022    2:13 PM 06/04/2022    1:32 PM 06/03/2022    4:53 AM  CMP  Glucose 70 - 99 mg/dL 135  113  109   BUN 8 - 23 mg/dL 18  18  19    Creatinine 0.61 - 1.24 mg/dL 1.26  1.16  1.18   Sodium 135 - 145 mmol/L 141  139  143   Potassium 3.5 - 5.1 mmol/L 4.3  4.1  3.9   Chloride 98 - 111 mmol/L 111  108  112   CO2 22 - 32 mmol/L 25  25  27    Calcium 8.9 - 10.3 mg/dL 8.9  8.4  8.1   Total Protein 6.5 - 8.1 g/dL 6.6     Total Bilirubin 0.3 - 1.2 mg/dL 0.8     Alkaline Phos 38 - 126 U/L 123     AST 15 - 41 U/L 22     ALT 0 - 44 U/L 7         Component Value Date/Time   RBC 3.43 (L) 07/27/2022 1116   MCV 90.4 07/27/2022 1116   MCH 27.1 07/27/2022 1116   MCHC 30.0 07/27/2022 1116   RDW 15.9 (H) 07/27/2022 1116   LYMPHSABS 1.2 07/27/2022 1116   MONOABS 0.6 07/27/2022 1116   EOSABS 0.3  07/27/2022 1116   BASOSABS 0.1 07/27/2022 1116    DIAGNOSTIC IMAGING:  I have independently reviewed the scans and discussed with the patient. No results found.   ASSESSMENT:  1.  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 is evaluated at the request of Dr. Lonia Mad for hyperleukocytosis. -CBC on 07/23/2020 shows white count 148.3 with differential showing neutrophils, bands, promyelocytes, myelocytes and occasional blasts.  Platelet count was 138 and hemoglobin 12.9. -CBC on 03/05/2020 shows white count 15.2 with hemoglobin 15.4 and normal platelet count. -CBC in November 2020 showed white count 10.3 with normal hemoglobin and platelets. -His wife noted 10 pound weight loss in the last 6 months. -Patient reports easy bruising in the last 6 months, mainly on the upper extremities.  Denies any fevers or night sweats.  No recent infections or hospitalizations. -Last hospitalization was approximately 3 to 4 years ago for TIA. -Dasatinib 70 mg daily started on 08/13/2020.  Held on 09/09/2020 due to fluid retention. -Bone marrow biopsy on 08/13/2020 with karyotype 89, XY,t(9;22), hypercellular bone marrow with morphological features consistent with CML. -2D echo on 09/10/2020 at [facility] showed normal LV size and function with EF 55%.  Mild concentric hypertrophy.  Normal right ventricle size with  function.  Mild to moderate MR.  Trivial pericardial effusion.  No pericardial tamponade. -Dasatinib 20 mg daily started on 10/02/2020.   2.  Social/family history: -He lives at home with his wife.  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  He quit smoking in 1988-12-24. -Mother died of cancer, type not known to the patient.   PLAN:  1.  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 - Reviewed BCR/ABL by quantitative PCR from 06/10/2022 which showed B3 A2 transcript increased to 0.27 from 0.1019. - Reviewed CBC today which showed normal white  count with normal differential and platelet count.  Hemoglobin is 9.3. - Recommend continuing same dose of Dasatinib 20 mg daily.  Plan to repeat BCR/ABL in 2 months.   2.  CVA/A-fib: - Continue Eliquis twice daily.   3.  Memory problems: - Continue memantine 10 mg 2 tablets daily.   4.  Difficulty urination: - Continue Flomax daily.   5.  Parkinson's disease: - His neurologist in [place] has tapered off carbidopa.  He is stumbling while walking and has some near falls.  6.  Normocytic anemia: - This is from CKD and relative iron deficiency.  Also component of bleeding.  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  Capsule study showed normal on 06/09/2022. - Ferritin is 64 and percent saturation 15. - Recommend Feraheme weekly x2.  Orders placed this encounter:  Orders Placed This Encounter  Procedures   CBC with Differential   Comprehensive metabolic panel   Iron and TIBC ([facility] ONLY)   Ferritin   Lactate dehydrogenase   BCR-ABL1, CML/ALL, PCR, QUANT      Derek Jack, MD [place] [phone]

## 2022-08-03 ENCOUNTER — Inpatient Hospital Stay: Payer: Medicare Other

## 2022-08-03 VITALS — BP 159/67 | HR 61 | Temp 97.7°F | Resp 18

## 2022-08-03 DIAGNOSIS — D500 Iron deficiency anemia secondary to blood loss (chronic): Secondary | ICD-10-CM

## 2022-08-03 DIAGNOSIS — C9210 Chronic myeloid leukemia, BCR/ABL-positive, not having achieved remission: Secondary | ICD-10-CM | POA: Diagnosis not present

## 2022-08-03 DIAGNOSIS — D62 Acute posthemorrhagic anemia: Secondary | ICD-10-CM

## 2022-08-03 MED ORDER — SODIUM CHLORIDE 0.9 % IV SOLN
510.0000 mg | Freq: Once | INTRAVENOUS | Status: AC
  Administered 2022-08-03: 510 mg via INTRAVENOUS
  Filled 2022-08-03: qty 510

## 2022-08-03 MED ORDER — SODIUM CHLORIDE 0.9 % IV SOLN: Freq: Once | INTRAVENOUS | Status: AC

## 2022-08-03 NOTE — Patient Instructions (Signed)
[facility] [place]  Discharge Instructions: Thank you for choosing [facility] to provide your oncology and hematology care.  If you have a lab appointment with the Cancer Center, please come in thru the Main Entrance and check in at the main information desk.  Wear comfortable clothing and clothing appropriate for easy access to any Portacath or PICC line.   We strive to give you quality time with your provider. You may need to reschedule your appointment if you arrive late (15 or more minutes).  Arriving late affects you and other patients whose appointments are after yours.  Also, if you miss three or more appointments without notifying the office, you may be dismissed from the clinic at the provider's discretion.      For prescription refill requests, have your pharmacy contact our office and allow 72 hours for refills to be completed.    Today you received the following Feraheme, return as scheduled.   To help prevent nausea and vomiting after your treatment, we encourage you to take your nausea medication as directed.  BELOW ARE SYMPTOMS THAT SHOULD BE REPORTED IMMEDIATELY: *FEVER GREATER THAN 100.4 F (38 C) OR HIGHER *CHILLS OR SWEATING *NAUSEA AND VOMITING THAT IS NOT CONTROLLED WITH YOUR NAUSEA MEDICATION *UNUSUAL SHORTNESS OF BREATH *UNUSUAL BRUISING OR BLEEDING *URINARY PROBLEMS (pain or burning when urinating, or frequent urination) *BOWEL PROBLEMS (unusual diarrhea, constipation, pain near the anus) TENDERNESS IN MOUTH AND THROAT WITH OR WITHOUT PRESENCE OF ULCERS (sore throat, sores in mouth, or a toothache) UNUSUAL RASH, SWELLING OR PAIN  UNUSUAL VAGINAL DISCHARGE OR ITCHING   Items with * indicate a potential emergency and should be followed up as soon as possible or go to the Emergency Department if any problems should occur.  Please show the CHEMOTHERAPY ALERT CARD or IMMUNOTHERAPY ALERT CARD at check-in to the Emergency Department and  triage nurse.  Should you have questions after your visit or need to cancel or reschedule your appointment, please contact [facility] [place] [phone]  and follow the prompts.  Office hours are 8:00 a.m. to 4:30 p.m. Monday - Friday. Please note that voicemails left after 4:00 p.m. may not be returned until the following business day.  We are closed weekends and major holidays. You have access to a nurse at all times for urgent questions. Please call the main number to the clinic [phone] and follow the prompts.  For any non-urgent questions, you may also contact your provider using MyChart. We now offer e-Visits for anyone 18 and older to request care online for non-urgent symptoms. For details visit [email or web address].   Also download the MyChart app! Go to the app store, search "MyChart", open the app, select [place], and log in with your MyChart username and password.  Masks are optional in the cancer centers. If you would like for your care team to wear a mask while they are taking care of you, please let them know. You may have one support person who is at least 78 years old accompany you for your appointments.  

## 2022-08-03 NOTE — Progress Notes (Signed)
Patient presents today for Feraheme, patient reports taking pre-medications at home. Patient tolerated iron infusion with no complaints voiced. Peripheral IV site clean and dry with good blood return noted before and after infusion. Band aid applied. VSS with discharge and left in satisfactory condition with no s/s of distress noted.

## 2022-08-10 ENCOUNTER — Inpatient Hospital Stay: Payer: Medicare Other

## 2022-08-10 VITALS — BP 143/58 | HR 65 | Temp 98.9°F | Resp 16

## 2022-08-10 DIAGNOSIS — D500 Iron deficiency anemia secondary to blood loss (chronic): Secondary | ICD-10-CM

## 2022-08-10 DIAGNOSIS — C9210 Chronic myeloid leukemia, BCR/ABL-positive, not having achieved remission: Secondary | ICD-10-CM | POA: Diagnosis not present

## 2022-08-10 DIAGNOSIS — D62 Acute posthemorrhagic anemia: Secondary | ICD-10-CM

## 2022-08-10 MED ORDER — SODIUM CHLORIDE 0.9 % IV SOLN: Freq: Once | INTRAVENOUS | Status: AC

## 2022-08-10 MED ORDER — SODIUM CHLORIDE 0.9 % IV SOLN
510.0000 mg | Freq: Once | INTRAVENOUS | Status: AC
  Administered 2022-08-10: 510 mg via INTRAVENOUS
  Filled 2022-08-10: qty 510

## 2022-08-10 NOTE — Patient Instructions (Signed)
[facility] [place]  Discharge Instructions: Thank you for choosing [facility] to provide your oncology and hematology care.  If you have a lab appointment with the Cancer Center, please come in thru the Main Entrance and check in at the main information desk.  Wear comfortable clothing and clothing appropriate for easy access to any Portacath or PICC line.   We strive to give you quality time with your provider. You may need to reschedule your appointment if you arrive late (15 or more minutes).  Arriving late affects you and other patients whose appointments are after yours.  Also, if you miss three or more appointments without notifying the office, you may be dismissed from the clinic at the provider's discretion.      For prescription refill requests, have your pharmacy contact our office and allow 72 hours for refills to be completed.    Today you received the following Venofer, return as scheduled.   To help prevent nausea and vomiting after your treatment, we encourage you to take your nausea medication as directed.  BELOW ARE SYMPTOMS THAT SHOULD BE REPORTED IMMEDIATELY: *FEVER GREATER THAN 100.4 F (38 C) OR HIGHER *CHILLS OR SWEATING *NAUSEA AND VOMITING THAT IS NOT CONTROLLED WITH YOUR NAUSEA MEDICATION *UNUSUAL SHORTNESS OF BREATH *UNUSUAL BRUISING OR BLEEDING *URINARY PROBLEMS (pain or burning when urinating, or frequent urination) *BOWEL PROBLEMS (unusual diarrhea, constipation, pain near the anus) TENDERNESS IN MOUTH AND THROAT WITH OR WITHOUT PRESENCE OF ULCERS (sore throat, sores in mouth, or a toothache) UNUSUAL RASH, SWELLING OR PAIN  UNUSUAL VAGINAL DISCHARGE OR ITCHING   Items with * indicate a potential emergency and should be followed up as soon as possible or go to the Emergency Department if any problems should occur.  Please show the CHEMOTHERAPY ALERT CARD or IMMUNOTHERAPY ALERT CARD at check-in to the Emergency Department and triage  nurse.  Should you have questions after your visit or need to cancel or reschedule your appointment, please contact [facility] [place] [phone]  and follow the prompts.  Office hours are 8:00 a.m. to 4:30 p.m. Monday - Friday. Please note that voicemails left after 4:00 p.m. may not be returned until the following business day.  We are closed weekends and major holidays. You have access to a nurse at all times for urgent questions. Please call the main number to the clinic [phone] and follow the prompts.  For any non-urgent questions, you may also contact your provider using MyChart. We now offer e-Visits for anyone 18 and older to request care online for non-urgent symptoms. For details visit [email or web address].   Also download the MyChart app! Go to the app store, search "MyChart", open the app, select [place], and log in with your MyChart username and password.  Masks are optional in the cancer centers. If you would like for your care team to wear a mask while they are taking care of you, please let them know. You may have one support person who is at least 78 years old accompany you for your appointments.  

## 2022-08-10 NOTE — Progress Notes (Signed)
Pt took tylenol and allegra this am.

## 2022-08-10 NOTE — Progress Notes (Signed)
Patient tolerated iron infusion with no complaints voiced.  Peripheral IV site clean and dry with good blood return noted before and after infusion.  Band aid applied.  VSS with discharge and left in satisfactory condition with no s/s of distress noted.   

## 2022-08-17 ENCOUNTER — Inpatient Hospital Stay: Payer: Medicare Other | Attending: Internal Medicine | Admitting: Internal Medicine

## 2022-08-17 ENCOUNTER — Encounter: Payer: Self-pay | Admitting: Internal Medicine

## 2022-08-17 ENCOUNTER — Inpatient Hospital Stay: Payer: Medicare Other

## 2022-08-17 VITALS — BP 135/54 | HR 68 | Temp 97.6°F | Resp 20 | Wt 156.8 lb

## 2022-08-17 DIAGNOSIS — D500 Iron deficiency anemia secondary to blood loss (chronic): Secondary | ICD-10-CM | POA: Diagnosis not present

## 2022-08-17 DIAGNOSIS — D62 Acute posthemorrhagic anemia: Secondary | ICD-10-CM

## 2022-08-17 DIAGNOSIS — G20A1 Parkinson's disease without dyskinesia, without mention of fluctuations: Secondary | ICD-10-CM | POA: Insufficient documentation

## 2022-08-17 DIAGNOSIS — R39198 Other difficulties with micturition: Secondary | ICD-10-CM | POA: Insufficient documentation

## 2022-08-17 DIAGNOSIS — D509 Iron deficiency anemia, unspecified: Secondary | ICD-10-CM | POA: Insufficient documentation

## 2022-08-17 DIAGNOSIS — Z8673 Personal history of transient ischemic attack (TIA), and cerebral infarction without residual deficits: Secondary | ICD-10-CM | POA: Diagnosis not present

## 2022-08-17 DIAGNOSIS — Z5111 Encounter for antineoplastic chemotherapy: Secondary | ICD-10-CM | POA: Insufficient documentation

## 2022-08-17 DIAGNOSIS — Z87891 Personal history of nicotine dependence: Secondary | ICD-10-CM | POA: Insufficient documentation

## 2022-08-17 DIAGNOSIS — Z7901 Long term (current) use of anticoagulants: Secondary | ICD-10-CM | POA: Diagnosis not present

## 2022-08-17 DIAGNOSIS — I4891 Unspecified atrial fibrillation: Secondary | ICD-10-CM | POA: Diagnosis not present

## 2022-08-17 DIAGNOSIS — C9210 Chronic myeloid leukemia, BCR/ABL-positive, not having achieved remission: Secondary | ICD-10-CM | POA: Diagnosis present

## 2022-08-17 DIAGNOSIS — D638 Anemia in other chronic diseases classified elsewhere: Secondary | ICD-10-CM | POA: Diagnosis not present

## 2022-08-17 NOTE — Progress Notes (Signed)
[name]  Patient Care Team: Kevin Hire, MD as PCP - General (Internal Medicine) Kevin Jack, MD as Consulting Physician (Hematology)  CURRENT TREATMENT-  Dasatinib 70 mg started on 08/13/20.  Dose reduced to 20 mg on 10/02/2020 due to fluid retention  ASSESSMENT & PLAN:  Kevin Love 77 y.o. male with pmh of hypertension, hyperlipidemia, A-fib on Eliquis, CML, CHF, Parkinson's disease, stroke, was seen at [facility] hematology on 08/17/2022 as a transfer of care from Dr. Delton Love at [facility] due to close proximity to home for further management of CML.  #CML -Diagnosed 08/02/2020 when he presented with leukocytosis. -Marrow biopsy done on 08/13/2020 showed karyotype 61, XY t(9;22).  Hypercellular marrow with morphological features consistent with CML.  -Started on Dasatinib 70 mg on 08/13/2020.  Held on 09/09/2020 due to fluid retention.  Echo done on 09/10/2020 at [facility] showed normal LV size with EF 55%.  Mild concentric hypertrophy. mild to moderate MR.  -Dose reduced Dasatinib to 20 mg daily on 10/02/2020.  He has been tolerating it well.  Uses Lasix 20 mg as needed once every 2 weeks. -BCR/ABL PCR transcript  08/13/2021 0.0932 11/06/2021   0.0917 02/17/2022     0.1019 06/10/2022   0.2722  Dr. Delton Love was planning to repeat BCR/ABL transcripts in 80-month  Agree with the plan.  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 -We will repeat iron level with next visit. 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  Endoscopy and capsule study unremarkable.  #CVA/A-fib: - Continue Eliquis twice daily.   # Memory problems: - Continue memantine 10 mg 2 tablets daily.   # Difficulty urination: - Continue Flomax daily.   # Parkinson's disease: [place]  He is stumbling while  walking and has some near falls.    Orders Placed This Encounter  Procedures   CBC with Differential    Standing Status:   Future    Standing Expiration Date:   08/17/2023   Ferritin    Standing Status:   Future    Standing Expiration Date:   08/18/2023   Iron and TIBC([facility])    Standing Status:   Future    Standing Expiration Date:   08/18/2023   BCR-ABL1, CML/ALL, PCR, QUANT    Standing Status:   Future    Standing Expiration Date:   08/18/2023    RTC in 6 weeks for MD visit, labs 1 week prior.  The total time spent in the appointment was 30 minutes encounter with patients including review of chart and various tests results, discussions about plan of care and coordination of care plan   All questions were answered. The patient knows to call the clinic with any problems, questions or concerns. No barriers to learning was detected.  KJane Canary MD 10/30/202310:36 AM   HISTORY OF PRESENTING ILLNESS:  Kevin Rhines89y.o. male with pmh of hypertension, hyperlipidemia, A-fib on Eliquis, CML, CHF, Parkinson's disease, stroke, [facility] further management of CML.  INTERVAL HISTORY- Patient was seen today accompanied by his wife for toxicity check for Dasatinib. He is feeling well overall.  He is tolerating Dasatinib 20 mg daily.  He denies any monitors his weight and leg swelling and if needed  takes Lasix 20 mg.  He has been requiring about once every 2 weeks.  His back pain has improved which has improved his mobility.  I have reviewed his chart and materials related to his cancer extensively and collaborated history with the patient. 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 Lonia Love for hyperleukocytosis. -CBC on 07/23/2020 shows white count 148.3 with differential showing neutrophils, bands, promyelocytes, myelocytes and occasional blasts.  Platelet count was 138 and hemoglobin 12.9. -CBC on 03/05/2020 shows white count 15.2 with hemoglobin 15.4 and normal platelet count. -CBC in November 2020 showed white count 10.3 with normal hemoglobin and platelets.  -Dasatinib 70 mg daily started on 08/13/2020.  Held on 09/09/2020 due to fluid retention. -Bone marrow biopsy on 08/13/2020 with karyotype 36, XY,t(9;22), hypercellular bone marrow with morphological features consistent with CML. -2D echo on 09/10/2020 at [facility] showed normal LV size and function with EF 55%.  Mild concentric hypertrophy.  Normal right ventricle size with function.  Mild to moderate MR.  Trivial pericardial effusion.  No pericardial tamponade. -Dasatinib 20 mg daily started on 10/02/2020.   08/05/2020 Initial Diagnosis   CML (chronic myelocytic leukemia) Kevin Love)     MEDICAL HISTORY:  Past Medical History:  Diagnosis Date   Anemia    Atrial fibrillation (HCC)    on Eliquis   CHF (congestive heart failure) (HCC)    Chronic Leukemia    Glaucoma    Hyperlipidemia    Hypertension    Parkinson disease 07/25/2020   Stroke [facility])     SURGICAL HISTORY: Past Surgical History:  Procedure Laterality Date   BACK SURGERY     COLONOSCOPY WITH PROPOFOL N/A 06/03/2022   Procedure: COLONOSCOPY WITH PROPOFOL;  Surgeon: Lesly Rubenstein, MD;  Location: [facility] ENDOSCOPY;  Service: Endoscopy;  Laterality: N/A;   ESOPHAGOGASTRODUODENOSCOPY (EGD) WITH PROPOFOL N/A 06/03/2022   Procedure: ESOPHAGOGASTRODUODENOSCOPY (EGD) WITH PROPOFOL;  Surgeon: Lesly Rubenstein, MD;  Location: [facility] ENDOSCOPY;  Service: Endoscopy;  Laterality: N/A;   GIVENS CAPSULE STUDY N/A 06/04/2022   Procedure: GIVENS CAPSULE STUDY;  Surgeon: Lesly Rubenstein, MD;  Location: [facility] ENDOSCOPY;  Service: Endoscopy;   Laterality: N/A;   toe removed Right     SOCIAL HISTORY: Social History   Socioeconomic History   Marital status: Married    Spouse name: Not on file   Number of children: Not on file   Years of education: Not on file   Highest education level: Not on file  Occupational History   Not on file  Tobacco Use   Smoking status: Former    Types: Cigarettes   Smokeless tobacco: Never  Substance and Sexual Activity   Alcohol use: Never   Drug use: Never   Sexual activity: Not Currently  Other Topics Concern   Not on file  Social History Narrative   Not on file   Social Determinants of Health   Financial Resource Strain: Low Risk  (08/22/2020)   Overall Financial Resource Strain (CARDIA)    Difficulty of Paying Living Expenses: Not hard at all  Food Insecurity: No Food Insecurity (08/22/2020)   Hunger Vital Sign    Worried About Running Out of Food in the Last Year: Never true    Ran Out of Food in the Last Year: Never true  Transportation Needs: No Transportation Needs (08/22/2020)  PRAPARE - Hydrologist (Medical): No    Lack of Transportation (Non-Medical): No  Physical Activity: Insufficiently Active (08/22/2020)   Exercise Vital Sign    Days of Exercise per Week: 7 days    Minutes of Exercise per Session: 10 min  Stress: No Stress Concern Present (08/22/2020)   [place]    Feeling of Stress : Not at all  Social Connections: Moderately Integrated (08/22/2020)   Social Connection and Isolation Panel [NHANES]    Frequency of Communication with Friends and Family: Three times a week    Frequency of Social Gatherings with Friends and Family: Once a week    Attends Religious Services: More than 4 times per year    Active Member of [employer] or Organizations: No    Attends Archivist Meetings: Never    Marital Status: Married  Human resources officer Violence: Not At Risk (08/22/2020)    Humiliation, Afraid, Rape, and Kick questionnaire    Fear of Current or Ex-Partner: No    Emotionally Abused: No    Physically Abused: No    Sexually Abused: No    FAMILY HISTORY: History reviewed. No pertinent family history.  ALLERGIES:  is allergic to penicillins.  MEDICATIONS:  Current Outpatient Medications  Medication Sig Dispense Refill   acetaminophen (TYLENOL) 500 MG tablet Take 1,000 mg by mouth every 6 (six) hours as needed for moderate pain.     apixaban (ELIQUIS) 5 MG TABS tablet Take 1 tablet (5 mg total) by mouth 2 (two) times daily. 60 tablet 0   atorvastatin (LIPITOR) 80 MG tablet Take 1 tablet (80 mg total) by mouth daily. 30 tablet 0   clonazePAM (KLONOPIN) 0.5 MG tablet Take 0.5 mg by mouth daily.     cyanocobalamin 1000 MCG tablet Take 1,000 mcg by mouth daily.     dasatinib (SPRYCEL) 20 MG tablet Take 1 tablet (20 mg total) by mouth daily. 30 tablet 11   furosemide (LASIX) 20 MG tablet Take 1 tablet (20 mg total) by mouth daily as needed (take daily in the morning as needed for ankle swelling). 30 tablet 1   latanoprost (XALATAN) 0.005 % ophthalmic solution Place 1 drop into the right eye at bedtime.     lisinopril (ZESTRIL) 20 MG tablet Take 20 mg by mouth daily.     memantine (NAMENDA) 10 MG tablet Take 10 mg by mouth 2 (two) times daily.     Multiple Vitamins-Minerals (CENTRUM SILVER 50+MEN PO) Take 1 tablet by mouth daily.     promethazine-dextromethorphan (PROMETHAZINE-DM) 6.25-15 MG/5ML syrup Take 5 mLs by mouth at bedtime as needed.     tamsulosin (FLOMAX) 0.4 MG CAPS capsule Take 0.4 mg by mouth daily.     carbidopa-levodopa (SINEMET IR) 25-100 MG tablet Take 1 tablet by mouth daily for 6 days. (Patient not taking: Reported on 06/25/2022) 6 tablet 0   predniSONE (DELTASONE) 5 MG tablet 6 DAY TAPER, TAKE AS DIRECTED WITH FOOD (Patient not taking: Reported on 08/17/2022)     No current facility-administered medications for this visit.    REVIEW OF  SYSTEMS:   Pertinent information mentioned in HPI All other systems were reviewed with the patient and are negative.  PHYSICAL EXAMINATION: ECOG PERFORMANCE STATUS: 1 - Symptomatic but completely ambulatory  Vitals:   08/17/22 1002  BP: (!) 135/54  Pulse: 68  Resp: 20  Temp: 97.6 F (36.4 C)  SpO2: 100%   Filed Weights  08/17/22 1002  Weight: 156 lb 12.8 oz (71.1 kg)    GENERAL:alert, no distress and comfortable SKIN: skin color, texture, turgor are normal, no rashes or significant lesions EYES: normal, conjunctiva are pink and non-injected, sclera clear OROPHARYNX:no exudate, no erythema and lips, buccal mucosa, and tongue normal  NECK: supple, thyroid normal size, non-tender, without nodularity LYMPH:  no palpable lymphadenopathy in the cervical, axillary or inguinal LUNGS: clear to auscultation and percussion with normal breathing effort HEART: regular rate & rhythm and no murmurs and no lower extremity edema ABDOMEN:abdomen soft, non-tender and normal bowel sounds Musculoskeletal:no cyanosis of digits and no clubbing  PSYCH: alert & oriented x 3 with fluent speech NEURO: no focal motor/sensory deficits  LABORATORY DATA:  I have reviewed the data as listed Lab Results  Component Value Date   WBC 5.0 07/27/2022   HGB 9.3 (L) 07/27/2022   HCT 31.0 (L) 07/27/2022   MCV 90.4 07/27/2022   PLT 211 07/27/2022   Recent Labs    06/01/22 1856 06/02/22 0827 06/03/22 0453 06/04/22 1332 06/10/22 1413  NA  --  141 143 139 141  Kevin  --  3.8 3.9 4.1 4.3  CL  --  111 112* 108 111  CO2  --  _0 GLUCOSE  --  122* 109* 113* 135*  BUN  --  _1 CREATININE  --  1.20 1.18 1.16 1.26*  CALCIUM  --  8.6* 8.1* 8.4* 8.9  GFRNONAA  --  >60 >60 >60 59*  PROT 6.3* 6.6  --   --  6.6  ALBUMIN 3.4* 3.5  --   --  3.5  AST 23 25  --   --  22  ALT 30 35  --   --  7  ALKPHOS 108 120  --   --  123  BILITOT 0.5 0.9  --   --  0.8  BILIDIR <0.1  --   --   --   --   IBILI  NOT CALCULATED  --   --   --   --     RADIOGRAPHIC STUDIES: I have personally reviewed the radiological images as listed and agreed with the findings in the report. No results found.

## 2022-09-01 ENCOUNTER — Telehealth: Payer: Self-pay

## 2022-09-01 ENCOUNTER — Encounter: Payer: Self-pay | Admitting: Hematology

## 2022-09-01 ENCOUNTER — Other Ambulatory Visit (HOSPITAL_COMMUNITY): Payer: Self-pay

## 2022-09-01 NOTE — Telephone Encounter (Signed)
Left VM for patient to call back to initiate re-enrollment.   [name], [facility] [place]  [phone] (phone) [phone] (fax) 09/01/2022 1:41 PM

## 2022-09-01 NOTE — Telephone Encounter (Signed)
Oral Oncology Patient Advocate Encounter   Received notification that patient is due for re-enrollment for assistance for [employer] through [employer].   Re-enrollment process has been initiated and will be submitted upon completion of necessary documents.  [employer]' phone number [phone].   I will continue to follow until final determination.  [name], [facility] Patient [place]  [phone] (phone) [phone] (fax) 09/01/2022 11:03 AM

## 2022-09-02 NOTE — Telephone Encounter (Signed)
Oral Oncology Patient Advocate Encounter  Reached out and spoke with patient regarding PAP paperwork, explained that I would send it to their preferred email via DocuSign.   Confirmed email address: [email or web address].    Patient expressed understanding and consent.  Will follow up once paperwork has been signed and returned.   [name], [facility] Patient [place]  [phone] (phone) [phone] (fax) 09/02/2022 11:00 AM

## 2022-09-02 NOTE — Telephone Encounter (Signed)
Received patient signatures. Will submit for processing once I have MD signatures.  [name], [facility] Patient [place]  [phone] (phone) [phone] (fax) 09/02/2022 11:28 AM

## 2022-09-18 ENCOUNTER — Inpatient Hospital Stay: Payer: Medicare Other | Attending: Internal Medicine

## 2022-09-18 DIAGNOSIS — Z87891 Personal history of nicotine dependence: Secondary | ICD-10-CM | POA: Diagnosis not present

## 2022-09-18 DIAGNOSIS — R39198 Other difficulties with micturition: Secondary | ICD-10-CM | POA: Insufficient documentation

## 2022-09-18 DIAGNOSIS — D500 Iron deficiency anemia secondary to blood loss (chronic): Secondary | ICD-10-CM

## 2022-09-18 DIAGNOSIS — R251 Tremor, unspecified: Secondary | ICD-10-CM | POA: Diagnosis not present

## 2022-09-18 DIAGNOSIS — Z79899 Other long term (current) drug therapy: Secondary | ICD-10-CM | POA: Insufficient documentation

## 2022-09-18 DIAGNOSIS — I4891 Unspecified atrial fibrillation: Secondary | ICD-10-CM | POA: Diagnosis not present

## 2022-09-18 DIAGNOSIS — C9210 Chronic myeloid leukemia, BCR/ABL-positive, not having achieved remission: Secondary | ICD-10-CM | POA: Diagnosis not present

## 2022-09-18 DIAGNOSIS — R413 Other amnesia: Secondary | ICD-10-CM | POA: Insufficient documentation

## 2022-09-18 DIAGNOSIS — D649 Anemia, unspecified: Secondary | ICD-10-CM | POA: Insufficient documentation

## 2022-09-18 DIAGNOSIS — Z7901 Long term (current) use of anticoagulants: Secondary | ICD-10-CM | POA: Insufficient documentation

## 2022-09-18 DIAGNOSIS — Z862 Personal history of diseases of the blood and blood-forming organs and certain disorders involving the immune mechanism: Secondary | ICD-10-CM | POA: Insufficient documentation

## 2022-09-18 LAB — CBC WITH DIFFERENTIAL/PLATELET
Abs Immature Granulocytes: 0.02 10*3/uL (ref 0.00–0.07)
Basophils Absolute: 0.1 10*3/uL (ref 0.0–0.1)
Basophils Relative: 1 %
Eosinophils Absolute: 0.3 10*3/uL (ref 0.0–0.5)
Eosinophils Relative: 4 %
HCT: 37 % — ABNORMAL LOW (ref 39.0–52.0)
Hemoglobin: 11.9 g/dL — ABNORMAL LOW (ref 13.0–17.0)
Immature Granulocytes: 0 %
Lymphocytes Relative: 28 %
Lymphs Abs: 1.6 10*3/uL (ref 0.7–4.0)
MCH: 29.3 pg (ref 26.0–34.0)
MCHC: 32.2 g/dL (ref 30.0–36.0)
MCV: 91.1 fL (ref 80.0–100.0)
Monocytes Absolute: 0.6 10*3/uL (ref 0.1–1.0)
Monocytes Relative: 11 %
Neutro Abs: 3.2 10*3/uL (ref 1.7–7.7)
Neutrophils Relative %: 56 %
Platelets: 191 10*3/uL (ref 150–400)
RBC: 4.06 MIL/uL — ABNORMAL LOW (ref 4.22–5.81)
RDW: 19.5 % — ABNORMAL HIGH (ref 11.5–15.5)
WBC: 5.6 10*3/uL (ref 4.0–10.5)
nRBC: 0 % (ref 0.0–0.2)

## 2022-09-18 LAB — IRON AND TIBC
Iron: 91 ug/dL (ref 45–182)
Saturation Ratios: 37 % (ref 17.9–39.5)
TIBC: 245 ug/dL — ABNORMAL LOW (ref 250–450)
UIBC: 154 ug/dL

## 2022-09-18 LAB — FERRITIN: Ferritin: 65 ng/mL (ref 24–336)

## 2022-09-24 ENCOUNTER — Other Ambulatory Visit: Payer: Medicare Other

## 2022-09-25 ENCOUNTER — Inpatient Hospital Stay: Payer: Medicare Other | Admitting: Internal Medicine

## 2022-09-28 LAB — BCR-ABL1, CML/ALL, PCR, QUANT: Interpretation (BCRAL):: NEGATIVE

## 2022-10-01 ENCOUNTER — Ambulatory Visit: Payer: Medicare Other | Admitting: Hematology

## 2022-10-02 ENCOUNTER — Inpatient Hospital Stay (HOSPITAL_BASED_OUTPATIENT_CLINIC_OR_DEPARTMENT_OTHER): Payer: Medicare Other | Admitting: Internal Medicine

## 2022-10-02 ENCOUNTER — Encounter: Payer: Self-pay | Admitting: Internal Medicine

## 2022-10-02 VITALS — BP 137/55 | HR 63 | Temp 98.6°F | Resp 20 | Wt 157.0 lb

## 2022-10-02 DIAGNOSIS — D500 Iron deficiency anemia secondary to blood loss (chronic): Secondary | ICD-10-CM

## 2022-10-02 DIAGNOSIS — Z5111 Encounter for antineoplastic chemotherapy: Secondary | ICD-10-CM

## 2022-10-02 DIAGNOSIS — C9210 Chronic myeloid leukemia, BCR/ABL-positive, not having achieved remission: Secondary | ICD-10-CM | POA: Diagnosis not present

## 2022-10-02 NOTE — Progress Notes (Signed)
[place] NOTE  Patient Care Team: Baxter Hire, MD as PCP - General (Internal Medicine) Derek Jack, MD as Consulting Physician (Hematology)  CURRENT TREATMENT-  Dasatinib 70 mg started on 08/13/20.  Dose reduced to 20 mg on 10/02/2020 due to fluid retention  ASSESSMENT & PLAN:  Kevin Love 76 y.o. male with pmh of hypertension, hyperlipidemia, A-fib on Eliquis, CML, CHF, Parkinson's disease, stroke, was seen at [facility] hematology on 08/17/2022 as a transfer of care from Dr. Delton Coombes at [facility] due to close proximity to home for further management of CML.  #CML -Diagnosed 08/02/2020 when he presented with leukocytosis. -Marrow biopsy done on 08/13/2020 showed karyotype 30, XY t(9;22).  Hypercellular marrow with morphological features consistent with CML.  -Started on Dasatinib 70 mg on 08/13/2020.  Held on 09/09/2020 due to fluid retention.  Echo done on 09/10/2020 at [facility] showed normal LV size with EF 55%.  Mild concentric hypertrophy. mild to moderate MR.  -Dose reduced Dasatinib to 20 mg daily on 10/02/2020.  He has been tolerating it well.  Uses Lasix 20 mg as needed once every 2 weeks. -BCR/ABL PCR transcript  08/13/2021 0.0932 11/06/2021   0.0917 02/17/2022     0.1019 06/10/2022   0.2722 09/18/2022  <0.0032  BCR/ABL transcript by PCR negative <0.0032%.  Will continue with Dasatinib 20 mg daily.  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  Endoscopy and capsule study unremarkable. -Iron panel from Dec 2023 normal.  Hemoglobin improved to 11.9 from 9.3.  #CVA/A-fib: - Continue Eliquis twice daily.   # Memory problems: - Continue memantine 10 mg 2 tablets daily.   # Difficulty urination: - Continue Flomax daily.   # ?Parkinson's disease: #Right arm tremor - Patient follows  with Dr. Starleen Blue of neurology who tapered off carbidopa due to low suspicion for Parkinson's disease.  His right arm tremor was deemed benign.  Was started on gabapentin which did not help.  Started on nortriptyline 2 days ago.   He is stumbling while walking and has some near falls.   Orders Placed This Encounter  Procedures   CBC with Differential/Platelet    Standing Status:   Future    Standing Expiration Date:   10/02/2023   Comprehensive metabolic panel    Standing Status:   Future    Standing Expiration Date:   10/02/2023   BCR-ABL1, CML/ALL, PCR, QUANT    Standing Status:   Future    Standing Expiration Date:   10/03/2023    RTC in 3 months for MD visit, labs 2 weeks prior.  The total time spent in the appointment was 30 minutes encounter with patients including review of chart and various tests results, discussions about plan of care and coordination of care plan   All questions were answered. The patient knows to call the clinic with any problems, questions or concerns. No barriers to learning was detected.  Jane Canary, MD 12/15/202310:52 AM   HISTORY OF PRESENTING ILLNESS:  Kevin Love 76 y.o. male with pmh of hypertension, hyperlipidemia, A-fib on Eliquis, CML, CHF, Parkinson's disease, stroke, was seen at [facility] hematology on 08/17/2022 as a transfer of care from Dr. Delton Coombes at [facility] due to close proximity to home for further management of CML.  INTERVAL HISTORY- Patient was seen today accompanied by his wife for toxicity check for Dasatinib. He is  feeling well overall.  He is tolerating Dasatinib 20 mg daily.  Denies any leg swelling.  Has not used Lasix for past 1 month.  His right hand tremor has worsened since he has been tapered off carbidopa.  Gabapentin did not help.  Was recently started on nortriptyline by his neurologist.  His back pain has improved which has improved his mobility.   I have reviewed his chart and materials related to his cancer  extensively and collaborated history with the patient. 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 is evaluated at the request of Dr. Lonia Mad for hyperleukocytosis. -CBC on 07/23/2020 shows white count 148.3 with differential showing neutrophils, bands, promyelocytes, myelocytes and occasional blasts.  Platelet count was 138 and hemoglobin 12.9. -CBC on 03/05/2020 shows white count 15.2 with hemoglobin 15.4 and normal platelet count. -CBC in November 2020 showed white count 10.3 with normal hemoglobin and platelets.  -Dasatinib 70 mg daily started on 08/13/2020.  Held on 09/09/2020 due to fluid retention. -Bone marrow biopsy on 08/13/2020 with karyotype 24, XY,t(9;22), hypercellular bone marrow with morphological features consistent with CML. -2D echo on 09/10/2020 at [facility] showed normal LV size and function with EF 55%.  Mild concentric hypertrophy.  Normal right ventricle size with function.  Mild to moderate MR.  Trivial pericardial effusion.  No pericardial tamponade. -Dasatinib 20 mg daily started on 10/02/2020.   08/05/2020 Initial Diagnosis   CML (chronic myelocytic leukemia) [facility])     MEDICAL HISTORY:  Past Medical History:  Diagnosis Date   Anemia    Atrial fibrillation (HCC)    on Eliquis   CHF (congestive heart failure) (HCC)    Chronic Leukemia    Glaucoma    Hyperlipidemia    Hypertension    Parkinson disease 07/25/2020   Stroke [facility])     SURGICAL HISTORY: Past Surgical History:  Procedure Laterality Date   BACK SURGERY     COLONOSCOPY WITH PROPOFOL N/A 06/03/2022   Procedure: COLONOSCOPY WITH PROPOFOL;  Surgeon: Lesly Rubenstein, MD;  Location: [facility] ENDOSCOPY;  Service: Endoscopy;  Laterality: N/A;   ESOPHAGOGASTRODUODENOSCOPY (EGD) WITH PROPOFOL N/A 06/03/2022   Procedure: ESOPHAGOGASTRODUODENOSCOPY (EGD) WITH  PROPOFOL;  Surgeon: Lesly Rubenstein, MD;  Location: [facility] ENDOSCOPY;  Service: Endoscopy;  Laterality: N/A;   GIVENS CAPSULE STUDY N/A 06/04/2022   Procedure: GIVENS CAPSULE STUDY;  Surgeon: Lesly Rubenstein, MD;  Location: [facility] ENDOSCOPY;  Service: Endoscopy;  Laterality: N/A;   toe removed Right     SOCIAL HISTORY: Social History   Socioeconomic History   Marital status: Married    Spouse name: Not on file   Number of children: Not on file   Years of education: Not on file   Highest education level: Not on file  Occupational History   Not on file  Tobacco Use   Smoking status: Former    Types: Cigarettes   Smokeless tobacco: Never  Substance and Sexual Activity   Alcohol use: Never   Drug use: Never   Sexual activity: Not Currently  Other Topics Concern   Not on file  Social History Narrative   Not on file   Social Determinants of Health   Financial Resource Strain: Low Risk  (08/22/2020)   Overall Financial Resource Strain (CARDIA)    Difficulty of Paying Living Expenses: Not hard at all  Food Insecurity: No Food Insecurity (08/22/2020)  Hunger Vital Sign    Worried About Running Out of Food in the Last Year: Never true    Ran Out of Food in the Last Year: Never true  Transportation Needs: No Transportation Needs (08/22/2020)   PRAPARE - Hydrologist (Medical): No    Lack of Transportation (Non-Medical): No  Physical Activity: Insufficiently Active (08/22/2020)   Exercise Vital Sign    Days of Exercise per Week: 7 days    Minutes of Exercise per Session: 10 min  Stress: No Stress Concern Present (08/22/2020)   [place]    Feeling of Stress : Not at all  Social Connections: Moderately Integrated (08/22/2020)   Social Connection and Isolation Panel [NHANES]    Frequency of Communication with Friends and Family: Three times a week    Frequency of Social Gatherings with  Friends and Family: Once a week    Attends Religious Services: More than 4 times per year    Active Member of [employer] or Organizations: No    Attends Archivist Meetings: Never    Marital Status: Married  Human resources officer Violence: Not At Risk (08/22/2020)   Humiliation, Afraid, Rape, and Kick questionnaire    Fear of Current or Ex-Partner: No    Emotionally Abused: No    Physically Abused: No    Sexually Abused: No    FAMILY HISTORY: No family history on file.  ALLERGIES:  is allergic to penicillins.  MEDICATIONS:  Current Outpatient Medications  Medication Sig Dispense Refill   acetaminophen (TYLENOL) 500 MG tablet Take 1,000 mg by mouth every 6 (six) hours as needed for moderate pain.     apixaban (ELIQUIS) 5 MG TABS tablet Take 1 tablet (5 mg total) by mouth 2 (two) times daily. 60 tablet 0   atorvastatin (LIPITOR) 80 MG tablet Take 1 tablet (80 mg total) by mouth daily. 30 tablet 0   carbidopa-levodopa (SINEMET IR) 25-100 MG tablet Take 1 tablet by mouth daily for 6 days. (Patient not taking: Reported on 06/25/2022) 6 tablet 0   clonazePAM (KLONOPIN) 0.5 MG tablet Take 0.5 mg by mouth daily.     cyanocobalamin 1000 MCG tablet Take 1,000 mcg by mouth daily.     dasatinib (SPRYCEL) 20 MG tablet Take 1 tablet (20 mg total) by mouth daily. 30 tablet 11   furosemide (LASIX) 20 MG tablet Take 1 tablet (20 mg total) by mouth daily as needed (take daily in the morning as needed for ankle swelling). 30 tablet 1   latanoprost (XALATAN) 0.005 % ophthalmic solution Place 1 drop into the right eye at bedtime.     lisinopril (ZESTRIL) 20 MG tablet Take 20 mg by mouth daily.     memantine (NAMENDA) 10 MG tablet Take 10 mg by mouth 2 (two) times daily.     Multiple Vitamins-Minerals (CENTRUM SILVER 50+MEN PO) Take 1 tablet by mouth daily.     predniSONE (DELTASONE) 5 MG tablet 6 DAY TAPER, TAKE AS DIRECTED WITH FOOD (Patient not taking: Reported on 08/17/2022)     tamsulosin (FLOMAX) 0.4  MG CAPS capsule Take 0.4 mg by mouth daily.     No current facility-administered medications for this visit.    REVIEW OF SYSTEMS:   Pertinent information mentioned in HPI All other systems were reviewed with the patient and are negative.  PHYSICAL EXAMINATION: ECOG PERFORMANCE STATUS: 1 - Symptomatic but completely ambulatory  There were no vitals filed for this  visit.  There were no vitals filed for this visit.   GENERAL:alert, no distress and comfortable SKIN: skin color, texture, turgor are normal, no rashes or significant lesions EYES: normal, conjunctiva are pink and non-injected, sclera clear OROPHARYNX:no exudate, no erythema and lips, buccal mucosa, and tongue normal  NECK: supple, thyroid normal size, non-tender, without nodularity LYMPH:  no palpable lymphadenopathy in the cervical, axillary or inguinal LUNGS: clear to auscultation and percussion with normal breathing effort HEART: regular rate & rhythm and no murmurs and no lower extremity edema ABDOMEN:abdomen soft, non-tender and normal bowel sounds Musculoskeletal:no cyanosis of digits and no clubbing  PSYCH: alert & oriented x 3 with fluent speech NEURO: no focal motor/sensory deficits  LABORATORY DATA:  I have reviewed the data as listed Lab Results  Component Value Date   WBC 5.6 09/18/2022   HGB 11.9 (L) 09/18/2022   HCT 37.0 (L) 09/18/2022   MCV 91.1 09/18/2022   PLT 191 09/18/2022   Recent Labs    06/01/22 1856 06/02/22 0827 06/03/22 0453 06/04/22 1332 06/10/22 1413  NA  --  141 143 139 141  K  --  3.8 3.9 4.1 4.3  CL  --  111 112* 108 111  CO2  --  _0 GLUCOSE  --  122* 109* 113* 135*  BUN  --  _1 CREATININE  --  1.20 1.18 1.16 1.26*  CALCIUM  --  8.6* 8.1* 8.4* 8.9  GFRNONAA  --  >60 >60 >60 59*  PROT 6.3* 6.6  --   --  6.6  ALBUMIN 3.4* 3.5  --   --  3.5  AST 23 25  --   --  22  ALT 30 35  --   --  7  ALKPHOS 108 120  --   --  123  BILITOT 0.5 0.9  --   --  0.8   BILIDIR <0.1  --   --   --   --   IBILI NOT CALCULATED  --   --   --   --     RADIOGRAPHIC STUDIES: I have personally reviewed the radiological images as listed and agreed with the findings in the report. No results found.

## 2022-10-02 NOTE — Telephone Encounter (Signed)
Oral Oncology Patient Advocate Encounter   Submitted application for assistance for Sprycel to [employer].   Application submitted via e-fax to [phone]   [employer]' phone number [phone].   I will continue to check the status until final determination.   [name], [facility] Patient [place]  [phone] (phone) [phone] (fax) 10/02/2022 3:16 PM

## 2022-10-14 NOTE — Telephone Encounter (Signed)
Called to check status of application. [employer] said they were having issues with their faxes and did not receive application. I have resent application for renewal to three different fax numbers provided to ensure deliver.   Fax Numbers: [phone]; [phone]; [phone]  I will continue to follow and update until final determination.   [name], [facility] Patient [place]  [phone] (phone) [phone] (fax) 10/14/2022 11:39 AM

## 2022-10-30 NOTE — Telephone Encounter (Signed)
Called back to check status as recommended by earlier representative. Informed that application was almost done with Benefits Verification. I was able to confirm with the representative that the co-pay without assistance was unaffordable. Representative then informed me, that since they were able to confirm financial hardship, they would send application for Final Review upon Benefits Verification completion.  I called the patient and updated them on the status.   I will continue to follow and update until final determination.   [name], [facility] [facility] Patient [place] Love  [phone] (phone) [phone] (fax) 10/30/2022 3:37 PM

## 2022-10-30 NOTE — Telephone Encounter (Signed)
Called to check status of application. Representative Raven informed me that she sent the application for review as STAT and advised me to call back this afternoon to verify it had been assigned and in review. I will continue to follow and update until final determination.   [name], [facility] Patient [place]  [phone] (phone) [phone] (fax) 10/30/2022 9:21 AM

## 2022-11-02 ENCOUNTER — Other Ambulatory Visit (HOSPITAL_COMMUNITY): Payer: Self-pay

## 2022-11-02 ENCOUNTER — Telehealth: Payer: Self-pay

## 2022-11-02 NOTE — Telephone Encounter (Signed)
Oral Oncology Patient Advocate Encounter  Prior Authorization for Sprycel has been approved.    PA# [id number]  Effective dates: 11/02/22 through 05/03/23  Patients co-pay is $1,548.28.    [name], [place] Patient [place]  [phone] (phone) [phone] (fax) 11/02/2022 9:03 AM

## 2022-11-02 NOTE — Telephone Encounter (Signed)
Oral Oncology Patient Advocate Encounter   Received notification that prior authorization for Sprycel is due for renewal.   PA submitted on 11/02/22  Key [id number]  Status is pending     [name], [place] Patient [place]  [phone] (phone) [phone] (fax) 11/02/2022 8:21 AM

## 2022-11-06 ENCOUNTER — Other Ambulatory Visit (HOSPITAL_COMMUNITY): Payer: Self-pay

## 2022-11-06 NOTE — Telephone Encounter (Signed)
Called and verified that [employer] received signed prescription. Informed that Case Manager had been notified. I will continue to follow and update until final determination.   [name], [place] [facility] Patient [place]  [phone] (phone) [phone] (fax) 11/06/2022 1:17 PM

## 2022-11-06 NOTE — Telephone Encounter (Signed)
Received request for rx from [facility] Patient [place]. Obtained MD signature and faxed to [phone]  [employer] Patient Assistance Phone Number for follow-up is [phone]  I will continue to follow and update until final determination.   [name], [facility] Patient [place]  [phone] (phone) [phone] (fax) 11/06/2022 9:32 AM

## 2022-11-12 ENCOUNTER — Encounter: Payer: Self-pay | Admitting: Hospice and Palliative Medicine

## 2022-11-12 ENCOUNTER — Ambulatory Visit (HOSPITAL_BASED_OUTPATIENT_CLINIC_OR_DEPARTMENT_OTHER): Payer: Medicare Other | Admitting: Hospice and Palliative Medicine

## 2022-11-12 ENCOUNTER — Telehealth: Payer: Self-pay

## 2022-11-12 ENCOUNTER — Other Ambulatory Visit: Payer: Self-pay

## 2022-11-12 ENCOUNTER — Inpatient Hospital Stay: Payer: Medicare Other | Attending: Internal Medicine

## 2022-11-12 VITALS — BP 133/61 | HR 76 | Temp 98.7°F | Wt 154.0 lb

## 2022-11-12 DIAGNOSIS — R531 Weakness: Secondary | ICD-10-CM | POA: Insufficient documentation

## 2022-11-12 DIAGNOSIS — C9210 Chronic myeloid leukemia, BCR/ABL-positive, not having achieved remission: Secondary | ICD-10-CM | POA: Diagnosis not present

## 2022-11-12 DIAGNOSIS — Z8673 Personal history of transient ischemic attack (TIA), and cerebral infarction without residual deficits: Secondary | ICD-10-CM | POA: Diagnosis not present

## 2022-11-12 DIAGNOSIS — G20A1 Parkinson's disease without dyskinesia, without mention of fluctuations: Secondary | ICD-10-CM | POA: Insufficient documentation

## 2022-11-12 DIAGNOSIS — D72829 Elevated white blood cell count, unspecified: Secondary | ICD-10-CM

## 2022-11-12 DIAGNOSIS — R5383 Other fatigue: Secondary | ICD-10-CM | POA: Diagnosis present

## 2022-11-12 LAB — CBC WITH DIFFERENTIAL/PLATELET
Abs Immature Granulocytes: 0.02 10*3/uL (ref 0.00–0.07)
Basophils Absolute: 0.1 10*3/uL (ref 0.0–0.1)
Basophils Relative: 1 %
Eosinophils Absolute: 0.2 10*3/uL (ref 0.0–0.5)
Eosinophils Relative: 3 %
HCT: 41.4 % (ref 39.0–52.0)
Hemoglobin: 13.2 g/dL (ref 13.0–17.0)
Immature Granulocytes: 0 %
Lymphocytes Relative: 23 %
Lymphs Abs: 1.4 10*3/uL (ref 0.7–4.0)
MCH: 30.7 pg (ref 26.0–34.0)
MCHC: 31.9 g/dL (ref 30.0–36.0)
MCV: 96.3 fL (ref 80.0–100.0)
Monocytes Absolute: 0.6 10*3/uL (ref 0.1–1.0)
Monocytes Relative: 10 %
Neutro Abs: 3.6 10*3/uL (ref 1.7–7.7)
Neutrophils Relative %: 63 %
Platelets: 218 10*3/uL (ref 150–400)
RBC: 4.3 MIL/uL (ref 4.22–5.81)
RDW: 16.6 % — ABNORMAL HIGH (ref 11.5–15.5)
WBC: 5.8 10*3/uL (ref 4.0–10.5)
nRBC: 0 % (ref 0.0–0.2)

## 2022-11-12 LAB — COMPREHENSIVE METABOLIC PANEL
ALT: 29 U/L (ref 0–44)
AST: 27 U/L (ref 15–41)
Albumin: 4.1 g/dL (ref 3.5–5.0)
Alkaline Phosphatase: 76 U/L (ref 38–126)
Anion gap: 8 (ref 5–15)
BUN: 19 mg/dL (ref 8–23)
CO2: 27 mmol/L (ref 22–32)
Calcium: 9 mg/dL (ref 8.9–10.3)
Chloride: 104 mmol/L (ref 98–111)
Creatinine, Ser: 0.91 mg/dL (ref 0.61–1.24)
GFR, Estimated: >60 mL/min (ref >60)
Glucose, Bld: 118 mg/dL — ABNORMAL HIGH (ref 70–99)
Potassium: 4.1 mmol/L (ref 3.5–5.1)
Sodium: 139 mmol/L (ref 135–145)
Total Bilirubin: 0.6 mg/dL (ref 0.3–1.2)
Total Protein: 7 g/dL (ref 6.5–8.1)

## 2022-11-12 NOTE — Telephone Encounter (Signed)
Family member left message stating that the patient is losing weight and not feeling well,  listless and more tired than usual. Her call back number is [phone]

## 2022-11-12 NOTE — Telephone Encounter (Signed)
Called to check status of application. Informed it was still under review by Case Manager. I once again let them know patient has been out of medication and to please expedite review process. I will continue to follow and update until final determination.   [name], [place] [facility] Patient [place]  [phone] (phone) [phone] (fax) 11/12/2022 2:59 PM

## 2022-11-12 NOTE — Progress Notes (Signed)
Symptom Management [place] at [facility] Telephone:([phone] Fax:([phone]) [phone]  Patient Care Team: Baxter Hire, MD as PCP - General (Internal Medicine) Derek Jack, MD as Consulting Physician (Hematology)   NAME OF PATIENT: Kevin Love  [id number]  08-27-1946   DATE OF VISIT: 11/12/22  REASON FOR CONSULT: Kevin Love is a 77 y.o. male with multiple medical problems including hypertension, hyperlipidemia, A-fib on Eliquis, history of CVA, history of diastolic CHF, Parkinson's disease, and CML.  CML was diagnosed in October 2021.  Patient has been on treatment with dose reduced Dasatinib.  He has been managed on furosemide due to fluid retention.  INTERVAL HISTORY: Patient last saw Dr. Darrall Dears on 10/02/2022 at which time patient appeared to be doing reasonably well.  Plan was to continue dose reduced Dasatinib.  Patient presents to clinic for evaluation of fatigue/weakness.  Patient and wife report 2 to 3 weeks of fatigue, weakness, and tremors.  They deny fever or chills.  No cough, congestion, shortness of breath.  No lower extremity swelling.  No chest pain.  No GI or GU symptoms.  Patient says that he ran out of the Dasatinib approximately 2 weeks ago.  Patient offers no further specific complaints today.   PAST MEDICAL HISTORY: Past Medical History:  Diagnosis Date   Anemia    Atrial fibrillation ([place])    on Eliquis   CHF (congestive heart failure) ([place])    Chronic Leukemia    Glaucoma    Hyperlipidemia    Hypertension    Parkinson disease 07/25/2020   Stroke [facility])     PAST SURGICAL HISTORY:  Past Surgical History:  Procedure Laterality Date   BACK SURGERY     COLONOSCOPY WITH PROPOFOL N/A 06/03/2022   Procedure: COLONOSCOPY WITH PROPOFOL;  Surgeon: Lesly Rubenstein, MD;  Location: [facility] ENDOSCOPY;  Service: Endoscopy;  Laterality: N/A;   ESOPHAGOGASTRODUODENOSCOPY (EGD) WITH PROPOFOL N/A 06/03/2022    Procedure: ESOPHAGOGASTRODUODENOSCOPY (EGD) WITH PROPOFOL;  Surgeon: Lesly Rubenstein, MD;  Location: [facility] ENDOSCOPY;  Service: Endoscopy;  Laterality: N/A;   GIVENS CAPSULE STUDY N/A 06/04/2022   Procedure: GIVENS CAPSULE STUDY;  Surgeon: Lesly Rubenstein, MD;  Location: [facility] ENDOSCOPY;  Service: Endoscopy;  Laterality: N/A;   toe removed Right     HEMATOLOGY/ONCOLOGY HISTORY:  Oncology History  CML (chronic myelocytic leukemia) ([place])  07/23/2020 Initial Diagnosis   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 Lonia Love for hyperleukocytosis. -CBC on 07/23/2020 shows white count 148.3 with differential showing neutrophils, bands, promyelocytes, myelocytes and occasional blasts.  Platelet count was 138 and hemoglobin 12.9. -CBC on 03/05/2020 shows white count 15.2 with hemoglobin 15.4 and normal platelet count. -CBC in November 2020 showed white count 10.3 with normal hemoglobin and platelets.  -Dasatinib 70 mg daily started on 08/13/2020.  Held on 09/09/2020 due to fluid retention. -Bone marrow biopsy on 08/13/2020 with karyotype 61, XY,t(9;22), hypercellular bone marrow with morphological features consistent with CML. -2D echo on 09/10/2020 at [facility] showed normal LV size and function with EF 55%.  Mild concentric hypertrophy.  Normal right ventricle size with function.  Mild to moderate MR.  Trivial pericardial effusion.  No pericardial tamponade. -Dasatinib 20 mg daily started on 10/02/2020.   08/05/2020 Initial Diagnosis   CML (chronic myelocytic leukemia) (HCC)     ALLERGIES:  is allergic to penicillins.  MEDICATIONS:  Current Outpatient Medications  Medication Sig Dispense Refill   acetaminophen (TYLENOL) 500 MG tablet  Take 1,000 mg by mouth every 6 (six) hours as needed for moderate pain.     apixaban (ELIQUIS) 5 MG TABS tablet Take 1 tablet (5 mg total) by mouth 2 (two) times daily. 60 tablet 0   atorvastatin (LIPITOR) 80 MG  tablet Take 1 tablet (80 mg total) by mouth daily. 30 tablet 0   carbidopa-levodopa (SINEMET IR) 25-100 MG tablet Take 1 tablet by mouth daily for 6 days. (Patient not taking: Reported on 06/25/2022) 6 tablet 0   clonazePAM (KLONOPIN) 0.5 MG tablet Take 0.5 mg by mouth daily.     cyanocobalamin 1000 MCG tablet Take 1,000 mcg by mouth daily.     dasatinib (SPRYCEL) 20 MG tablet Take 1 tablet (20 mg total) by mouth daily. 30 tablet 11   furosemide (LASIX) 20 MG tablet Take 1 tablet (20 mg total) by mouth daily as needed (take daily in the morning as needed for ankle swelling). 30 tablet 1   ketorolac (ACULAR) 0.4 % SOLN Apply to eye.     latanoprost (XALATAN) 0.005 % ophthalmic solution Place 1 drop into the right eye at bedtime.     lisinopril (ZESTRIL) 20 MG tablet Take 20 mg by mouth daily.     memantine (NAMENDA) 10 MG tablet Take 10 mg by mouth 2 (two) times daily.     Multiple Vitamins-Minerals (CENTRUM SILVER 50+MEN PO) Take 1 tablet by mouth daily.     nortriptyline (PAMELOR) 10 MG capsule Start Nortriptyline (Pamelor) 10 mg nightly for one week, then increase to 20 mg nightly and continue that dose.     predniSONE (DELTASONE) 5 MG tablet 6 DAY TAPER, TAKE AS DIRECTED WITH FOOD (Patient not taking: Reported on 08/17/2022)     tamsulosin (FLOMAX) 0.4 MG CAPS capsule Take 0.4 mg by mouth daily.     No current facility-administered medications for this visit.    VITAL SIGNS: There were no vitals taken for this visit. There were no vitals filed for this visit.  Estimated body mass index is 26.13 kg/m as calculated from the following:   Height as of 07/27/22: '5\' 5"'$  (1.651 m).   Weight as of 10/02/22: 157 lb (71.2 kg).  LABS: CBC:    Component Value Date/Time   WBC 5.6 09/18/2022 1110   HGB 11.9 (L) 09/18/2022 1110   HCT 37.0 (L) 09/18/2022 1110   PLT 191 09/18/2022 1110   MCV 91.1 09/18/2022 1110   NEUTROABS 3.2 09/18/2022 1110   LYMPHSABS 1.6 09/18/2022 1110   MONOABS 0.6  09/18/2022 1110   EOSABS 0.3 09/18/2022 1110   BASOSABS 0.1 09/18/2022 1110   Comprehensive Metabolic Panel:    Component Value Date/Time   NA 141 06/10/2022 1413   K 4.3 06/10/2022 1413   CL 111 06/10/2022 1413   CO2 25 06/10/2022 1413   BUN 18 06/10/2022 1413   CREATININE 1.26 (H) 06/10/2022 1413   GLUCOSE 135 (H) 06/10/2022 1413   CALCIUM 8.9 06/10/2022 1413   AST 22 06/10/2022 1413   ALT 7 06/10/2022 1413   ALKPHOS 123 06/10/2022 1413   BILITOT 0.8 06/10/2022 1413   PROT 6.6 06/10/2022 1413   ALBUMIN 3.5 06/10/2022 1413    RADIOGRAPHIC STUDIES: No results found.  PERFORMANCE STATUS (ECOG) : 1 - Symptomatic but completely ambulatory  Review of Systems Unless otherwise noted, a complete review of systems is negative.  Physical Exam General: NAD Cardiovascular: regular rate and rhythm Pulmonary: clear ant fields Abdomen: soft, nontender, + bowel sounds GU: no suprapubic tenderness  Extremities: no edema, no joint deformities Skin: no rashes Neurological: tremors, generalized weakness but otherwise nonfocal  IMPRESSION/PLAN: Fatigue/weakness -likely multifactorial, although wife tells me that symptoms seem to have originated with medication changes.  Patient is followed by neurology for Parkinson's with recent discontinuation of Sinemet and trial of gabapentin.  Patient is now on nortriptyline.  Labs grossly benign today.  I question if symptoms of fatigue and weakness are secondary to medication changes and suggested that patient follow-up with neurology.  Patient has scheduled follow-up with Dr. Melrose Nakayama in 2 weeks.  Wife requested referral for second opinion at [facility].  Case and plan discussed with Dr. Darrall Dears  Patient expressed understanding and was in agreement with this plan. He also understands that He can call clinic at any time with any questions, concerns, or complaints.   Thank you for allowing me to participate in the care of this very pleasant  patient.   Time Total: 15 minutes  Visit consisted of counseling and education dealing with the complex and emotionally intense issues of symptom management in the setting of serious illness.Greater than 50%  of this time was spent counseling and coordinating care related to the above assessment and plan.  Signed by: Altha Harm, PhD, NP-C

## 2022-11-13 NOTE — Telephone Encounter (Signed)
Received notification from [facility] that patient was under their threshold [place] expense for the year by $240 to qualify for program. They let me know that if patient could produce receipts for medication expenses that exceeded that they would overturn it. Patient will be getting receipt of a $300 medication that was just ordered so I can fax back to [phone]. I will continue to follow and update until final determination.   [name], [facility] Patient [place]  [phone] (phone) [phone] (fax) 11/13/2022 9:53 AM

## 2022-11-17 NOTE — Telephone Encounter (Signed)
Called patient to see if they had received receipt from Mail Order to submit to show they meet OOP expense for re-enrollment. Patient informed me they were getting their delivery on 11/18/22 and would drop off receipt then. I will submit once received and will continue to follow and update until final determination.   [name], [facility] Patient [place]  [phone] (phone) [phone] (fax) 11/17/2022 12:17 PM

## 2022-11-20 NOTE — Telephone Encounter (Signed)
Received receipt of medical expenses from patient and faxed to [facility] for review. I will continue to follow and update until final determination.   [name], [place] Patient [place]  [phone] (phone) [phone] (fax) 11/20/2022 11:07 AM

## 2022-11-23 NOTE — Telephone Encounter (Signed)
Oral Oncology Patient Advocate Encounter   Received notification re-enrollment for assistance for Sprycel through [employer] has been approved. Patient may continue to receive their medication at $0 from this program.    [employer]' phone number [phone].   Effective dates: 11/23/22 through 10/19/23  I have spoken to the patient.  [name], [place] [facility] Patient [place]  [phone] (phone) [phone] (fax) 11/23/2022 12:25 PM

## 2022-12-04 NOTE — Progress Notes (Signed)
Error

## 2022-12-24 ENCOUNTER — Telehealth: Payer: Self-pay

## 2022-12-24 NOTE — Telephone Encounter (Signed)
Received call from patient requesting assistance with refill of Sprycel through [place]. Patient called to place refill and was told they did not exist in the system. I called [employer] at [phone] and confirmed patient was in system and that order was being delivered today, 12/24/22.   I called the patient back and informed them that they were in [place] system and to expect their delivery today. They know to call me back at [phone] with any questions or concerns.    [name], [place] [facility] Patient [place]  [phone] (phone) [phone] (fax) 12/24/2022 9:02 AM

## 2023-01-01 ENCOUNTER — Inpatient Hospital Stay: Payer: Medicare Other | Attending: Internal Medicine

## 2023-01-01 DIAGNOSIS — C9210 Chronic myeloid leukemia, BCR/ABL-positive, not having achieved remission: Secondary | ICD-10-CM | POA: Insufficient documentation

## 2023-01-01 DIAGNOSIS — D649 Anemia, unspecified: Secondary | ICD-10-CM | POA: Insufficient documentation

## 2023-01-01 DIAGNOSIS — R413 Other amnesia: Secondary | ICD-10-CM | POA: Insufficient documentation

## 2023-01-01 DIAGNOSIS — I4891 Unspecified atrial fibrillation: Secondary | ICD-10-CM | POA: Insufficient documentation

## 2023-01-01 DIAGNOSIS — Z79899 Other long term (current) drug therapy: Secondary | ICD-10-CM | POA: Insufficient documentation

## 2023-01-01 DIAGNOSIS — Z7901 Long term (current) use of anticoagulants: Secondary | ICD-10-CM | POA: Insufficient documentation

## 2023-01-01 DIAGNOSIS — Z8673 Personal history of transient ischemic attack (TIA), and cerebral infarction without residual deficits: Secondary | ICD-10-CM | POA: Insufficient documentation

## 2023-01-01 DIAGNOSIS — R39198 Other difficulties with micturition: Secondary | ICD-10-CM | POA: Insufficient documentation

## 2023-01-15 ENCOUNTER — Encounter: Payer: Self-pay | Admitting: Internal Medicine

## 2023-01-15 ENCOUNTER — Inpatient Hospital Stay: Payer: Medicare Other

## 2023-01-15 ENCOUNTER — Inpatient Hospital Stay (HOSPITAL_BASED_OUTPATIENT_CLINIC_OR_DEPARTMENT_OTHER): Payer: Medicare Other | Admitting: Internal Medicine

## 2023-01-15 VITALS — BP 119/56 | HR 70 | Temp 97.8°F | Resp 15 | Wt 153.0 lb

## 2023-01-15 DIAGNOSIS — R5383 Other fatigue: Secondary | ICD-10-CM

## 2023-01-15 DIAGNOSIS — Z79899 Other long term (current) drug therapy: Secondary | ICD-10-CM | POA: Diagnosis not present

## 2023-01-15 DIAGNOSIS — Z7901 Long term (current) use of anticoagulants: Secondary | ICD-10-CM | POA: Diagnosis not present

## 2023-01-15 DIAGNOSIS — Z5111 Encounter for antineoplastic chemotherapy: Secondary | ICD-10-CM

## 2023-01-15 DIAGNOSIS — C9210 Chronic myeloid leukemia, BCR/ABL-positive, not having achieved remission: Secondary | ICD-10-CM

## 2023-01-15 DIAGNOSIS — R413 Other amnesia: Secondary | ICD-10-CM | POA: Diagnosis not present

## 2023-01-15 DIAGNOSIS — Z8673 Personal history of transient ischemic attack (TIA), and cerebral infarction without residual deficits: Secondary | ICD-10-CM | POA: Diagnosis not present

## 2023-01-15 DIAGNOSIS — D649 Anemia, unspecified: Secondary | ICD-10-CM | POA: Diagnosis not present

## 2023-01-15 DIAGNOSIS — R39198 Other difficulties with micturition: Secondary | ICD-10-CM | POA: Diagnosis not present

## 2023-01-15 DIAGNOSIS — I4891 Unspecified atrial fibrillation: Secondary | ICD-10-CM | POA: Diagnosis not present

## 2023-01-15 LAB — COMPREHENSIVE METABOLIC PANEL
ALT: 7 U/L (ref 0–44)
AST: 28 U/L (ref 15–41)
Albumin: 4 g/dL (ref 3.5–5.0)
Alkaline Phosphatase: 73 U/L (ref 38–126)
Anion gap: 7 (ref 5–15)
BUN: 19 mg/dL (ref 8–23)
CO2: 25 mmol/L (ref 22–32)
Calcium: 9.1 mg/dL (ref 8.9–10.3)
Chloride: 107 mmol/L (ref 98–111)
Creatinine, Ser: 0.82 mg/dL (ref 0.61–1.24)
GFR, Estimated: >60 mL/min (ref >60)
Glucose, Bld: 122 mg/dL — ABNORMAL HIGH (ref 70–99)
Potassium: 4.2 mmol/L (ref 3.5–5.1)
Sodium: 139 mmol/L (ref 135–145)
Total Bilirubin: 0.8 mg/dL (ref 0.3–1.2)
Total Protein: 6.9 g/dL (ref 6.5–8.1)

## 2023-01-15 LAB — TSH: TSH: 1.697 u[IU]/mL (ref 0.350–4.500)

## 2023-01-15 NOTE — Progress Notes (Signed)
[place] NOTE  Patient Care Team: Baxter Hire, MD as PCP - General (Internal Medicine) Derek Jack, MD as Consulting Physician (Hematology)  CURRENT TREATMENT-  Dasatinib 70 mg started on 08/13/20.  Dose reduced to 20 mg on 10/02/2020 due to fluid retention  ASSESSMENT & PLAN:  Kevin Love 77 y.o. male with pmh of hypertension, hyperlipidemia, A-fib on Eliquis, CML, CHF, Parkinson's disease, stroke, was seen at [facility] hematology on 08/17/2022 as a transfer of care from Dr. Delton Coombes at [facility] due to close proximity to home for further management of CML.  #CML -Diagnosed 08/02/2020 when he presented with leukocytosis. -Marrow biopsy done on 08/13/2020 showed karyotype 71, XY t(9;22).  Hypercellular marrow with morphological features consistent with CML.  -Started on Dasatinib 70 mg on 08/13/2020.  Held on 09/09/2020 due to fluid retention.  Echo done on 09/10/2020 at [facility] showed normal LV size with EF 55%.  Mild concentric hypertrophy. mild to moderate MR.  -Dose reduced Dasatinib to 20 mg daily on 10/02/2020.  He has been tolerating it well.  Uses Lasix 20 mg as needed once every 2 weeks. -BCR/ABL PCR transcript  08/13/2021 0.0932 11/06/2021   0.0917 02/17/2022     0.1019 06/10/2022   0.2722 09/18/2022  <0.0032 01/15/2023   pending  -Patient has been getting Dasatinib by compassionate care.  He was out of Dasatinib from January 2024 until second week of February.  BCR-ABL PCR transcript pending from today.  CBC was reviewed and unremarkable.  CMP pending.  #Normocytic anemia -Completed IV Feraheme 550 mg x 2 on 08/10/2022. 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  Endoscopy and capsule study unremarkable. -Iron panel from Dec 2023 normal.  Hemoglobin stable at 13.  #CVA/A-fib: - Continue Eliquis twice daily.   # Memory problems: - Continue memantine 10 mg 2 tablets daily.   # Difficulty urination: -  Continue Flomax daily.   # ?Parkinson's disease: #Right arm tremor - Patient was seen end of January 2024 for fatigue, generalized weakness and tremors which was attributed to change in his medication by neurology.  Now he is off gabapentin and nortriptyline.  He is back on carbidopa and his symptoms have completely resolved.   Orders Placed This Encounter  Procedures   CBC with Differential/Platelet    Standing Status:   Future    Standing Expiration Date:   01/15/2024   Comprehensive metabolic panel    Standing Status:   Future    Standing Expiration Date:   01/15/2024   BCR-ABL1, CML/ALL, PCR, QUANT    Standing Status:   Future    Standing Expiration Date:   01/15/2024    RTC in 3 months for MD visit, labs 2 weeks prior.  The total time spent in the appointment was 30 minutes encounter with patients including review of chart and various tests results, discussions about plan of care and coordination of care plan   All questions were answered. The patient knows to call the clinic with any problems, questions or concerns. No barriers to learning was detected.  Jane Canary, MD 3/29/202411:31 AM   HISTORY OF PRESENTING ILLNESS:  Kevin Love 77 y.o. male with pmh of hypertension, hyperlipidemia, A-fib on Eliquis, CML, CHF, Parkinson's disease, stroke, was seen at [facility] hematology on 08/17/2022 as a transfer of care from Dr. Delton Coombes at [facility] due to close proximity to home for further management of CML.  INTERVAL HISTORY- Patient was seen today accompanied by his wife for  toxicity check for Dasatinib.  Patient was out of Dasatinib from January 2024 until second week of February.  He has been receiving it through compassionate care.  He is tolerating well.  He was seen for acute visit on 1/25 for fatigue, generalized weakness and tremors.  He had change in his medication by neurology.  He was taken off carbidopa levodopa and started on nortriptyline.  Right now he is off  nortriptyline and went back on carbidopa levodopa which has resolved all his symptoms and he feels well today.  He has been eating and drinking well.  Denies any shortness of breath, chest pain, nausea or vomiting.   I have reviewed his chart and materials related to his cancer extensively and collaborated history with the patient. 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 Chynoweth is evaluated at the request of Dr. Lonia Mad for hyperleukocytosis. -CBC on 07/23/2020 shows white count 148.3 with differential showing neutrophils, bands, promyelocytes, myelocytes and occasional blasts.  Platelet count was 138 and hemoglobin 12.9. -CBC on 03/05/2020 shows white count 15.2 with hemoglobin 15.4 and normal platelet count. -CBC in November 2020 showed white count 10.3 with normal hemoglobin and platelets.  -Dasatinib 70 mg daily started on 08/13/2020.  Held on 09/09/2020 due to fluid retention. -Bone marrow biopsy on 08/13/2020 with karyotype 24, XY,t(9;22), hypercellular bone marrow with morphological features consistent with CML. -2D echo on 09/10/2020 at [facility] showed normal LV size and function with EF 55%.  Mild concentric hypertrophy.  Normal right ventricle size with function.  Mild to moderate MR.  Trivial pericardial effusion.  No pericardial tamponade. -Dasatinib 20 mg daily started on 10/02/2020.   08/05/2020 Initial Diagnosis   CML (chronic myelocytic leukemia) [facility])     MEDICAL HISTORY:  Past Medical History:  Diagnosis Date   Anemia    Atrial fibrillation (HCC)    on Eliquis   CHF (congestive heart failure) (HCC)    Chronic Leukemia    Glaucoma    Hyperlipidemia    Hypertension    Parkinson disease 07/25/2020   Stroke [facility])     SURGICAL HISTORY: Past Surgical History:  Procedure Laterality Date   BACK SURGERY     COLONOSCOPY WITH  PROPOFOL N/A 06/03/2022   Procedure: COLONOSCOPY WITH PROPOFOL;  Surgeon: Lesly Rubenstein, MD;  Location: [facility] ENDOSCOPY;  Service: Endoscopy;  Laterality: N/A;   ESOPHAGOGASTRODUODENOSCOPY (EGD) WITH PROPOFOL N/A 06/03/2022   Procedure: ESOPHAGOGASTRODUODENOSCOPY (EGD) WITH PROPOFOL;  Surgeon: Lesly Rubenstein, MD;  Location: [facility] ENDOSCOPY;  Service: Endoscopy;  Laterality: N/A;   GIVENS CAPSULE STUDY N/A 06/04/2022   Procedure: GIVENS CAPSULE STUDY;  Surgeon: Lesly Rubenstein, MD;  Location: [facility]  ENDOSCOPY;  Service: Endoscopy;  Laterality: N/A;   toe removed Right     SOCIAL HISTORY: Social History   Socioeconomic History   Marital status: Married    Spouse name: Not on file   Number of children: Not on file   Years of education: Not on file   Highest education level: Not on file  Occupational History   Not on file  Tobacco Use   Smoking status: Former    Types: Cigarettes   Smokeless tobacco: Never  Substance and Sexual Activity   Alcohol use: Never   Drug use: Never   Sexual activity: Not Currently  Other Topics Concern   Not on file  Social History Narrative  Not on file   Social Determinants of Health   Financial Resource Strain: Low Risk  (08/22/2020)   Overall Financial Resource Strain (CARDIA)    Difficulty of Paying Living Expenses: Not hard at all  Food Insecurity: No Food Insecurity (08/22/2020)   Hunger Vital Sign    Worried About Running Out of Food in the Last Year: Never true    Ran Out of Food in the Last Year: Never true  Transportation Needs: No Transportation Needs (08/22/2020)   PRAPARE - Hydrologist (Medical): No    Lack of Transportation (Non-Medical): No  Physical Activity: Insufficiently Active (08/22/2020)   Exercise Vital Sign    Days of Exercise per Week: 7 days    Minutes of Exercise per Session: 10 min  Stress: No Stress Concern Present (08/22/2020)   [place]    Feeling of Stress : Not at all  Social Connections: Moderately Integrated (08/22/2020)   Social Connection and Isolation Panel [NHANES]    Frequency of Communication with Friends and Family: Three times a week    Frequency of Social Gatherings with Friends and Family: Once a week    Attends Religious Services: More than 4 times per year    Active Member of [employer] or Organizations: No    Attends Archivist Meetings: Never    Marital Status: Married  Human resources officer Violence: Not At Risk (08/22/2020)   Humiliation, Afraid, Rape, and Kick questionnaire    Fear of Current or Ex-Partner: No    Emotionally Abused: No    Physically Abused: No    Sexually Abused: No    FAMILY HISTORY: History reviewed. No pertinent family history.  ALLERGIES:  is allergic to penicillins.  MEDICATIONS:  Current Outpatient Medications  Medication Sig Dispense Refill   acetaminophen (TYLENOL) 500 MG tablet Take 1,000 mg by mouth every 6 (six) hours as needed for moderate pain.     apixaban (ELIQUIS) 5 MG TABS tablet Take 1 tablet (5 mg total) by mouth 2 (two) times daily. 60 tablet 0   atorvastatin (LIPITOR) 80 MG tablet Take 1 tablet (80 mg total) by mouth daily. 30 tablet 0   carbidopa-levodopa (PARCOPA) 25-100 MG disintegrating tablet Take 1 tablet by mouth 4 (four) times daily.     clonazePAM (KLONOPIN) 0.5 MG tablet Take 0.5 mg by mouth daily.     cyanocobalamin 1000 MCG tablet Take 1,000 mcg by mouth daily.     dasatinib (SPRYCEL) 20 MG tablet Take 1 tablet (20 mg total) by mouth daily. 30 tablet 11   furosemide (LASIX) 20 MG tablet Take 1 tablet (20 mg total) by mouth daily as needed (take daily in the morning as needed for ankle swelling). 30 tablet 1   ketorolac (ACULAR) 0.4 % SOLN Apply to eye.     latanoprost (XALATAN) 0.005 % ophthalmic solution Place 1 drop into the right eye at bedtime.     lisinopril (ZESTRIL) 20 MG tablet Take 20 mg by mouth daily.      memantine (NAMENDA) 10 MG tablet Take 10 mg by mouth 2 (two) times daily.     Multiple Vitamin (MULTIVITAMIN WITH MINERALS) TABS tablet Take 1 tablet by mouth daily.     tamsulosin (FLOMAX) 0.4 MG CAPS capsule Take 0.4 mg by mouth daily.     carbidopa-levodopa (SINEMET IR) 25-100 MG tablet Take 1 tablet by mouth daily for 6 days. 6 tablet 0   Multiple  Vitamins-Minerals (CENTRUM SILVER 50+MEN PO) Take 1 tablet by mouth daily. (Patient not taking: Reported on 01/15/2023)     nortriptyline (PAMELOR) 10 MG capsule Start Nortriptyline (Pamelor) 10 mg nightly for one week, then increase to 20 mg nightly and continue that dose. (Patient not taking: Reported on 01/15/2023)     predniSONE (DELTASONE) 5 MG tablet 6 DAY TAPER, TAKE AS DIRECTED WITH FOOD (Patient not taking: Reported on 08/17/2022)     No current facility-administered medications for this visit.    REVIEW OF SYSTEMS:   Pertinent information mentioned in HPI All other systems were reviewed with the patient and are negative.  PHYSICAL EXAMINATION: ECOG PERFORMANCE STATUS: 1 - Symptomatic but completely ambulatory  Vitals:   01/15/23 1041  BP: (!) 119/56  Pulse: 70  Resp: 15  Temp: 97.8 F (36.6 C)  SpO2: 97%    Filed Weights   01/15/23 1041  Weight: 153 lb (69.4 kg)     GENERAL:alert, no distress and comfortable SKIN: skin color, texture, turgor are normal, no rashes or significant lesions EYES: normal, conjunctiva are pink and non-injected, sclera clear OROPHARYNX:no exudate, no erythema and lips, buccal mucosa, and tongue normal  NECK: supple, thyroid normal size, non-tender, without nodularity LYMPH:  no palpable lymphadenopathy in the cervical, axillary or inguinal LUNGS: clear to auscultation and percussion with normal breathing effort HEART: regular rate & rhythm and no murmurs and no lower extremity edema ABDOMEN:abdomen soft, non-tender and normal bowel sounds Musculoskeletal:no cyanosis of digits and no  clubbing  PSYCH: alert & oriented x 3 with fluent speech NEURO: no focal motor/sensory deficits  LABORATORY DATA:  I have reviewed the data as listed Lab Results  Component Value Date   WBC 5.8 11/12/2022   HGB 13.2 11/12/2022   HCT 41.4 11/12/2022   MCV 96.3 11/12/2022   PLT 218 11/12/2022   Recent Labs    06/01/22 1856 06/02/22 0827 06/10/22 1413 11/12/22 1436 01/15/23 1022  NA  --    < > 141 139 139  K  --    < > 4.3 4.1 4.2  CL  --    < > 111 104 107  CO2  --    < > 25 27 25   GLUCOSE  --    < > 135* 118* 122*  BUN  --    < > 18 19 19   CREATININE  --    < > 1.26* 0.91 0.82  CALCIUM  --    < > 8.9 9.0 9.1  GFRNONAA  --    < > 59* >60 >60  PROT 6.3*   < > 6.6 7.0 6.9  ALBUMIN 3.4*   < > 3.5 4.1 4.0  AST 23   < > 22 27 28   ALT 30   < > 7 29 7   ALKPHOS 108   < > 123 76 73  BILITOT 0.5   < > 0.8 0.6 0.8  BILIDIR <0.1  --   --   --   --   IBILI NOT CALCULATED  --   --   --   --    < > = values in this interval not displayed.    RADIOGRAPHIC STUDIES: I have personally reviewed the radiological images as listed and agreed with the findings in the report. No results found.

## 2023-01-25 LAB — BCR-ABL1, CML/ALL, PCR, QUANT: Interpretation (BCRAL):: NEGATIVE

## 2023-04-09 ENCOUNTER — Inpatient Hospital Stay: Payer: Medicare Other | Attending: Internal Medicine

## 2023-04-09 DIAGNOSIS — C9210 Chronic myeloid leukemia, BCR/ABL-positive, not having achieved remission: Secondary | ICD-10-CM | POA: Diagnosis present

## 2023-04-09 LAB — COMPREHENSIVE METABOLIC PANEL
ALT: 32 U/L (ref 0–44)
AST: 23 U/L (ref 15–41)
Albumin: 3.9 g/dL (ref 3.5–5.0)
Alkaline Phosphatase: 61 U/L (ref 38–126)
Anion gap: 8 (ref 5–15)
BUN: 15 mg/dL (ref 8–23)
CO2: 24 mmol/L (ref 22–32)
Calcium: 9.1 mg/dL (ref 8.9–10.3)
Chloride: 108 mmol/L (ref 98–111)
Creatinine, Ser: 0.76 mg/dL (ref 0.61–1.24)
GFR, Estimated: >60 mL/min (ref >60)
Glucose, Bld: 132 mg/dL — ABNORMAL HIGH (ref 70–99)
Potassium: 3.8 mmol/L (ref 3.5–5.1)
Sodium: 140 mmol/L (ref 135–145)
Total Bilirubin: 0.7 mg/dL (ref 0.3–1.2)
Total Protein: 6.7 g/dL (ref 6.5–8.1)

## 2023-04-09 LAB — CBC WITH DIFFERENTIAL/PLATELET
Abs Immature Granulocytes: 0.01 10*3/uL (ref 0.00–0.07)
Basophils Absolute: 0.1 10*3/uL (ref 0.0–0.1)
Basophils Relative: 1 %
Eosinophils Absolute: 0.1 10*3/uL (ref 0.0–0.5)
Eosinophils Relative: 3 %
HCT: 36.1 % — ABNORMAL LOW (ref 39.0–52.0)
Hemoglobin: 12 g/dL — ABNORMAL LOW (ref 13.0–17.0)
Immature Granulocytes: 0 %
Lymphocytes Relative: 27 %
Lymphs Abs: 1.4 10*3/uL (ref 0.7–4.0)
MCH: 31.9 pg (ref 26.0–34.0)
MCHC: 33.2 g/dL (ref 30.0–36.0)
MCV: 96 fL (ref 80.0–100.0)
Monocytes Absolute: 0.4 10*3/uL (ref 0.1–1.0)
Monocytes Relative: 8 %
Neutro Abs: 3.4 10*3/uL (ref 1.7–7.7)
Neutrophils Relative %: 61 %
Platelets: 181 10*3/uL (ref 150–400)
RBC: 3.76 MIL/uL — ABNORMAL LOW (ref 4.22–5.81)
RDW: 13.6 % (ref 11.5–15.5)
WBC: 5.4 10*3/uL (ref 4.0–10.5)
nRBC: 0 % (ref 0.0–0.2)

## 2023-04-17 LAB — BCR-ABL1, CML/ALL, PCR, QUANT
E1A2 Transcript: <0.0032 %
Interpretation (BCRAL):: NEGATIVE
b2a2 transcript: <0.0032 %
b3a2 transcript: <0.0032 %

## 2023-04-23 ENCOUNTER — Inpatient Hospital Stay: Payer: Medicare Other | Attending: Internal Medicine | Admitting: Internal Medicine

## 2023-04-23 VITALS — BP 116/52 | HR 73 | Temp 97.8°F | Wt 156.3 lb

## 2023-04-23 DIAGNOSIS — R39198 Other difficulties with micturition: Secondary | ICD-10-CM | POA: Insufficient documentation

## 2023-04-23 DIAGNOSIS — I509 Heart failure, unspecified: Secondary | ICD-10-CM | POA: Insufficient documentation

## 2023-04-23 DIAGNOSIS — Z7901 Long term (current) use of anticoagulants: Secondary | ICD-10-CM | POA: Diagnosis not present

## 2023-04-23 DIAGNOSIS — Z79899 Other long term (current) drug therapy: Secondary | ICD-10-CM | POA: Diagnosis not present

## 2023-04-23 DIAGNOSIS — D649 Anemia, unspecified: Secondary | ICD-10-CM | POA: Diagnosis not present

## 2023-04-23 DIAGNOSIS — Z8673 Personal history of transient ischemic attack (TIA), and cerebral infarction without residual deficits: Secondary | ICD-10-CM | POA: Diagnosis not present

## 2023-04-23 DIAGNOSIS — R413 Other amnesia: Secondary | ICD-10-CM | POA: Insufficient documentation

## 2023-04-23 DIAGNOSIS — Z87891 Personal history of nicotine dependence: Secondary | ICD-10-CM | POA: Diagnosis not present

## 2023-04-23 DIAGNOSIS — C9210 Chronic myeloid leukemia, BCR/ABL-positive, not having achieved remission: Secondary | ICD-10-CM | POA: Insufficient documentation

## 2023-04-23 DIAGNOSIS — E785 Hyperlipidemia, unspecified: Secondary | ICD-10-CM | POA: Insufficient documentation

## 2023-04-23 DIAGNOSIS — I4891 Unspecified atrial fibrillation: Secondary | ICD-10-CM | POA: Insufficient documentation

## 2023-04-23 DIAGNOSIS — D500 Iron deficiency anemia secondary to blood loss (chronic): Secondary | ICD-10-CM | POA: Diagnosis not present

## 2023-04-23 DIAGNOSIS — G20A1 Parkinson's disease without dyskinesia, without mention of fluctuations: Secondary | ICD-10-CM | POA: Insufficient documentation

## 2023-04-23 NOTE — Progress Notes (Signed)
Constipation is ongoing for about a month. Patient says when he goes outside now that he is getting a little darker than normal so he wants to know if one of his medications could be causing this. Every now and then patient feels like he is unsteady.

## 2023-04-23 NOTE — Progress Notes (Signed)
[facility] CONSULT NOTE  Patient Care Team: Gracelyn Nurse, MD as PCP - General (Internal Medicine) Doreatha Massed, MD as Consulting Physician (Hematology)  CURRENT TREATMENT-  Dasatinib 70 mg started on 08/13/20.  Dose reduced to 20 mg on 10/02/2020 due to fluid retention  ASSESSMENT & PLAN:  Kevin Love 77 y.o. male with pmh of hypertension, hyperlipidemia, A-fib on Eliquis, CML, CHF, Parkinson's disease, stroke, was seen at [facility] hematology on 08/17/2022 as a transfer of care from Dr. Ellin Saba at [facility] due to close proximity to home for further management of CML.  #CML -Diagnosed 08/02/2020 when he presented with leukocytosis. -Marrow biopsy done on 08/13/2020 showed karyotype 46, XY t(9;22).  Hypercellular marrow with morphological features consistent with CML.  -Started on Dasatinib 70 mg on 08/13/2020.  Held on 09/09/2020 due to fluid retention.  Echo done on 09/10/2020 at [facility] showed normal LV size with EF 55%.  Mild concentric hypertrophy. mild to moderate MR.  -Dose reduced Dasatinib to 20 mg daily on 10/02/2020.  He has been tolerating it well.  Uses Lasix 20 mg as needed once every 2 weeks. -BCR/ABL PCR transcript  08/13/2021 0.0932 11/06/2021   0.0917 02/17/2022     0.1019 06/10/2022   0.2722 09/18/2022  <0.0032 01/15/2023   <0.0032 04/09/23      <0.0032  -Patient has been getting Dasatinib by compassionate care.  Continue with Dasatinib 20 mg daily.  He is tolerating well.  His BCR-ABL transcripts PCR continues to be negative.  Will follow-up in 3 months with repeat labs 2 weeks prior.  #Normocytic anemia -Completed IV Feraheme 550 mg x 2 on 08/10/2022. 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  Endoscopy and capsule study unremarkable. -Hb 12. Repeat iron panel next time.   #CVA/A-fib: - Continue Eliquis twice daily.   # Memory problems: - Continue memantine 10 mg 2 tablets daily.   # Difficulty  urination: - Continue Flomax daily.   # ?Parkinson's disease: #Right arm tremor - Patient was seen end of January 2024 for fatigue, generalized weakness and tremors which was attributed to change in his medication by neurology.  Now he is off gabapentin and nortriptyline.  He is back on carbidopa and his symptoms have completely resolved.   Orders Placed This Encounter  Procedures   CBC with Differential/Platelet    Standing Status:   Future    Standing Expiration Date:   04/22/2024   Comprehensive metabolic panel    Standing Status:   Future    Standing Expiration Date:   04/22/2024   BCR-ABL1, CML/ALL, PCR, QUANT    Standing Status:   Future    Standing Expiration Date:   04/22/2024   Iron and TIBC    Standing Status:   Future    Standing Expiration Date:   04/22/2024   Ferritin    Standing Status:   Future    Standing Expiration Date:   04/22/2024   RTC in 3 months for MD visit, labs 2 weeks prior.  The total time spent in the appointment was 30 minutes encounter with patients including review of chart and various tests results, discussions about plan of care and coordination of care plan   All questions were answered. The patient knows to call the clinic with any problems, questions or concerns. No barriers to learning was detected.  Michaelyn Barter, MD 7/5/202412:44 PM   HISTORY OF PRESENTING ILLNESS:  Kevin Love 77 y.o. male with pmh of hypertension, hyperlipidemia, A-fib  on Eliquis, CML, CHF, Parkinson's disease, stroke, was seen at [facility] hematology on 08/17/2022 as a transfer of care from Dr. Ellin Saba at [facility] due to close proximity to home for further management of CML.  INTERVAL HISTORY- Patient was seen today accompanied by his wife for toxicity check for Dasatinib.  He is taking Dasatinib 20 mg daily.  Tolerating well.  Reports constipation which has been an ongoing issue.  Advised to take Senokot on a daily basis and then add milk of mag and MiraLAX as needed.   Reports some unsteadiness with the gait.  We discussed that it is unlikely to be related to CML or mild anemia.  Should discuss with neurology if it worsens.  Has upcoming appointment in October with Dr. Hale Bogus.   I have reviewed his chart and materials related to his cancer extensively and collaborated history with the patient. 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 Stefanovich is evaluated at the request of Dr. [name] for hyperleukocytosis. -CBC on 07/23/2020 shows white count 148.3 with differential showing neutrophils, bands, promyelocytes, myelocytes and occasional blasts.  Platelet count was 138 and hemoglobin 12.9. -CBC on 03/05/2020 shows white count 15.2 with hemoglobin 15.4 and normal platelet count. -CBC in November 2020 showed white count 10.3 with normal hemoglobin and platelets.  -Dasatinib 70 mg daily started on 08/13/2020.  Held on 09/09/2020 due to fluid retention. -Bone marrow biopsy on 08/13/2020 with karyotype 46, XY,t(9;22), hypercellular bone marrow with morphological features consistent with CML. -2D echo on 09/10/2020 at [facility] showed normal LV size and function with EF 55%.  Mild concentric hypertrophy.  Normal right ventricle size with function.  Mild to moderate MR.  Trivial pericardial effusion.  No pericardial tamponade. -Dasatinib 20 mg daily started on 10/02/2020.   08/05/2020 Initial Diagnosis   CML (chronic myelocytic leukemia) [facility])     MEDICAL HISTORY:  Past Medical History:  Diagnosis Date   Anemia    Atrial fibrillation (HCC)    on Eliquis   CHF (congestive heart failure) (HCC)    Chronic Leukemia    Glaucoma    Hyperlipidemia    Hypertension    Parkinson disease 07/25/2020   Stroke [facility])     SURGICAL HISTORY: Past Surgical History:  Procedure Laterality Date   BACK SURGERY     COLONOSCOPY WITH PROPOFOL  N/A 06/03/2022   Procedure: COLONOSCOPY WITH PROPOFOL;  Surgeon: Regis Bill, MD;  Location: [facility] ENDOSCOPY;  Service: Endoscopy;  Laterality: N/A;   ESOPHAGOGASTRODUODENOSCOPY (EGD) WITH PROPOFOL N/A 06/03/2022   Procedure: ESOPHAGOGASTRODUODENOSCOPY (EGD) WITH PROPOFOL;  Surgeon: Regis Bill, MD;  Location: [facility] ENDOSCOPY;  Service: Endoscopy;  Laterality: N/A;   GIVENS CAPSULE STUDY N/A 06/04/2022   Procedure: GIVENS CAPSULE STUDY;  Surgeon: Regis Bill, MD;  Location: [facility] ENDOSCOPY;  Service: Endoscopy;  Laterality: N/A;   toe removed Right     SOCIAL HISTORY: Social History   Socioeconomic History   Marital status: Married    Spouse name: Not on file   Number of children: Not on file   Years of education: Not on file   Highest education level: Not on file  Occupational History   Not on file  Tobacco Use   Smoking status: Former    Types: Cigarettes   Smokeless tobacco: Never  Substance and Sexual Activity   Alcohol use: Never   Drug use: Never  Sexual activity: Not Currently  Other Topics Concern   Not on file  Social History Narrative   Not on file   Social Determinants of Health   Financial Resource Strain: Low Risk  (08/22/2020)   Overall Financial Resource Strain (CARDIA)    Difficulty of Paying Living Expenses: Not hard at all  Food Insecurity: No Food Insecurity (08/22/2020)   Hunger Vital Sign    Worried About Running Out of Food in the Last Year: Never true    Ran Out of Food in the Last Year: Never true  Transportation Needs: No Transportation Needs (08/22/2020)   PRAPARE - Administrator, Civil Service (Medical): No    Lack of Transportation (Non-Medical): No  Physical Activity: Insufficiently Active (08/22/2020)   Exercise Vital Sign    Days of Exercise per Week: 7 days    Minutes of Exercise per Session: 10 min  Stress: No Stress Concern Present (08/22/2020)   [employer] of Occupational Health - Occupational  Stress Questionnaire    Feeling of Stress : Not at all  Social Connections: Moderately Integrated (08/22/2020)   Social Connection and Isolation Panel [NHANES]    Frequency of Communication with Friends and Family: Three times a week    Frequency of Social Gatherings with Friends and Family: Once a week    Attends Religious Services: More than 4 times per year    Active Member of [employer] or Organizations: No    Attends Banker Meetings: Never    Marital Status: Married  Catering manager Violence: Not At Risk (08/22/2020)   Humiliation, Afraid, Rape, and Kick questionnaire    Fear of Current or Ex-Partner: No    Emotionally Abused: No    Physically Abused: No    Sexually Abused: No    FAMILY HISTORY: No family history on file.  ALLERGIES:  is allergic to penicillins.  MEDICATIONS:  Current Outpatient Medications  Medication Sig Dispense Refill   acetaminophen (TYLENOL) 500 MG tablet Take 1,000 mg by mouth every 6 (six) hours as needed for moderate pain.     apixaban (ELIQUIS) 5 MG TABS tablet Take 1 tablet (5 mg total) by mouth 2 (two) times daily. 60 tablet 0   atorvastatin (LIPITOR) 80 MG tablet Take 1 tablet (80 mg total) by mouth daily. 30 tablet 0   carbidopa-levodopa (PARCOPA) 25-100 MG disintegrating tablet Take 1 tablet by mouth 4 (four) times daily.     carbidopa-levodopa (SINEMET IR) 25-100 MG tablet Take 1 tablet by mouth daily for 6 days. 6 tablet 0   clonazePAM (KLONOPIN) 0.5 MG tablet Take 0.5 mg by mouth daily.     cyanocobalamin 1000 MCG tablet Take 1,000 mcg by mouth daily.     dasatinib (SPRYCEL) 20 MG tablet Take 1 tablet (20 mg total) by mouth daily. 30 tablet 11   furosemide (LASIX) 20 MG tablet Take 1 tablet (20 mg total) by mouth daily as needed (take daily in the morning as needed for ankle swelling). 30 tablet 1   ketorolac (ACULAR) 0.4 % SOLN Apply to eye.     latanoprost (XALATAN) 0.005 % ophthalmic solution Place 1 drop into the right eye at  bedtime.     lisinopril (ZESTRIL) 20 MG tablet Take 20 mg by mouth daily.     memantine (NAMENDA) 10 MG tablet Take 10 mg by mouth 2 (two) times daily.     Multiple Vitamin (MULTIVITAMIN WITH MINERALS) TABS tablet Take 1 tablet by mouth daily.  Multiple Vitamins-Minerals (CENTRUM SILVER 50+MEN PO) Take 1 tablet by mouth daily. (Patient not taking: Reported on 01/15/2023)     nortriptyline (PAMELOR) 10 MG capsule Start Nortriptyline (Pamelor) 10 mg nightly for one week, then increase to 20 mg nightly and continue that dose. (Patient not taking: Reported on 01/15/2023)     predniSONE (DELTASONE) 5 MG tablet 6 DAY TAPER, TAKE AS DIRECTED WITH FOOD (Patient not taking: Reported on 08/17/2022)     tamsulosin (FLOMAX) 0.4 MG CAPS capsule Take 0.4 mg by mouth daily.     No current facility-administered medications for this visit.    REVIEW OF SYSTEMS:   Pertinent information mentioned in HPI All other systems were reviewed with the patient and are negative.  PHYSICAL EXAMINATION: ECOG PERFORMANCE STATUS: 1 - Symptomatic but completely ambulatory  Vitals:   04/23/23 1055  BP: (!) 116/52  Pulse: 73  Temp: 97.8 F (36.6 C)  SpO2: 98%    Filed Weights   04/23/23 1055  Weight: 156 lb 4.8 oz (70.9 kg)     GENERAL:alert, no distress and comfortable SKIN: skin color, texture, turgor are normal, no rashes or significant lesions EYES: normal, conjunctiva are pink and non-injected, sclera clear OROPHARYNX:no exudate, no erythema and lips, buccal mucosa, and tongue normal  NECK: supple, thyroid normal size, non-tender, without nodularity LYMPH:  no palpable lymphadenopathy in the cervical, axillary or inguinal LUNGS: clear to auscultation and percussion with normal breathing effort HEART: regular rate & rhythm and no murmurs and no lower extremity edema ABDOMEN:abdomen soft, non-tender and normal bowel sounds Musculoskeletal:no cyanosis of digits and no clubbing  PSYCH: alert & oriented x 3  with fluent speech NEURO: no focal motor/sensory deficits  LABORATORY DATA:  I have reviewed the data as listed Lab Results  Component Value Date   WBC 5.4 04/09/2023   HGB 12.0 (L) 04/09/2023   HCT 36.1 (L) 04/09/2023   MCV 96.0 04/09/2023   PLT 181 04/09/2023   Recent Labs    06/01/22 1856 06/02/22 0827 11/12/22 1436 01/15/23 1022 04/09/23 1103  NA  --    < > 139 139 140  K  --    < > 4.1 4.2 3.8  CL  --    < > 104 107 108  CO2  --    < > 27 25 24   GLUCOSE  --    < > 118* 122* 132*  BUN  --    < > 19 19 15   CREATININE  --    < > 0.91 0.82 0.76  CALCIUM  --    < > 9.0 9.1 9.1  GFRNONAA  --    < > >60 >60 >60  PROT 6.3*   < > 7.0 6.9 6.7  ALBUMIN 3.4*   < > 4.1 4.0 3.9  AST 23   < > 27 28 23   ALT 30   < > 29 7 32  ALKPHOS 108   < > 76 73 61  BILITOT 0.5   < > 0.6 0.8 0.7  BILIDIR <0.1  --   --   --   --   IBILI NOT CALCULATED  --   --   --   --    < > = values in this interval not displayed.    RADIOGRAPHIC STUDIES: I have personally reviewed the radiological images as listed and agreed with the findings in the report. No results found.

## 2023-07-23 ENCOUNTER — Inpatient Hospital Stay: Payer: Medicare Other | Attending: Internal Medicine

## 2023-07-23 DIAGNOSIS — C9210 Chronic myeloid leukemia, BCR/ABL-positive, not having achieved remission: Secondary | ICD-10-CM | POA: Insufficient documentation

## 2023-08-06 ENCOUNTER — Inpatient Hospital Stay: Payer: Medicare Other | Admitting: Internal Medicine

## 2023-08-06 ENCOUNTER — Inpatient Hospital Stay: Payer: Medicare Other

## 2023-08-06 ENCOUNTER — Ambulatory Visit: Payer: Medicare Other | Admitting: Internal Medicine

## 2023-08-06 DIAGNOSIS — C9210 Chronic myeloid leukemia, BCR/ABL-positive, not having achieved remission: Secondary | ICD-10-CM | POA: Diagnosis present

## 2023-08-06 DIAGNOSIS — D500 Iron deficiency anemia secondary to blood loss (chronic): Secondary | ICD-10-CM

## 2023-08-06 LAB — IRON AND TIBC
Iron: 110 ug/dL (ref 45–182)
Saturation Ratios: 44 % — ABNORMAL HIGH (ref 17.9–39.5)
TIBC: 252 ug/dL (ref 250–450)
UIBC: 142 ug/dL

## 2023-08-06 LAB — COMPREHENSIVE METABOLIC PANEL
ALT: 15 U/L (ref 0–44)
AST: 23 U/L (ref 15–41)
Albumin: 4.1 g/dL (ref 3.5–5.0)
Alkaline Phosphatase: 68 U/L (ref 38–126)
Anion gap: 7 (ref 5–15)
BUN: 17 mg/dL (ref 8–23)
CO2: 25 mmol/L (ref 22–32)
Calcium: 9.2 mg/dL (ref 8.9–10.3)
Chloride: 108 mmol/L (ref 98–111)
Creatinine, Ser: 0.76 mg/dL (ref 0.61–1.24)
GFR, Estimated: >60 mL/min (ref >60)
Glucose, Bld: 101 mg/dL — ABNORMAL HIGH (ref 70–99)
Potassium: 4.5 mmol/L (ref 3.5–5.1)
Sodium: 140 mmol/L (ref 135–145)
Total Bilirubin: 0.6 mg/dL (ref 0.3–1.2)
Total Protein: 6.9 g/dL (ref 6.5–8.1)

## 2023-08-06 LAB — CBC WITH DIFFERENTIAL/PLATELET
Abs Immature Granulocytes: 0.01 10*3/uL (ref 0.00–0.07)
Basophils Absolute: 0 10*3/uL (ref 0.0–0.1)
Basophils Relative: 1 %
Eosinophils Absolute: 0.2 10*3/uL (ref 0.0–0.5)
Eosinophils Relative: 3 %
HCT: 38.4 % — ABNORMAL LOW (ref 39.0–52.0)
Hemoglobin: 13 g/dL (ref 13.0–17.0)
Immature Granulocytes: 0 %
Lymphocytes Relative: 23 %
Lymphs Abs: 1.5 10*3/uL (ref 0.7–4.0)
MCH: 32.5 pg (ref 26.0–34.0)
MCHC: 33.9 g/dL (ref 30.0–36.0)
MCV: 96 fL (ref 80.0–100.0)
Monocytes Absolute: 0.7 10*3/uL (ref 0.1–1.0)
Monocytes Relative: 10 %
Neutro Abs: 3.9 10*3/uL (ref 1.7–7.7)
Neutrophils Relative %: 63 %
Platelets: 192 10*3/uL (ref 150–400)
RBC: 4 MIL/uL — ABNORMAL LOW (ref 4.22–5.81)
RDW: 13.2 % (ref 11.5–15.5)
WBC: 6.3 10*3/uL (ref 4.0–10.5)
nRBC: 0 % (ref 0.0–0.2)

## 2023-08-06 LAB — FERRITIN: Ferritin: 41 ng/mL (ref 24–336)

## 2023-08-12 LAB — BCR-ABL1, CML/ALL, PCR, QUANT
E1A2 Transcript: <0.0032 %
Interpretation (BCRAL):: NEGATIVE
b2a2 transcript: <0.0032 %
b3a2 transcript: <0.0032 %

## 2023-08-20 ENCOUNTER — Inpatient Hospital Stay: Payer: Medicare Other | Attending: Internal Medicine | Admitting: Internal Medicine

## 2023-08-20 VITALS — BP 137/55 | HR 58 | Temp 98.0°F | Wt 158.0 lb

## 2023-08-20 DIAGNOSIS — Z7901 Long term (current) use of anticoagulants: Secondary | ICD-10-CM | POA: Diagnosis not present

## 2023-08-20 DIAGNOSIS — C9210 Chronic myeloid leukemia, BCR/ABL-positive, not having achieved remission: Secondary | ICD-10-CM | POA: Diagnosis not present

## 2023-08-20 DIAGNOSIS — Z87891 Personal history of nicotine dependence: Secondary | ICD-10-CM | POA: Insufficient documentation

## 2023-08-20 DIAGNOSIS — I4891 Unspecified atrial fibrillation: Secondary | ICD-10-CM | POA: Diagnosis not present

## 2023-08-20 DIAGNOSIS — R413 Other amnesia: Secondary | ICD-10-CM | POA: Insufficient documentation

## 2023-08-20 DIAGNOSIS — Z79899 Other long term (current) drug therapy: Secondary | ICD-10-CM | POA: Insufficient documentation

## 2023-08-20 DIAGNOSIS — Z8673 Personal history of transient ischemic attack (TIA), and cerebral infarction without residual deficits: Secondary | ICD-10-CM | POA: Diagnosis not present

## 2023-08-20 NOTE — Progress Notes (Signed)
[facility] CONSULT NOTE  Patient Care Team: Kevin Nurse, MD as PCP - General (Internal Medicine) Kevin Massed, MD as Consulting Physician (Hematology) Kevin Barter, MD as Consulting Physician (Oncology)  CURRENT TREATMENT-  Dasatinib 70 mg started on 08/13/20.  Dose reduced to 20 mg on 10/02/2020 due to fluid retention  ASSESSMENT & PLAN:  Kevin Love 77 y.o. male with pmh of hypertension, hyperlipidemia, A-fib on Eliquis, CML, CHF, Parkinson's disease, stroke, was seen at [facility] hematology on 08/17/2022 as a transfer of care from Dr. Ellin Love at [facility] due to close proximity to home for further management of CML.  #CML -Diagnosed 08/02/2020 when he presented with leukocytosis. -Marrow biopsy done on 08/13/2020 showed karyotype 46, XY t(9;22).  Hypercellular marrow with morphological features consistent with CML.  -Started on Dasatinib 70 mg on 08/13/2020.  Held on 09/09/2020 due to fluid retention.  Echo done on 09/10/2020 at [facility] showed normal LV size with EF 55%.  Mild concentric hypertrophy. mild to moderate MR.  -Dose reduced Dasatinib to 20 mg daily on 10/02/2020.  He has been tolerating it well.  Uses Lasix 20 mg as needed once every 2 weeks. -BCR/ABL PCR transcript  08/13/2021 0.0932 11/06/2021   0.0917 02/17/2022     0.1019 06/10/2022   0.2722 09/18/2022  <0.0032 01/15/2023   <0.0032 04/09/23      <0.0032 08/06/23    <0.0032  -Patient has been getting Dasatinib by compassionate care.  Continue with Dasatinib 20 mg daily.  He is tolerating well.  His BCR-ABL transcripts PCR continues to be undetectable.  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  He has multiple doctor appointment and requesting if we can push out visits to every 4 months.  Okay with that.  #Normocytic anemia -Completed IV Feraheme 550 mg x 2 on 08/10/2022. 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  Endoscopy and capsule study unremarkable. -Hemoglobin  13.  Iron studies are normal.  #CVA/A-fib: - Continue Eliquis twice daily.   # Memory problems: - Continue memantine 10 mg 2 tablets daily.   # Difficulty urination: - Continue Flomax daily.   # ?Parkinson's disease: #Right arm tremor - Patient was seen end of January 2024 for fatigue, generalized weakness and tremors which was attributed to change in his medication by neurology.  Now he is off gabapentin and nortriptyline.  He is back on carbidopa and his symptoms have completely resolved. -Continue follow up with Dr. Hale Love, neurology   Orders Placed This Encounter  Procedures   CBC with Differential (Cancer Center Only)    Standing Status:   Future    Standing Expiration Date:   08/19/2024   CMP (Cancer Center only)    Standing Status:   Future    Standing Expiration Date:   08/19/2024   BCR-ABL1, CML/ALL, PCR, QUANT    Standing Status:   Future    Standing Expiration Date:   08/19/2024   RTC in 4 months for MD visit, labs 2 weeks prior.  The total time spent in the appointment was 30 minutes encounter with patients including review of chart and various tests results, discussions about plan of care and coordination of care plan   All questions were answered. The patient knows to call the clinic with any problems, questions or concerns. No barriers to learning was detected.  Kevin Barter, MD 11/1/20241:25 PM   HISTORY OF PRESENTING ILLNESS:  Kevin Love 77 y.o. male with pmh of hypertension, hyperlipidemia, A-fib  on Eliquis, CML, CHF, Parkinson's disease, stroke, was seen at [facility] hematology on 08/17/2022 as a transfer of care from Dr. Ellin Love at [facility] due to close proximity to home for further management of CML.  INTERVAL HISTORY- Patient was seen today accompanied by his wife for toxicity check for Dasatinib.  He is taking Dasatinib 20 mg daily.  Tolerating well.  Constipation has resolved with Senokot on daily basis.   I have reviewed his chart and  materials related to his cancer extensively and collaborated history with the patient. 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 -CBC on 07/23/2020 shows white count 148.3 with differential showing neutrophils, bands, promyelocytes, myelocytes and occasional blasts.  Platelet count was 138 and hemoglobin 12.9. -CBC on 03/05/2020 shows white count 15.2 with hemoglobin 15.4 and normal platelet count. -CBC in November 2020 showed white count 10.3 with normal hemoglobin and platelets.  -Dasatinib 70 mg daily started on 08/13/2020.  Held on 09/09/2020 due to fluid retention. -Bone marrow biopsy on 08/13/2020 with karyotype 46, XY,t(9;22), hypercellular bone marrow with morphological features consistent with CML. -2D echo on 09/10/2020 at [facility] showed normal LV size and function with EF 55%.  Mild concentric hypertrophy.  Normal right ventricle size with function.  Mild to moderate MR.  Trivial pericardial effusion.  No pericardial tamponade. -Dasatinib 20 mg daily started on 10/02/2020.   08/05/2020 Initial Diagnosis   CML (chronic myelocytic leukemia) [facility])     MEDICAL HISTORY:  Past Medical History:  Diagnosis Date   Anemia    Atrial fibrillation (HCC)    on Eliquis   CHF (congestive heart failure) (HCC)    Chronic Leukemia    Glaucoma    Hyperlipidemia    Hypertension    Parkinson disease 07/25/2020   Stroke [facility])     SURGICAL HISTORY: Past Surgical History:  Procedure Laterality Date   BACK SURGERY     COLONOSCOPY WITH PROPOFOL N/A 06/03/2022   Procedure: COLONOSCOPY WITH PROPOFOL;  Surgeon: Kevin Bill, MD;  Location: [facility] ENDOSCOPY;  Service: Endoscopy;  Laterality: N/A;   ESOPHAGOGASTRODUODENOSCOPY (EGD) WITH PROPOFOL N/A 06/03/2022   Procedure:  ESOPHAGOGASTRODUODENOSCOPY (EGD) WITH PROPOFOL;  Surgeon: Kevin Bill, MD;  Location: [facility] ENDOSCOPY;  Service: Endoscopy;  Laterality: N/A;   GIVENS CAPSULE STUDY N/A 06/04/2022   Procedure: GIVENS CAPSULE STUDY;  Surgeon: Kevin Bill, MD;  Location: [facility] ENDOSCOPY;  Service: Endoscopy;  Laterality: N/A;   toe removed Right     SOCIAL HISTORY: Social History   Socioeconomic History   Marital status: Married    Spouse name: Not on file   Number of children: Not on file   Years of education: Not on file   Highest education level: Not on file  Occupational History   Not on file  Tobacco Use   Smoking status: Former    Types: Cigarettes   Smokeless tobacco: Never  Substance and Sexual Activity   Alcohol use: Never   Drug use: Never   Sexual activity: Not Currently  Other Topics Concern   Not on file  Social History Narrative   Not on file   Social Determinants of Health   Financial Resource Strain: Low Risk  (08/04/2023)   Received from [facility]   Overall Financial Resource Strain (CARDIA)    Difficulty of  Paying Living Expenses: Not hard at all  Food Insecurity: No Food Insecurity (08/04/2023)   Received from [facility]   Hunger Vital Sign    Worried About Running Out of Food in the Last Year: Never true    Ran Out of Food in the Last Year: Never true  Transportation Needs: No Transportation Needs (08/04/2023)   Received from [facility] - Transportation    In the past 12 months, has lack of transportation kept you from medical appointments or from getting medications?: No    Lack of Transportation (Non-Medical): No  Physical Activity: Insufficiently Active (08/22/2020)   Exercise Vital Sign    Days of Exercise per Week: 7 days    Minutes of Exercise per Session: 10 min  Stress: No Stress Concern Present (08/22/2020)   [employer] of Occupational Health - Occupational Stress  Questionnaire    Feeling of Stress : Not at all  Social Connections: Moderately Integrated (08/22/2020)   Social Connection and Isolation Panel [NHANES]    Frequency of Communication with Friends and Family: Three times a week    Frequency of Social Gatherings with Friends and Family: Once a week    Attends Religious Services: More than 4 times per year    Active Member of [employer] or Organizations: No    Attends Banker Meetings: Never    Marital Status: Married  Catering manager Violence: Not At Risk (08/22/2020)   Humiliation, Afraid, Rape, and Kick questionnaire    Fear of Current or Ex-Partner: No    Emotionally Abused: No    Physically Abused: No    Sexually Abused: No    FAMILY HISTORY: No family history on file.  ALLERGIES:  is allergic to penicillins.  MEDICATIONS:  Current Outpatient Medications  Medication Sig Dispense Refill   acetaminophen (TYLENOL) 500 MG tablet Take 1,000 mg by mouth every 6 (six) hours as needed for moderate pain.     apixaban (ELIQUIS) 5 MG TABS tablet Take 1 tablet (5 mg total) by mouth 2 (two) times daily. 60 tablet 0   atorvastatin (LIPITOR) 80 MG tablet Take 1 tablet (80 mg total) by mouth daily. 30 tablet 0   carbidopa-levodopa (SINEMET IR) 25-100 MG tablet Take 1 tablet by mouth daily for 6 days. 6 tablet 0   clonazePAM (KLONOPIN) 0.5 MG tablet Take 0.5 mg by mouth daily.     cyanocobalamin 1000 MCG tablet Take 1,000 mcg by mouth daily.     dasatinib (SPRYCEL) 20 MG tablet Take 1 tablet (20 mg total) by mouth daily. 30 tablet 11   furosemide (LASIX) 20 MG tablet Take 1 tablet (20 mg total) by mouth daily as needed (take daily in the morning as needed for ankle swelling). 30 tablet 1   ketorolac (ACULAR) 0.4 % SOLN Apply to eye.     latanoprost (XALATAN) 0.005 % ophthalmic solution Place 1 drop into the right eye at bedtime.     lisinopril (ZESTRIL) 20 MG tablet Take 20 mg by mouth daily.     memantine (NAMENDA) 10 MG tablet Take 10  mg by mouth 2 (two) times daily.     Multiple Vitamin (MULTIVITAMIN WITH MINERALS) TABS tablet Take 1 tablet by mouth daily.     tamsulosin (FLOMAX) 0.4 MG CAPS capsule Take 0.4 mg by mouth daily.     carbidopa-levodopa (PARCOPA) 25-100 MG disintegrating tablet Take 1 tablet by mouth 4 (four) times daily. (Patient not taking: Reported on 08/20/2023)  Multiple Vitamins-Minerals (CENTRUM SILVER 50+MEN PO) Take 1 tablet by mouth daily. (Patient not taking: Reported on 01/15/2023)     predniSONE (DELTASONE) 5 MG tablet 6 DAY TAPER, TAKE AS DIRECTED WITH FOOD (Patient not taking: Reported on 08/17/2022)     No current facility-administered medications for this visit.    REVIEW OF SYSTEMS:   Pertinent information mentioned in HPI All other systems were reviewed with the patient and are negative.  PHYSICAL EXAMINATION: ECOG PERFORMANCE STATUS: 1 - Symptomatic but completely ambulatory  Vitals:   08/20/23 1301  BP: (!) 137/55  Pulse: (!) 58  Temp: 98 F (36.7 C)  SpO2: 97%     Filed Weights   08/20/23 1301  Weight: 158 lb (71.7 kg)      GENERAL:alert, no distress and comfortable SKIN: skin color, texture, turgor are normal, no rashes or significant lesions EYES: normal, conjunctiva are pink and non-injected, sclera clear OROPHARYNX:no exudate, no erythema and lips, buccal mucosa, and tongue normal  NECK: supple, thyroid normal size, non-tender, without nodularity LYMPH:  no palpable lymphadenopathy in the cervical, axillary or inguinal LUNGS: clear to auscultation and percussion with normal breathing effort HEART: regular rate & rhythm and no murmurs and no lower extremity edema ABDOMEN:abdomen soft, non-tender and normal bowel sounds Musculoskeletal:no cyanosis of digits and no clubbing  PSYCH: alert & oriented x 3 with fluent speech NEURO: no focal motor/sensory deficits  LABORATORY DATA:  I have reviewed the data as listed Lab Results  Component Value Date   WBC 6.3  08/06/2023   HGB 13.0 08/06/2023   HCT 38.4 (L) 08/06/2023   MCV 96.0 08/06/2023   PLT 192 08/06/2023   Recent Labs    01/15/23 1022 04/09/23 1103 08/06/23 1252  NA 139 140 140  K 4.2 3.8 4.5  CL 107 108 108  CO2 25 24 25   GLUCOSE 122* 132* 101*  BUN 19 15 17   CREATININE 0.82 0.76 0.76  CALCIUM 9.1 9.1 9.2  GFRNONAA >60 >60 >60  PROT 6.9 6.7 6.9  ALBUMIN 4.0 3.9 4.1  AST 28 23 23   ALT 7 32 15  ALKPHOS 73 61 68  BILITOT 0.8 0.7 0.6    RADIOGRAPHIC STUDIES: I have personally reviewed the radiological images as listed and agreed with the findings in the report. No results found.

## 2023-08-20 NOTE — Progress Notes (Signed)
Patient is wanting to see about getting help from [facility] for his treatment cost, they say it is an application that was started but it has been a while ago.

## 2023-10-07 ENCOUNTER — Telehealth: Payer: Self-pay

## 2023-10-07 NOTE — Telephone Encounter (Signed)
Faxed over patients financial assistance to [facility] that has Dr. Alan Ripper medical care statement.

## 2023-10-20 ENCOUNTER — Other Ambulatory Visit: Payer: Self-pay

## 2023-10-20 ENCOUNTER — Emergency Department
Admission: EM | Admit: 2023-10-20 | Discharge: 2023-10-20 | Disposition: A | Discharge status: Home / Self Care | Payer: Medicare Other | Attending: Emergency Medicine | Admitting: Emergency Medicine

## 2023-10-20 ENCOUNTER — Emergency Department: Payer: Medicare Other

## 2023-10-20 ENCOUNTER — Encounter: Payer: Self-pay | Admitting: Emergency Medicine

## 2023-10-20 DIAGNOSIS — I509 Heart failure, unspecified: Secondary | ICD-10-CM | POA: Diagnosis not present

## 2023-10-20 DIAGNOSIS — M25551 Pain in right hip: Secondary | ICD-10-CM | POA: Insufficient documentation

## 2023-10-20 DIAGNOSIS — I110 Hypertensive heart disease with heart failure: Secondary | ICD-10-CM | POA: Insufficient documentation

## 2023-10-20 DIAGNOSIS — G20A1 Parkinson's disease without dyskinesia, without mention of fluctuations: Secondary | ICD-10-CM | POA: Diagnosis not present

## 2023-10-20 MED ORDER — HYDROCODONE-ACETAMINOPHEN 5-325 MG PO TABS: 1.0000 | ORAL_TABLET | Freq: Four times a day (QID) | ORAL | 0 refills | Status: DC | PRN

## 2023-10-20 MED ORDER — HYDROCODONE-ACETAMINOPHEN 5-325 MG PO TABS
1.0000 | ORAL_TABLET | Freq: Once | ORAL | Status: AC
  Administered 2023-10-20: 1 via ORAL
  Filled 2023-10-20: qty 1

## 2023-10-20 NOTE — ED Triage Notes (Signed)
 Pt in via POV, reports waking up yesterday morning w/ right hip pain.  Denies any recent fall or injury.  Does report hx of lower back and hip pain, follows physiatry, but states this is a new pain.  NAD noted at this time.

## 2023-10-20 NOTE — Discharge Instructions (Signed)
 Please schedule follow-up appointment with your physiatrist.  You can take 650 mg of Tylenol  every 6 hours for mild to moderate pain, (pain score 1-6).  Take the Norco every 6 hours as needed for severe pain (pain score 7-10).  Keep in mind the Norco can be sedating, so do not drive after taking it.

## 2023-10-20 NOTE — ED Provider Notes (Signed)
 [facility] Provider Note    Event Date/Time   First MD Initiated Contact with Patient 10/20/23 1444     (approximate)   History   Hip Pain   HPI  Kevin Love is a 78 y.o. male with PMH of parkinson disease, glaucoma, afib, CHF, anemia, stroke and HTN presents for evaluation of hip pain. Patient seen by physiatry and recently had radiofrequency ablation done earlier this month to treat back and hip pain.  He states that this pain feels different.  He reports tossing and turning a lot at night which he believes may have caused his pain.  He took a Tylenol  3 at home which seemed to help.  He denies numbness, tingling and weakness in the right leg.      Physical Exam   Triage Vital Signs: ED Triage Vitals  Encounter Vitals Group     BP 10/20/23 1420 (!) 135/54     Systolic BP Percentile --      Diastolic BP Percentile --      Pulse Rate 10/20/23 1420 63     Resp 10/20/23 1420 17     Temp 10/20/23 1420 98.5 F (36.9 C)     Temp Source 10/20/23 1420 Oral     SpO2 10/20/23 1420 94 %     Weight 10/20/23 1421 150 lb (68 kg)     Height 10/20/23 1421 5' 3 (1.6 m)     Head Circumference --      Peak Flow --      Pain Score 10/20/23 1421 5     Pain Loc --      Pain Education --      Exclude from Growth Chart --     Most recent vital signs: Vitals:   10/20/23 1420  BP: (!) 135/54  Pulse: 63  Resp: 17  Temp: 98.5 F (36.9 C)  SpO2: 94%   General: Awake, no distress.  CV:  Good peripheral perfusion.  Resp:  Normal effort.  Abd:  No distention.  Other:  Tender to palpation over the greater trochanter.  Right hip ROM maintained, pain elicited with internal and external rotation of the hip as well as hip abduction and adduction.  5/5 hip strength. Dorsalis pedis pulses 2+ and regular, capillary refill is appropriate.  Sensation intact across all dermatomes.   ED Results / Procedures / Treatments   Labs (all labs ordered are listed, but only  abnormal results are displayed) Labs Reviewed - No data to display   RADIOLOGY  Right hip x-ray obtained, interpreted the images as well as reviewed the radiologist report which was negative for any acute abnormalities but did show some mild degenerative disease.   PROCEDURES:  Critical Care performed: No  Procedures   MEDICATIONS ORDERED IN ED: Medications  HYDROcodone -acetaminophen  (NORCO/VICODIN) 5-325 MG per tablet 1 tablet (has no administration in time range)     IMPRESSION / MDM / ASSESSMENT AND PLAN / ED COURSE  I reviewed the triage vital signs and the nursing notes.                             78 year old male presents for evaluation of right hip pain.  Patient was hypertensive in triage but does have a history of hypertension.  Vital signs stable otherwise.  Patient NAD on exam.  Differential diagnosis includes, but is not limited to, muscle strain, dislocation, fracture, lumbar radiculopathy, sciatica.  Patient's presentation is  most consistent with acute complicated illness / injury requiring diagnostic workup.  Right hip x-ray was negative.  Physical exam is reassuring.  I recommended that patient follow-up with his physiatrist.  In the meantime I will give him a few days of pain medication.  He was advised to continue using the heating pad as well as other topical pain relievers.  He voiced understanding, all questions were answered and he was stable at discharge.   FINAL CLINICAL IMPRESSION(S) / ED DIAGNOSES   Final diagnoses:  Right hip pain     Rx / DC Orders   ED Discharge Orders          Ordered    HYDROcodone -acetaminophen  (NORCO/VICODIN) 5-325 MG tablet  Every 6 hours PRN        10/20/23 1557             Note:  This document was prepared using Dragon voice recognition software and may include unintentional dictation errors.   Cleaster Tinnie LABOR, PA-C 10/20/23 1559    Ernest Ronal BRAVO, MD 10/20/23 1900

## 2023-10-26 ENCOUNTER — Other Ambulatory Visit (HOSPITAL_COMMUNITY): Payer: Self-pay

## 2023-10-26 ENCOUNTER — Other Ambulatory Visit: Payer: Self-pay

## 2023-10-26 ENCOUNTER — Telehealth: Payer: Self-pay

## 2023-10-26 ENCOUNTER — Encounter: Payer: Self-pay | Admitting: Hematology

## 2023-10-26 ENCOUNTER — Other Ambulatory Visit: Payer: Self-pay | Admitting: Pharmacist

## 2023-10-26 DIAGNOSIS — C9210 Chronic myeloid leukemia, BCR/ABL-positive, not having achieved remission: Secondary | ICD-10-CM

## 2023-10-26 MED ORDER — DASATINIB 20 MG PO TABS
20.0000 mg | ORAL_TABLET | Freq: Every day | ORAL | 1 refills | Status: DC
  Filled 2023-10-26: qty 30, 30d supply, fill #0
  Filled 2023-11-12: qty 30, 30d supply, fill #1

## 2023-10-26 NOTE — Telephone Encounter (Signed)
 Patient successfully OnBoarded. Medication scheduled to be shipped on Tuesday, 10/26/23, for delivery on Wendesday, 10/27/23, from [facility] to patient's address. Patient also knows to call me at [phone] with any questions or concerns regarding receiving medication or if there is any unexpected change in co-pay.    Morene Potters, CPhT Oncology Pharmacy Patient Advocate  [facility]  [phone] (phone) [phone] (fax) 10/26/2023 10:50 AM

## 2023-10-26 NOTE — Telephone Encounter (Signed)
 Oral Oncology Patient Advocate Encounter  Re-authorization   Received notification that prior authorization for Dasatinib  is due for renewal.   PA submitted on 10/26/23  Key [id number]  Status is pending     Morene Potters, CPhT Oncology Pharmacy Patient Advocate  [facility]  [phone] (phone) [phone] (fax) 10/26/2023 8:45 AM

## 2023-10-26 NOTE — Telephone Encounter (Signed)
 Oral Oncology Patient Advocate Encounter  **PAP to [facility] in Jan 2025**   Was successful in securing patient a $5,000.00 grant from [employer] to provide copayment coverage for Dasatinib .  This will keep the out of pocket expense at $0.    The billing information is as follows and has been shared with [facility].   RxBin: [id number] PCN:  [id number] Member ID: [id number] Group ID: [id number] Dates of Eligibility: 09/19/23 through    Morene Potters, CPhT Oncology Pharmacy Patient Advocate  [facility]  [phone] (phone) [phone] (fax) 10/26/2023 8:57 AM

## 2023-10-26 NOTE — Progress Notes (Signed)
 Patient switching from PAP to filling at [facility] (Specialty)

## 2023-10-26 NOTE — Progress Notes (Signed)
 Specialty Pharmacy Initial Fill Coordination Note  Kevin Love is a 78 y.o. male contacted today regarding initial fill of specialty medication(s) Dasatinib  (SPRYCEL )  Patient requested Delivery   Delivery date: 10/27/23   Verified address: [name], [place], [place] [address]  Medication will be filled on 10/26/23.   Patient is aware of $0.00 copayment. [facility] Secondary.    Kevin Love, CPhT Oncology Pharmacy Patient Advocate  [facility]  [phone] (phone) [phone] (fax) 10/26/2023 10:48 AM

## 2023-10-26 NOTE — Telephone Encounter (Signed)
 Oral Oncology Patient Advocate Encounter  Prior Authorization for Dasatinib  has been approved.    PA# [id number]  Effective dates: 10/20/23 through 10/18/24  Patients co-pay is $1,112.22.   Obtained grant through [facility] to make co-pay $0.00.   Morene Potters, CPhT Oncology Pharmacy Patient Advocate  [facility]  [phone] (phone) [phone] (fax) 10/26/2023 8:53 AM

## 2023-10-26 NOTE — Progress Notes (Signed)
 Specialty Pharmacy Initiation Note   Kevin Love is a 78 y.o. male who will be followed by the specialty pharmacy service for RxSp Oncology    Review of administration, indication, effectiveness, safety, potential side effects, storage/disposable, and missed dose instructions occurred today for patient's specialty medication(s) Dasatinib  (SPRYCEL )     Patient/Caregiver did not have any additional questions or concerns.   Patient's therapy is appropriate to: Continue    Goals Addressed             This Visit's Progress    Achieve or maintain remission       Patient is on track. Patient will maintain adherence        Patient switching from PAP to filling at [facility] (Specialty)  [name] Specialty Pharmacist

## 2023-10-27 ENCOUNTER — Other Ambulatory Visit: Payer: Self-pay

## 2023-11-11 ENCOUNTER — Other Ambulatory Visit: Payer: Self-pay

## 2023-11-12 ENCOUNTER — Other Ambulatory Visit: Payer: Self-pay

## 2023-11-12 ENCOUNTER — Other Ambulatory Visit (HOSPITAL_COMMUNITY): Payer: Self-pay

## 2023-11-12 NOTE — Progress Notes (Signed)
Specialty Pharmacy Refill Coordination Note  Kevin Love is a 78 y.o. male who's wife, Kevin Love was contacted today regarding refills of specialty medication(s) [name]   Patient requested Delivery   Delivery date: 11/23/23   Verified address: [address]   Medication will be filled on 11/22/23.

## 2023-11-12 NOTE — Progress Notes (Signed)
Specialty Pharmacy Ongoing Clinical Assessment Note  Kevin Love is a 78 y.o. male who is being followed by the specialty pharmacy service for RxSp Oncology   Patient's specialty medication(s) reviewed today: Dasatinib (SPRYCEL)   Missed doses in the last 4 weeks: 0   Patient/Caregiver did not have any additional questions or concerns.   Therapeutic benefit summary: Unable to assess   Adverse events/side effects summary: No adverse events/side effects   Patient's therapy is appropriate to: Continue    Goals Addressed             This Visit's Progress    Stabilization of disease       Patient is on track. Patient will maintain adherence         Follow up:  3 months  Bobette Mo Specialty Pharmacist

## 2023-12-03 ENCOUNTER — Inpatient Hospital Stay: Payer: Medicare Other | Attending: Internal Medicine

## 2023-12-03 DIAGNOSIS — Z7901 Long term (current) use of anticoagulants: Secondary | ICD-10-CM | POA: Diagnosis not present

## 2023-12-03 DIAGNOSIS — G20A1 Parkinson's disease without dyskinesia, without mention of fluctuations: Secondary | ICD-10-CM | POA: Diagnosis not present

## 2023-12-03 DIAGNOSIS — F32A Depression, unspecified: Secondary | ICD-10-CM | POA: Insufficient documentation

## 2023-12-03 DIAGNOSIS — D649 Anemia, unspecified: Secondary | ICD-10-CM | POA: Insufficient documentation

## 2023-12-03 DIAGNOSIS — C9210 Chronic myeloid leukemia, BCR/ABL-positive, not having achieved remission: Secondary | ICD-10-CM | POA: Diagnosis present

## 2023-12-03 DIAGNOSIS — Z8673 Personal history of transient ischemic attack (TIA), and cerebral infarction without residual deficits: Secondary | ICD-10-CM | POA: Insufficient documentation

## 2023-12-03 DIAGNOSIS — I4891 Unspecified atrial fibrillation: Secondary | ICD-10-CM | POA: Insufficient documentation

## 2023-12-03 DIAGNOSIS — R39198 Other difficulties with micturition: Secondary | ICD-10-CM | POA: Diagnosis not present

## 2023-12-03 DIAGNOSIS — Z87891 Personal history of nicotine dependence: Secondary | ICD-10-CM | POA: Diagnosis not present

## 2023-12-03 LAB — CBC WITH DIFFERENTIAL (CANCER CENTER ONLY)
Abs Immature Granulocytes: 0.03 10*3/uL (ref 0.00–0.07)
Basophils Absolute: 0.1 10*3/uL (ref 0.0–0.1)
Basophils Relative: 1 %
Eosinophils Absolute: 0.2 10*3/uL (ref 0.0–0.5)
Eosinophils Relative: 2 %
HCT: 38.1 % — ABNORMAL LOW (ref 39.0–52.0)
Hemoglobin: 12.9 g/dL — ABNORMAL LOW (ref 13.0–17.0)
Immature Granulocytes: 0 %
Lymphocytes Relative: 26 %
Lymphs Abs: 1.9 10*3/uL (ref 0.7–4.0)
MCH: 32.7 pg (ref 26.0–34.0)
MCHC: 33.9 g/dL (ref 30.0–36.0)
MCV: 96.5 fL (ref 80.0–100.0)
Monocytes Absolute: 0.6 10*3/uL (ref 0.1–1.0)
Monocytes Relative: 9 %
Neutro Abs: 4.6 10*3/uL (ref 1.7–7.7)
Neutrophils Relative %: 62 %
Platelet Count: 176 10*3/uL (ref 150–400)
RBC: 3.95 MIL/uL — ABNORMAL LOW (ref 4.22–5.81)
RDW: 13.2 % (ref 11.5–15.5)
WBC Count: 7.4 10*3/uL (ref 4.0–10.5)
nRBC: 0 % (ref 0.0–0.2)

## 2023-12-03 LAB — CMP (CANCER CENTER ONLY)
ALT: 9 U/L (ref 0–44)
AST: 20 U/L (ref 15–41)
Albumin: 3.8 g/dL (ref 3.5–5.0)
Alkaline Phosphatase: 72 U/L (ref 38–126)
Anion gap: 9 (ref 5–15)
BUN: 17 mg/dL (ref 8–23)
CO2: 24 mmol/L (ref 22–32)
Calcium: 9 mg/dL (ref 8.9–10.3)
Chloride: 104 mmol/L (ref 98–111)
Creatinine: 0.91 mg/dL (ref 0.61–1.24)
GFR, Estimated: >60 mL/min (ref >60)
Glucose, Bld: 148 mg/dL — ABNORMAL HIGH (ref 70–99)
Potassium: 4 mmol/L (ref 3.5–5.1)
Sodium: 137 mmol/L (ref 135–145)
Total Bilirubin: 0.7 mg/dL (ref 0.0–1.2)
Total Protein: 6.8 g/dL (ref 6.5–8.1)

## 2023-12-10 ENCOUNTER — Other Ambulatory Visit: Payer: Self-pay

## 2023-12-10 LAB — BCR-ABL1, CML/ALL, PCR, QUANT
E1A2 Transcript: <0.0032 %
b2a2 transcript: <0.0032 %
b3a2 transcript: 0.0797 %

## 2023-12-13 ENCOUNTER — Other Ambulatory Visit: Payer: Self-pay

## 2023-12-15 ENCOUNTER — Other Ambulatory Visit: Payer: Self-pay

## 2023-12-15 ENCOUNTER — Other Ambulatory Visit: Payer: Self-pay | Admitting: Internal Medicine

## 2023-12-15 ENCOUNTER — Other Ambulatory Visit: Payer: Self-pay | Admitting: Pharmacy Technician

## 2023-12-15 DIAGNOSIS — C9210 Chronic myeloid leukemia, BCR/ABL-positive, not having achieved remission: Secondary | ICD-10-CM

## 2023-12-15 MED ORDER — DASATINIB 20 MG PO TABS
20.0000 mg | ORAL_TABLET | Freq: Every day | ORAL | 1 refills | Status: DC
  Filled 2023-12-15: qty 30, 30d supply, fill #0
  Filled 2023-12-21: qty 30, 30d supply, fill #1

## 2023-12-15 NOTE — Progress Notes (Signed)
 Specialty Pharmacy Refill Coordination Note  Kevin Love is a 78 y.o. male contacted today regarding refills of specialty medication(s) [name]   Patient requested Delivery   Delivery date: 12/20/23   Verified address: [address] [place]   Medication will be filled on 12/17/23.  RR sent to MD

## 2023-12-17 ENCOUNTER — Inpatient Hospital Stay (HOSPITAL_BASED_OUTPATIENT_CLINIC_OR_DEPARTMENT_OTHER): Payer: Medicare Other | Admitting: Internal Medicine

## 2023-12-17 ENCOUNTER — Other Ambulatory Visit: Payer: Self-pay

## 2023-12-17 ENCOUNTER — Encounter: Payer: Self-pay | Admitting: Internal Medicine

## 2023-12-17 DIAGNOSIS — C9210 Chronic myeloid leukemia, BCR/ABL-positive, not having achieved remission: Secondary | ICD-10-CM | POA: Diagnosis not present

## 2023-12-17 NOTE — Progress Notes (Signed)
 Patient's wife tells me that he has been more depressed lately, it is affecting his appetite and his daily activity. His diastolic number has been declining with each visit, today it was 48.

## 2023-12-17 NOTE — Progress Notes (Signed)
 Kevin Love CONSULT NOTE  Patient Care Team: Kevin Nurse, MD as PCP - General (Internal Medicine) Kevin Massed, MD as Consulting Physician (Hematology) Kevin Barter, MD as Consulting Physician (Oncology)  CURRENT TREATMENT-  Dasatinib 70 mg started on 08/13/20.  Dose reduced to 20 mg on 10/02/2020 due to fluid retention  ASSESSMENT & PLAN:  Kevin Love 78 y.o. male with pmh of hypertension, hyperlipidemia, A-fib on Eliquis, CML, CHF, Parkinson's disease, stroke, was seen at [facility] hematology on 08/17/2022 as a transfer of care from Kevin Love at [facility] due to close proximity to home for further management of CML.  #CML -Diagnosed 08/02/2020 when he presented with leukocytosis. -Marrow biopsy done on 08/13/2020 showed karyotype 46, XY t(9;22).  Hypercellular marrow with morphological features consistent with CML.  -Started on Dasatinib 70 mg on 08/13/2020.  Held on 09/09/2020 due to fluid retention.  Echo done on 09/10/2020 at [facility] showed normal LV size with EF 55%.  Mild concentric hypertrophy. mild to moderate MR.  -Dose reduced Dasatinib to 20 mg daily on 10/02/2020.  He has been tolerating it well.  Uses Lasix 20 mg as needed once every 2 weeks. -BCR/ABL PCR transcript  08/13/2021 0.0932 11/06/2021   0.0917 02/17/2022     0.1019 06/10/2022   0.2722 09/18/2022  <0.0032 01/15/2023   <0.0032 04/09/23      <0.0032 08/06/23    <0.0032 12/17/23  0.079  -Patient has been getting Dasatinib by compassionate care.  Continue with Dasatinib 20 mg daily.  He is tolerating well.   -BCR/ABL p210 transcript detectable at level of 0.079.  In October, it was undetectable.  His CBC is normal.  Considering there is no cytopenia with only a mild detectable level in the transcripts, we will continue with Dasatinib 20 mg daily for now.  And repeat labs in 2 months for closer monitoring.  # Depression -Referral to [facility] care  #Normocytic  anemia -Completed IV Feraheme 550 mg x 2 on 08/10/2022. 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  Endoscopy and capsule study unremarkable. -Hemoglobin 13.  Iron studies are normal.  #CVA/A-fib: - Continue Eliquis twice daily.   # Memory problems: - Continue memantine 10 mg 2 tablets daily.   # Difficulty urination: - Continue Flomax daily.   # ?Parkinson's disease: #Right arm tremor - Patient was seen end of January 2024 for fatigue, generalized weakness and tremors which was attributed to change in his medication by neurology.  Now he is off gabapentin and nortriptyline.  He is back on carbidopa and his symptoms have completely resolved. -Continue follow up with Dr. Hale Love, neurology   Orders Placed This Encounter  Procedures   CBC with Differential/Platelet    Standing Status:   Future    Expected Date:   01/31/2024    Expiration Date:   12/16/2024   Comprehensive metabolic panel    Standing Status:   Future    Expected Date:   01/31/2024    Expiration Date:   12/16/2024   BCR-ABL1, CML/ALL, PCR, QUANT    Standing Status:   Future    Expected Date:   01/31/2024    Expiration Date:   12/16/2024   RTC in 2 months for MD visit, labs 2 weeks prior  The total time spent in the appointment was 30 minutes encounter with patients including review of chart and various tests results, discussions about plan of care and coordination of care plan   All questions were answered. The  patient knows to call the clinic with any problems, questions or concerns. No barriers to learning was detected.  Kevin Barter, MD 2/28/20252:05 PM   HISTORY OF PRESENTING ILLNESS:  Kevin Love 78 y.o. male with pmh of hypertension, hyperlipidemia, A-fib on Eliquis, CML, CHF, Parkinson's disease, stroke, was seen at [facility] hematology on 08/17/2022 as a transfer of care from Kevin Love at [facility] due to close proximity to home for further management of CML.  INTERVAL HISTORY- Patient  was seen today accompanied by his wife for toxicity check for Dasatinib. Patient has been dealing with depression.  His family is situated in [place] and has been longing to visit them.  He has good days and bad days.  When he is feeling well his appetite is good but then at times not interested in pharmacologic intervention at this time.  Has been noticing more tremors in his right hand.   I have reviewed his chart and materials related to his cancer extensively and collaborated history with the patient. 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 Arma Love for hyperleukocytosis. -CBC on 07/23/2020 shows white count 148.3 with differential showing neutrophils, bands, promyelocytes, myelocytes and occasional blasts.  Platelet count was 138 and hemoglobin 12.9. -CBC on 03/05/2020 shows white count 15.2 with hemoglobin 15.4 and normal platelet count. -CBC in November 2020 showed white count 10.3 with normal hemoglobin and platelets.  -Dasatinib 70 mg daily started on 08/13/2020.  Held on 09/09/2020 due to fluid retention. -Bone marrow biopsy on 08/13/2020 with karyotype 46, XY,t(9;22), hypercellular bone marrow with morphological features consistent with CML. -2D echo on 09/10/2020 at [facility] showed normal LV size and function with EF 55%.  Mild concentric hypertrophy.  Normal right ventricle size with function.  Mild to moderate MR.  Trivial pericardial effusion.  No pericardial tamponade. -Dasatinib 20 mg daily started on 10/02/2020.   08/05/2020 Initial Diagnosis   CML (chronic myelocytic leukemia) [facility])     MEDICAL HISTORY:  Past Medical History:  Diagnosis Date   Anemia    Atrial fibrillation (HCC)    on Eliquis   CHF (congestive heart failure) (HCC)    Chronic Leukemia    Glaucoma    Hyperlipidemia     Hypertension    Parkinson disease (HCC) 07/25/2020   Stroke [facility])     SURGICAL HISTORY: Past Surgical History:  Procedure Laterality Date   BACK SURGERY     COLONOSCOPY WITH PROPOFOL N/A 06/03/2022   Procedure: COLONOSCOPY WITH PROPOFOL;  Surgeon: Regis Bill, MD;  Location: [facility] ENDOSCOPY;  Service: Endoscopy;  Laterality: N/A;   ESOPHAGOGASTRODUODENOSCOPY (EGD) WITH PROPOFOL N/A 06/03/2022   Procedure: ESOPHAGOGASTRODUODENOSCOPY (EGD) WITH PROPOFOL;  Surgeon: Regis Bill, MD;  Location: [facility] ENDOSCOPY;  Service: Endoscopy;  Laterality: N/A;   GIVENS CAPSULE STUDY N/A 06/04/2022   Procedure: GIVENS CAPSULE STUDY;  Surgeon: Regis Bill, MD;  Location: [facility] ENDOSCOPY;  Service: Endoscopy;  Laterality: N/A;   toe removed Right     SOCIAL HISTORY: Social History   Socioeconomic History   Marital status: Married    Spouse name: Not on file   Number of children: Not on file   Years of education: Not on file   Highest education level: Not on file  Occupational History   Not on file  Tobacco Use   Smoking status: Former  Types: Cigarettes   Smokeless tobacco: Never  Vaping Use   Vaping status: Never Used  Substance and Sexual Activity   Alcohol use: Never   Drug use: Never   Sexual activity: Not Currently  Other Topics Concern   Not on file  Social History Narrative   Not on file   Social Drivers of Health   Financial Resource Strain: Low Risk  (08/04/2023)   Received from [facility]   Overall Financial Resource Strain (CARDIA)    Difficulty of Paying Living Expenses: Not hard at all  Food Insecurity: No Food Insecurity (08/04/2023)   Received from [facility]   Hunger Vital Sign    Worried About Running Out of Food in the Last Year: Never true    Ran Out of Food in the Last Year: Never true  Transportation Needs: No Transportation Needs (08/04/2023)   Received from [facility]  Transportation    In the past 12 months, has lack of transportation kept you from medical appointments or from getting medications?: No    Lack of Transportation (Non-Medical): No  Physical Activity: Insufficiently Active (08/22/2020)   Exercise Vital Sign    Days of Exercise per Week: 7 days    Minutes of Exercise per Session: 10 min  Stress: No Stress Concern Present (08/22/2020)   [employer] of Occupational Health - Occupational Stress Questionnaire    Feeling of Stress : Not at all  Social Connections: Moderately Integrated (08/22/2020)   Social Connection and Isolation Panel [NHANES]    Frequency of Communication with Friends and Family: Three times a week    Frequency of Social Gatherings with Friends and Family: Once a week    Attends Religious Services: More than 4 times per year    Active Member of [employer] or Organizations: No    Attends Banker Meetings: Never    Marital Status: Married  Catering manager Violence: Not At Risk (08/22/2020)   Humiliation, Afraid, Rape, and Kick questionnaire    Fear of Current or Ex-Partner: No    Emotionally Abused: No    Physically Abused: No    Sexually Abused: No    FAMILY HISTORY: History reviewed. No pertinent family history.  ALLERGIES:  is allergic to penicillins.  MEDICATIONS:  Current Outpatient Medications  Medication Sig Dispense Refill   acetaminophen (TYLENOL) 500 MG tablet Take 1,000 mg by mouth every 6 (six) hours as needed for moderate pain.     apixaban (ELIQUIS) 5 MG TABS tablet Take 1 tablet (5 mg total) by mouth 2 (two) times daily. 60 tablet 0   atorvastatin (LIPITOR) 80 MG tablet Take 1 tablet (80 mg total) by mouth daily. 30 tablet 0   carbidopa-levodopa (PARCOPA) 25-100 MG disintegrating tablet Take 1 tablet by mouth 4 (four) times daily.     clonazePAM (KLONOPIN) 0.5 MG tablet Take 0.5 mg by mouth daily.     cyanocobalamin 1000 MCG tablet Take 1,000 mcg by mouth daily.     dasatinib (SPRYCEL) 20  MG tablet Take 1 tablet (20 mg total) by mouth daily. 30 tablet 1   furosemide (LASIX) 20 MG tablet Take 1 tablet (20 mg total) by mouth daily as needed (take daily in the morning as needed for ankle swelling). 30 tablet 1   HYDROcodone-acetaminophen (NORCO/VICODIN) 5-325 MG tablet Take 1 tablet by mouth every 6 (six) hours as needed for severe pain (pain score 7-10). 20 tablet 0   ketorolac (  ACULAR) 0.4 % SOLN Apply to eye.     latanoprost (XALATAN) 0.005 % ophthalmic solution Place 1 drop into the right eye at bedtime.     lisinopril (ZESTRIL) 20 MG tablet Take 20 mg by mouth daily.     memantine (NAMENDA) 10 MG tablet Take 10 mg by mouth 2 (two) times daily.     Multiple Vitamin (MULTIVITAMIN WITH MINERALS) TABS tablet Take 1 tablet by mouth daily.     predniSONE (DELTASONE) 5 MG tablet      tamsulosin (FLOMAX) 0.4 MG CAPS capsule Take 0.4 mg by mouth daily.     valACYclovir (VALTREX) 1000 MG tablet Take 1,000 mg by mouth 2 (two) times daily.     No current facility-administered medications for this visit.    REVIEW OF SYSTEMS:   Pertinent information mentioned in HPI All other systems were reviewed with the patient and are negative.  PHYSICAL EXAMINATION: ECOG PERFORMANCE STATUS: 1 - Symptomatic but completely ambulatory  There were no vitals filed for this visit.    There were no vitals filed for this visit.     GENERAL:alert, no distress and comfortable SKIN: skin color, texture, turgor are normal, no rashes or significant lesions EYES: normal, conjunctiva are pink and non-injected, sclera clear OROPHARYNX:no exudate, no erythema and lips, buccal mucosa, and tongue normal  NECK: supple, thyroid normal size, non-tender, without nodularity LYMPH:  no palpable lymphadenopathy in the cervical, axillary or inguinal LUNGS: clear to auscultation and percussion with normal breathing effort HEART: regular rate & rhythm and no murmurs and no lower extremity edema ABDOMEN:abdomen  soft, non-tender and normal bowel sounds Musculoskeletal:no cyanosis of digits and no clubbing  PSYCH: alert & oriented x 3 with fluent speech NEURO: no focal motor/sensory deficits  LABORATORY DATA:  I have reviewed the data as listed Lab Results  Component Value Date   WBC 7.4 12/03/2023   HGB 12.9 (L) 12/03/2023   HCT 38.1 (L) 12/03/2023   MCV 96.5 12/03/2023   PLT 176 12/03/2023   Recent Labs    04/09/23 1103 08/06/23 1252 12/03/23 1247  NA 140 140 137  K 3.8 4.5 4.0  CL 108 108 104  CO2 24 25 24   GLUCOSE 132* 101* 148*  BUN 15 17 17   CREATININE 0.76 0.76 0.91  CALCIUM 9.1 9.2 9.0  GFRNONAA >60 >60 >60  PROT 6.7 6.9 6.8  ALBUMIN 3.9 4.1 3.8  AST 23 23 20   ALT 32 15 9  ALKPHOS 61 68 72  BILITOT 0.7 0.6 0.7    RADIOGRAPHIC STUDIES: I have personally reviewed the radiological images as listed and agreed with the findings in the report. No results found.

## 2023-12-20 ENCOUNTER — Other Ambulatory Visit: Payer: Self-pay

## 2023-12-21 ENCOUNTER — Other Ambulatory Visit: Payer: Self-pay

## 2023-12-22 ENCOUNTER — Other Ambulatory Visit (HOSPITAL_COMMUNITY): Payer: Self-pay

## 2023-12-22 ENCOUNTER — Other Ambulatory Visit: Payer: Self-pay

## 2024-01-05 DIAGNOSIS — F4321 Adjustment disorder with depressed mood: Secondary | ICD-10-CM | POA: Diagnosis not present

## 2024-01-05 DIAGNOSIS — C9210 Chronic myeloid leukemia, BCR/ABL-positive, not having achieved remission: Secondary | ICD-10-CM | POA: Diagnosis not present

## 2024-01-05 NOTE — Progress Notes (Signed)
 [facility] Care Final Intake Summary  Verdon Cummins a.k.a. "Izek Corvino (14-Dec-1945) is a 78 y/o white male with CML (chronic myelocytic leukemia) since 07/2020 and other medical issues including Parkinson's Disease & stroke, referred by Dr. Alena Bills @ [facility] for depression. Onalee Hua is cautiously open to psychiatric medication recommendations.  Assessments:  Onalee Hua scored PHQ-9=13 (moderate), reporting anhedonia, depressed mood, fatigue, feeling he let his family down, slower speech, and passive suicidal ideation with CSSR-S=Low-Risk Onalee Hua scored GAD-7=1 (minimal), reporting concern around relationship strain in the family. Onalee Hua reported tendency to feel fidgety and finds significant relief in cleaning, particularly doing laundry.   Psychosocial Hx: Onalee Hua lives with spouse Darel Hong. He has two children from his first marriage who live 3 hours away in [place], and a stepdaughter. Onalee Hua attends church, though he hasn't connected with the male fellowship as well in [place] as he did in [place].   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

## 2024-01-09 ENCOUNTER — Encounter: Payer: Self-pay | Admitting: Hematology

## 2024-01-09 NOTE — Progress Notes (Signed)
 Patient enrolled in [facility] services on 12/22/23. Patient scheduled for initial behavioral health evaluation on 12/30/23.

## 2024-01-10 ENCOUNTER — Other Ambulatory Visit: Payer: Self-pay

## 2024-01-17 ENCOUNTER — Other Ambulatory Visit: Payer: Self-pay

## 2024-01-17 DIAGNOSIS — F4321 Adjustment disorder with depressed mood: Secondary | ICD-10-CM | POA: Insufficient documentation

## 2024-01-31 ENCOUNTER — Inpatient Hospital Stay: Payer: Medicare Other | Attending: Internal Medicine

## 2024-01-31 DIAGNOSIS — Z7901 Long term (current) use of anticoagulants: Secondary | ICD-10-CM | POA: Diagnosis not present

## 2024-01-31 DIAGNOSIS — R251 Tremor, unspecified: Secondary | ICD-10-CM | POA: Diagnosis not present

## 2024-01-31 DIAGNOSIS — Z8673 Personal history of transient ischemic attack (TIA), and cerebral infarction without residual deficits: Secondary | ICD-10-CM | POA: Insufficient documentation

## 2024-01-31 DIAGNOSIS — D649 Anemia, unspecified: Secondary | ICD-10-CM | POA: Insufficient documentation

## 2024-01-31 DIAGNOSIS — C9210 Chronic myeloid leukemia, BCR/ABL-positive, not having achieved remission: Secondary | ICD-10-CM | POA: Diagnosis present

## 2024-01-31 DIAGNOSIS — I4891 Unspecified atrial fibrillation: Secondary | ICD-10-CM | POA: Diagnosis not present

## 2024-01-31 DIAGNOSIS — F32A Depression, unspecified: Secondary | ICD-10-CM | POA: Insufficient documentation

## 2024-01-31 DIAGNOSIS — Z79899 Other long term (current) drug therapy: Secondary | ICD-10-CM | POA: Insufficient documentation

## 2024-01-31 DIAGNOSIS — Z87891 Personal history of nicotine dependence: Secondary | ICD-10-CM | POA: Insufficient documentation

## 2024-01-31 LAB — COMPREHENSIVE METABOLIC PANEL WITH GFR
ALT: 8 U/L (ref 0–44)
AST: 22 U/L (ref 15–41)
Albumin: 3.8 g/dL (ref 3.5–5.0)
Alkaline Phosphatase: 65 U/L (ref 38–126)
Anion gap: 7 (ref 5–15)
BUN: 17 mg/dL (ref 8–23)
CO2: 24 mmol/L (ref 22–32)
Calcium: 9 mg/dL (ref 8.9–10.3)
Chloride: 108 mmol/L (ref 98–111)
Creatinine, Ser: 0.84 mg/dL (ref 0.61–1.24)
GFR, Estimated: >60 mL/min (ref >60)
Glucose, Bld: 126 mg/dL — ABNORMAL HIGH (ref 70–99)
Potassium: 4.2 mmol/L (ref 3.5–5.1)
Sodium: 139 mmol/L (ref 135–145)
Total Bilirubin: 0.9 mg/dL (ref 0.0–1.2)
Total Protein: 6.5 g/dL (ref 6.5–8.1)

## 2024-01-31 LAB — CBC WITH DIFFERENTIAL/PLATELET
Abs Immature Granulocytes: 0.02 10*3/uL (ref 0.00–0.07)
Basophils Absolute: 0.1 10*3/uL (ref 0.0–0.1)
Basophils Relative: 1 %
Eosinophils Absolute: 0.2 10*3/uL (ref 0.0–0.5)
Eosinophils Relative: 3 %
HCT: 38 % — ABNORMAL LOW (ref 39.0–52.0)
Hemoglobin: 12.9 g/dL — ABNORMAL LOW (ref 13.0–17.0)
Immature Granulocytes: 0 %
Lymphocytes Relative: 29 %
Lymphs Abs: 1.7 10*3/uL (ref 0.7–4.0)
MCH: 32.7 pg (ref 26.0–34.0)
MCHC: 33.9 g/dL (ref 30.0–36.0)
MCV: 96.2 fL (ref 80.0–100.0)
Monocytes Absolute: 0.5 10*3/uL (ref 0.1–1.0)
Monocytes Relative: 9 %
Neutro Abs: 3.5 10*3/uL (ref 1.7–7.7)
Neutrophils Relative %: 58 %
Platelets: 182 10*3/uL (ref 150–400)
RBC: 3.95 MIL/uL — ABNORMAL LOW (ref 4.22–5.81)
RDW: 13.2 % (ref 11.5–15.5)
WBC: 6 10*3/uL (ref 4.0–10.5)
nRBC: 0 % (ref 0.0–0.2)

## 2024-02-01 ENCOUNTER — Other Ambulatory Visit: Payer: Self-pay

## 2024-02-03 ENCOUNTER — Other Ambulatory Visit: Payer: Self-pay | Admitting: Internal Medicine

## 2024-02-03 ENCOUNTER — Other Ambulatory Visit: Payer: Self-pay

## 2024-02-03 DIAGNOSIS — C9210 Chronic myeloid leukemia, BCR/ABL-positive, not having achieved remission: Secondary | ICD-10-CM

## 2024-02-03 LAB — BCR-ABL1, CML/ALL, PCR, QUANT
E1A2 Transcript: <0.0032 %
b2a2 transcript: <0.0032 %
b3a2 transcript: 0.0047 %

## 2024-02-03 MED ORDER — DASATINIB 20 MG PO TABS
20.0000 mg | ORAL_TABLET | Freq: Every day | ORAL | 1 refills | Status: DC
  Filled 2024-02-03: qty 30, 30d supply, fill #0
  Filled 2024-03-01: qty 30, 30d supply, fill #1

## 2024-02-03 NOTE — Progress Notes (Signed)
 Specialty Pharmacy Refill Coordination Note  Raghav Verrilli is a 78 y.o. male contacted today regarding refills of specialty medication(s) Dasatinib Zelia [facility]ick[facility])   Patient requested Delivery   Delivery date: 02/11/24   Verified address: [address] [place]   Medication will be filled on 02/10/24.

## 2024-02-03 NOTE — Telephone Encounter (Signed)
 Received request for RF for sprycel, but med hasn't been refill since 12/15/23

## 2024-02-03 NOTE — Progress Notes (Signed)
 Specialty Pharmacy Ongoing Clinical Assessment Note  Kevin Love is a 78 y.o. male who is being followed by the specialty pharmacy service for RxSp Oncology   Patient's specialty medication(s) reviewed today: Dasatinib (SPRYCEL)   Missed doses in the last 4 weeks: 0   Patient/Caregiver did not have any additional questions or concerns.   Therapeutic benefit summary: Patient is achieving benefit   Adverse events/side effects summary: No adverse events/side effects   Patient's therapy is appropriate to: Continue    Goals Addressed             This Visit's Progress    Achieve or maintain remission   On track    Patient is on track. Patient will maintain adherence         Follow up:  3 months  Bobbie Virden M Karmyn Lowman Specialty Pharmacist

## 2024-02-14 ENCOUNTER — Encounter: Payer: Self-pay | Admitting: Internal Medicine

## 2024-02-14 ENCOUNTER — Inpatient Hospital Stay (HOSPITAL_BASED_OUTPATIENT_CLINIC_OR_DEPARTMENT_OTHER): Payer: Medicare Other | Admitting: Internal Medicine

## 2024-02-14 DIAGNOSIS — C9210 Chronic myeloid leukemia, BCR/ABL-positive, not having achieved remission: Secondary | ICD-10-CM | POA: Diagnosis not present

## 2024-02-14 NOTE — Progress Notes (Signed)
 [facility] CONSULT NOTE  Patient Care Team: Little Riff, MD as PCP - General (Internal Medicine) Paulett Boros, MD as Consulting Physician (Hematology) Chidinma Clites, MD as Consulting Physician (Oncology)  CURRENT TREATMENT-  Dasatinib  70 mg started on 08/13/20.  Dose reduced to 20 mg on 10/02/2020 due to fluid retention  ASSESSMENT & PLAN:  Kevin Love 78 y.o. male with pmh of hypertension, hyperlipidemia, A-fib on Eliquis , CML, CHF, Parkinson's disease, stroke, was seen at [facility] hematology on 08/17/2022 as a transfer of care from Dr. Katragadda at [facility] due to close proximity to home for further management of CML.  #CML -Diagnosed 08/02/2020 when he presented with leukocytosis. -Marrow biopsy done on 08/13/2020 showed karyotype 46, XY t(9;22).  Hypercellular marrow with morphological features consistent with CML.  -Started on Dasatinib  70 mg on 08/13/2020.  Held on 09/09/2020 due to fluid retention.  Echo done on 09/10/2020 at [facility] showed normal LV size with EF 55%.  Mild concentric hypertrophy. mild to moderate MR.  -Dose reduced Dasatinib  to 20 mg daily on 10/02/2020.  He has been tolerating it well.  Uses Lasix  20 mg as needed once every 2 weeks. -BCR/ABL PCR transcript  08/13/2021 0.0932 11/06/2021   0.0917 02/17/2022     0.1019 06/10/2022   0.2722 09/18/2022  <0.0032 01/15/2023   <0.0032 04/09/23      <0.0032 08/06/23    <0.0032 12/17/23    0.079 02/14/24 - 0.0047  -Patient has been getting Dasatinib  by compassionate care.  Continue with Dasatinib  20 mg daily.  He is tolerating well.    -BCR/ABL p210 transcript detectable at level of 0.079 in Feb 2025.  Repeat BCR/ABL transcript from April 2025 has improved to 0.047.  Plan is to continue with Dasatinib  20 mg daily.  CBC is overall stable.  No cytopenias.  Follow-up in 3 months with repeat labs.  # Depression - Continue with similar care.  #Normocytic anemia -Completed IV  Feraheme 550 mg x 2 on 08/10/2022. 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  Endoscopy and capsule study unremarkable. -Hemoglobin 13.  Iron studies are normal.  #CVA/A-fib: - Continue Eliquis  twice daily.   # Memory problems: - Managed on memantine  and Aricept .   # Difficulty urination: - Continue Flomax  daily.   # ?Parkinson's disease: #Right arm tremor - Patient was seen end of January 2024 for fatigue, generalized weakness and tremors which was attributed to change in his medication by neurology.  Now he is off gabapentin and nortriptyline.  He is back on carbidopa  and his symptoms have completely resolved. -Continue follow up with Dr. Alexia Angelucci, neurology   Orders Placed This Encounter  Procedures   CBC with Differential (Cancer Center Only)    Standing Status:   Future    Expected Date:   05/15/2024    Expiration Date:   02/13/2025   CMP (Cancer Center only)    Standing Status:   Future    Expected Date:   05/15/2024    Expiration Date:   02/13/2025   BCR-ABL1, CML/ALL, PCR, QUANT    Standing Status:   Future    Expected Date:   05/15/2024    Expiration Date:   02/13/2025   RTC in 3 months for MD visit, labs 2 weeks prior  The total time spent in the appointment was 30 minutes encounter with patients including review of chart and various tests results, discussions about plan of care and coordination of care plan   All questions  were answered. The patient knows to call the clinic with any problems, questions or concerns. No barriers to learning was detected.  Kevin Rodney, MD 4/28/20252:50 PM   HISTORY OF PRESENTING ILLNESS:  Kevin Love 78 y.o. male with pmh of hypertension, hyperlipidemia, A-fib on Eliquis , CML, CHF, Parkinson's disease, stroke, was seen at [facility] hematology on 08/17/2022 as a transfer of care from Dr. Katragadda at [facility] due to close proximity to home for further management of CML.  INTERVAL HISTORY- Patient was seen today  accompanied by his wife for toxicity check for Dasatinib . Patient is tolerating Dasatinib  well.  He was recently started on Aricept  by Dr. Alexia Angelucci with changes in his memory. Reports feeling depressed.  He is following with [facility].  Denies much improvement in his mental wellbeing.   I have reviewed his chart and materials related to his cancer extensively and collaborated history with the patient. 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 Jeanelle Milch for hyperleukocytosis. -CBC on 07/23/2020 shows white count 148.3 with differential showing neutrophils, bands, promyelocytes, myelocytes and occasional blasts.  Platelet count was 138 and hemoglobin 12.9. -CBC on 03/05/2020 shows white count 15.2 with hemoglobin 15.4 and normal platelet count. -CBC in November 2020 showed white count 10.3 with normal hemoglobin and platelets.  -Dasatinib  70 mg daily started on 08/13/2020.  Held on 09/09/2020 due to fluid retention. -Bone marrow biopsy on 08/13/2020 with karyotype 46, XY,t(9;22), hypercellular bone marrow with morphological features consistent with CML. -2D echo on 09/10/2020 at [facility] showed normal LV size and function with EF 55%.  Mild concentric hypertrophy.  Normal right ventricle size with function.  Mild to moderate MR.  Trivial pericardial effusion.  No pericardial tamponade. -Dasatinib  20 mg daily started on 10/02/2020.   08/05/2020 Initial Diagnosis   CML (chronic myelocytic leukemia) [facility])     MEDICAL HISTORY:  Past Medical History:  Diagnosis Date   Anemia    Atrial fibrillation (HCC)    on Eliquis    CHF (congestive heart failure) (HCC)    Chronic Leukemia    Glaucoma    Hyperlipidemia    Hypertension    Parkinson disease (HCC) 07/25/2020   Stroke [facility])     SURGICAL HISTORY: Past Surgical  History:  Procedure Laterality Date   BACK SURGERY     COLONOSCOPY WITH PROPOFOL  N/A 06/03/2022   Procedure: COLONOSCOPY WITH PROPOFOL ;  Surgeon: Shane Darling, MD;  Location: [facility] ENDOSCOPY;  Service: Endoscopy;  Laterality: N/A;   ESOPHAGOGASTRODUODENOSCOPY (EGD) WITH PROPOFOL  N/A 06/03/2022   Procedure: ESOPHAGOGASTRODUODENOSCOPY (EGD) WITH PROPOFOL ;  Surgeon: Shane Darling, MD;  Location: [facility] ENDOSCOPY;  Service: Endoscopy;  Laterality: N/A;   GIVENS CAPSULE STUDY N/A 06/04/2022   Procedure: GIVENS CAPSULE STUDY;  Surgeon: Shane Darling, MD;  Location: [facility] ENDOSCOPY;  Service: Endoscopy;  Laterality: N/A;   toe removed Right     SOCIAL HISTORY: Social History   Socioeconomic History   Marital status: Married    Spouse name: Not on file   Number of children: Not on file   Years of education: Not on file   Highest education level: Not on file  Occupational History   Not on file  Tobacco Use   Smoking status: Former    Types: Cigarettes   Smokeless tobacco: Never  Vaping Use   Vaping status: Never Used  Substance and Sexual Activity   Alcohol use: Never   Drug use: Never   Sexual activity: Not Currently  Other Topics Concern   Not on file  Social History Narrative   Not on file   Social Drivers of Health   Financial Resource Strain: Low Risk  (08/04/2023)   Received from [facility]   Overall Financial Resource Strain (CARDIA)    Difficulty of Paying Living Expenses: Not hard at all  Food Insecurity: No Food Insecurity (08/04/2023)   Received from [facility]   Hunger Vital Sign    Worried About Running Out of Food in the Last Year: Never true    Ran Out of Food in the Last Year: Never true  Transportation Needs: No Transportation Needs (08/04/2023)   Received from [facility] - Transportation    In the past 12 months, has lack of transportation kept you from medical appointments or  from getting medications?: No    Lack of Transportation (Non-Medical): No  Physical Activity: Insufficiently Active (08/22/2020)   Exercise Vital Sign    Days of Exercise per Week: 7 days    Minutes of Exercise per Session: 10 min  Stress: No Stress Concern Present (08/22/2020)   [employer] of Occupational Health - Occupational Stress Questionnaire    Feeling of Stress : Not at all  Social Connections: Moderately Integrated (08/22/2020)   Social Connection and Isolation Panel [NHANES]    Frequency of Communication with Friends and Family: Three times a week    Frequency of Social Gatherings with Friends and Family: Once a week    Attends Religious Services: More than 4 times per year    Active Member of [employer] or Organizations: No    Attends Banker Meetings: Never    Marital Status: Married  Catering manager Violence: Not At Risk (08/22/2020)   Humiliation, Afraid, Rape, and Kick questionnaire    Fear of Current or Ex-Partner: No    Emotionally Abused: No    Physically Abused: No    Sexually Abused: No    FAMILY HISTORY: History reviewed. No pertinent family history.  ALLERGIES:  is allergic to penicillins.  MEDICATIONS:  Current Outpatient Medications  Medication Sig Dispense Refill   acetaminophen  (TYLENOL ) 500 MG tablet Take 1,000 mg by mouth every 6 (six) hours as needed for moderate pain.     apixaban  (ELIQUIS ) 5 MG TABS tablet Take 1 tablet (5 mg total) by mouth 2 (two) times daily. 60 tablet 0   aspirin  EC (BAYER ASPIRIN  EC LOW DOSE) 81 MG tablet Take 81 mg by mouth.     atorvastatin  (LIPITOR ) 80 MG tablet Take 1 tablet (80 mg total) by mouth daily. 30 tablet 0   carbidopa -levodopa  (PARCOPA ) 25-100 MG disintegrating tablet Take 1 tablet by mouth 4 (four) times daily.     cephALEXin (KEFLEX) 500 MG capsule Take 500 mg by mouth every 6 (six) hours.     clonazePAM  (KLONOPIN ) 0.5 MG tablet Take 0.5 mg by mouth daily.     cyanocobalamin  1000 MCG tablet Take  1,000 mcg by mouth daily.     dasatinib  (SPRYCEL ) 20 MG tablet Take 1 tablet (20 mg total) by mouth daily. 30 tablet 1   doxycycline (VIBRAMYCIN) 100 MG capsule Take 100 mg by mouth 2 (two) times daily.     furosemide  (LASIX ) 20 MG tablet Take 1 tablet (20 mg total) by mouth daily as needed (take daily in the morning  as needed for ankle swelling). 30 tablet 1   HYDROcodone -acetaminophen  (NORCO/VICODIN) 5-325 MG tablet Take 1 tablet by mouth every 6 (six) hours as needed for severe pain (pain score 7-10). 20 tablet 0   ketorolac (ACULAR) 0.4 % SOLN Apply to eye.     latanoprost  (XALATAN ) 0.005 % ophthalmic solution Place 1 drop into the right eye at bedtime.     lisinopril  (ZESTRIL ) 20 MG tablet Take 20 mg by mouth daily. 1/2 once a day     memantine  (NAMENDA ) 10 MG tablet Take 10 mg by mouth 2 (two) times daily.     Multiple Vitamin (MULTIVITAMIN WITH MINERALS) TABS tablet Take 1 tablet by mouth daily.     predniSONE (DELTASONE) 5 MG tablet      tamsulosin  (FLOMAX ) 0.4 MG CAPS capsule Take 0.4 mg by mouth daily.     valACYclovir (VALTREX) 1000 MG tablet Take 1,000 mg by mouth 2 (two) times daily.     No current facility-administered medications for this visit.    REVIEW OF SYSTEMS:   Pertinent information mentioned in HPI All other systems were reviewed with the patient and are negative.  PHYSICAL EXAMINATION: ECOG PERFORMANCE STATUS: 1 - Symptomatic but completely ambulatory  Vitals:   02/14/24 1333  BP: (!) 118/58  Pulse: 62  Resp: 14  Temp: 97.6 F (36.4 C)  SpO2: 97%      Filed Weights   02/14/24 1333  Weight: 151 lb (68.5 kg)       GENERAL:alert, no distress and comfortable SKIN: skin color, texture, turgor are normal, no rashes or significant lesions EYES: normal, conjunctiva are pink and non-injected, sclera clear OROPHARYNX:no exudate, no erythema and lips, buccal mucosa, and tongue normal  NECK: supple, thyroid  normal size, non-tender, without  nodularity LYMPH:  no palpable lymphadenopathy in the cervical, axillary or inguinal LUNGS: clear to auscultation and percussion with normal breathing effort HEART: regular rate & rhythm and no murmurs and no lower extremity edema ABDOMEN:abdomen soft, non-tender and normal bowel sounds Musculoskeletal:no cyanosis of digits and no clubbing  PSYCH: alert & oriented x 3 with fluent speech NEURO: no focal motor/sensory deficits  LABORATORY DATA:  I have reviewed the data as listed Lab Results  Component Value Date   WBC 6.0 01/31/2024   HGB 12.9 (L) 01/31/2024   HCT 38.0 (L) 01/31/2024   MCV 96.2 01/31/2024   PLT 182 01/31/2024   Recent Labs    08/06/23 1252 12/03/23 1247 01/31/24 1259  NA 140 137 139  K 4.5 4.0 4.2  CL 108 104 108  CO2 25 24 24   GLUCOSE 101* 148* 126*  BUN 17 17 17   CREATININE 0.76 0.91 0.84  CALCIUM  9.2 9.0 9.0  GFRNONAA >60 >60 >60  PROT 6.9 6.8 6.5  ALBUMIN 4.1 3.8 3.8  AST 23 20 22   ALT 15 9 8   ALKPHOS 68 72 65  BILITOT 0.6 0.7 0.9    RADIOGRAPHIC STUDIES: I have personally reviewed the radiological images as listed and agreed with the findings in the report. No results found.

## 2024-02-14 NOTE — Progress Notes (Signed)
 Patient is on a couple of new medications. He is still fatigued and weak. He has started seeing a phycologist for his depression, which he told me that he has good days and he has bad days.

## 2024-02-18 ENCOUNTER — Other Ambulatory Visit: Payer: Self-pay

## 2024-02-28 ENCOUNTER — Other Ambulatory Visit: Payer: Self-pay

## 2024-03-01 ENCOUNTER — Other Ambulatory Visit: Payer: Self-pay

## 2024-03-01 ENCOUNTER — Other Ambulatory Visit: Payer: Self-pay | Admitting: Pharmacy Technician

## 2024-03-01 NOTE — Progress Notes (Signed)
 Specialty Pharmacy Refill Coordination Note  Kevin Love is a 78 y.o. male contacted today regarding refills of specialty medication(s) Dasatinib  (SPRYCEL )  Spoke with Wife  Patient requested Delivery   Delivery date: 03/08/24   Verified address: [address]  [place]   Medication will be filled on 03/07/24.

## 2024-03-07 ENCOUNTER — Other Ambulatory Visit: Payer: Self-pay

## 2024-03-18 DIAGNOSIS — F4321 Adjustment disorder with depressed mood: Secondary | ICD-10-CM | POA: Diagnosis not present

## 2024-03-18 DIAGNOSIS — C9210 Chronic myeloid leukemia, BCR/ABL-positive, not having achieved remission: Secondary | ICD-10-CM | POA: Diagnosis not present

## 2024-03-23 ENCOUNTER — Inpatient Hospital Stay: Attending: Internal Medicine

## 2024-03-23 NOTE — Progress Notes (Signed)
 [facility] Clinical Social Work  Clinical Social Work was referred by [employer] of psychosocial needs of caregiver.  Clinical Social Worker contacted caregiver by phone to offer support and assess for needs.    Patient's wife, Kevin Love, discussed her adjustment issues related to patient's illness.  He prefers to remain with her most of the time and becomes anxious if she leaves the home alone.  Kevin Love expressed having a strong faith and a very supportive church.  Her daughter visits about three times per week.  The couple moved from [place], [place], two years ago so that she could be near family.  They have been married 34 years.  He has a son and daughter from a previous married, but they do not live in the area.  Discussed self care.     Kevin Peal, LCSW  Clinical Social Worker [facility]

## 2024-04-04 ENCOUNTER — Other Ambulatory Visit: Payer: Self-pay

## 2024-04-06 ENCOUNTER — Other Ambulatory Visit: Payer: Self-pay | Admitting: Nurse Practitioner

## 2024-04-06 ENCOUNTER — Other Ambulatory Visit: Payer: Self-pay | Admitting: Pharmacy Technician

## 2024-04-06 ENCOUNTER — Other Ambulatory Visit: Payer: Self-pay

## 2024-04-06 DIAGNOSIS — C9210 Chronic myeloid leukemia, BCR/ABL-positive, not having achieved remission: Secondary | ICD-10-CM

## 2024-04-06 MED ORDER — DASATINIB 20 MG PO TABS
20.0000 mg | ORAL_TABLET | Freq: Every day | ORAL | 1 refills | Status: DC
  Filled 2024-04-07: qty 30, 30d supply, fill #0
  Filled 2024-05-03 – 2024-05-04 (×2): qty 30, 30d supply, fill #1

## 2024-04-06 NOTE — Progress Notes (Addendum)
 Specialty Pharmacy Refill Coordination Note  Kevin Love is a 78 y.o. male contacted today regarding refills of specialty medication(s) Dasatinib  (SPRYCEL )   Patient requested Delivery   Delivery date: 04/12/24   Verified address: [address] [name] [address]   Medication will be filled on 04/11/24.    This fill date is pending response to refill request from provider. Patient is aware and if they have not received fill by intended date they must follow up with pharmacy.   Spoke to patient's spouse.

## 2024-04-07 ENCOUNTER — Other Ambulatory Visit: Payer: Self-pay

## 2024-04-10 ENCOUNTER — Telehealth: Payer: Self-pay

## 2024-04-10 ENCOUNTER — Other Ambulatory Visit: Payer: Self-pay

## 2024-04-10 DIAGNOSIS — Z862 Personal history of diseases of the blood and blood-forming organs and certain disorders involving the immune mechanism: Secondary | ICD-10-CM

## 2024-04-10 DIAGNOSIS — C9210 Chronic myeloid leukemia, BCR/ABL-positive, not having achieved remission: Secondary | ICD-10-CM

## 2024-04-10 NOTE — Telephone Encounter (Signed)
 Wife called stating patient is losing weight despite still eating. She is requesting patient to have labs and be seen by MD sooner then Aug. No other symptoms. Requesting call back

## 2024-04-10 NOTE — Telephone Encounter (Signed)
 See me on 6/30 or 7/1. Cbc with diff, cmp, tsh, bcr abl pcr testing

## 2024-04-11 ENCOUNTER — Other Ambulatory Visit: Payer: Self-pay

## 2024-04-18 ENCOUNTER — Encounter: Payer: Self-pay | Admitting: Oncology

## 2024-04-18 ENCOUNTER — Inpatient Hospital Stay: Attending: Internal Medicine

## 2024-04-18 ENCOUNTER — Inpatient Hospital Stay (HOSPITAL_BASED_OUTPATIENT_CLINIC_OR_DEPARTMENT_OTHER): Admitting: Oncology

## 2024-04-18 VITALS — BP 119/61 | HR 68 | Temp 97.7°F | Resp 16 | Wt 150.0 lb

## 2024-04-18 DIAGNOSIS — Z862 Personal history of diseases of the blood and blood-forming organs and certain disorders involving the immune mechanism: Secondary | ICD-10-CM

## 2024-04-18 DIAGNOSIS — Z79899 Other long term (current) drug therapy: Secondary | ICD-10-CM

## 2024-04-18 DIAGNOSIS — C9210 Chronic myeloid leukemia, BCR/ABL-positive, not having achieved remission: Secondary | ICD-10-CM

## 2024-04-18 DIAGNOSIS — Z87891 Personal history of nicotine dependence: Secondary | ICD-10-CM | POA: Diagnosis not present

## 2024-04-18 LAB — CBC WITH DIFFERENTIAL (CANCER CENTER ONLY)
Abs Immature Granulocytes: 0.01 10*3/uL (ref 0.00–0.07)
Basophils Absolute: 0.1 10*3/uL (ref 0.0–0.1)
Basophils Relative: 1 %
Eosinophils Absolute: 0.2 10*3/uL (ref 0.0–0.5)
Eosinophils Relative: 3 %
HCT: 39.3 % (ref 39.0–52.0)
Hemoglobin: 13.3 g/dL (ref 13.0–17.0)
Immature Granulocytes: 0 %
Lymphocytes Relative: 27 %
Lymphs Abs: 1.6 10*3/uL (ref 0.7–4.0)
MCH: 32.6 pg (ref 26.0–34.0)
MCHC: 33.8 g/dL (ref 30.0–36.0)
MCV: 96.3 fL (ref 80.0–100.0)
Monocytes Absolute: 0.4 10*3/uL (ref 0.1–1.0)
Monocytes Relative: 7 %
Neutro Abs: 3.7 10*3/uL (ref 1.7–7.7)
Neutrophils Relative %: 62 %
Platelet Count: 173 10*3/uL (ref 150–400)
RBC: 4.08 MIL/uL — ABNORMAL LOW (ref 4.22–5.81)
RDW: 13 % (ref 11.5–15.5)
WBC Count: 6 10*3/uL (ref 4.0–10.5)
nRBC: 0 % (ref 0.0–0.2)

## 2024-04-18 LAB — CMP (CANCER CENTER ONLY)
ALT: 10 U/L (ref 0–44)
AST: 25 U/L (ref 15–41)
Albumin: 3.8 g/dL (ref 3.5–5.0)
Alkaline Phosphatase: 76 U/L (ref 38–126)
Anion gap: 6 (ref 5–15)
BUN: 17 mg/dL (ref 8–23)
CO2: 25 mmol/L (ref 22–32)
Calcium: 9 mg/dL (ref 8.9–10.3)
Chloride: 106 mmol/L (ref 98–111)
Creatinine: 0.9 mg/dL (ref 0.61–1.24)
GFR, Estimated: >60 mL/min (ref >60)
Glucose, Bld: 142 mg/dL — ABNORMAL HIGH (ref 70–99)
Potassium: 4.1 mmol/L (ref 3.5–5.1)
Sodium: 137 mmol/L (ref 135–145)
Total Bilirubin: 1 mg/dL (ref 0.0–1.2)
Total Protein: 6.3 g/dL — ABNORMAL LOW (ref 6.5–8.1)

## 2024-04-18 LAB — TSH: TSH: 2.281 u[IU]/mL (ref 0.350–4.500)

## 2024-04-18 NOTE — Progress Notes (Signed)
 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  CML was diagnosed in October 2021 when he was seen by Dr. Rogers for evaluation of leukocytosis.  Bone marrow biopsy showed an 9:22 translocation and hypercellular bone marrow with morphologic features consistent with CML.  He was started on Dasatinib  70 mg in October 2021.  He had issues with fluid retention in November 2021 and subsequently Dasatinib  dose was reduced to 20 mg and he has been on the same dose since then.  He uses Lasix  as needed for his leg swelling.  BCR-ABL transcripts have remained detectable but not trending up.He gets Dasatinib  through compassionate care.  He is on memantine  for his Alzheimer's  Interval history-he is doing well for his age although he reports feeling more forgetful of late.  Occasional bruising that he has noticed over bilateral forearms.  ECOG PS- 2 Pain scale- 0   Review of systems- Review of Systems  Constitutional:  Positive for malaise/fatigue. Negative for chills, fever and weight loss.  HENT:  Negative for congestion, ear discharge and nosebleeds.   Eyes:  Negative for blurred vision.  Respiratory:  Negative for cough, hemoptysis, sputum production, shortness of breath and wheezing.   Cardiovascular:  Negative for chest pain, palpitations,  orthopnea and claudication.  Gastrointestinal:  Negative for abdominal pain, blood in stool, constipation, diarrhea, heartburn, melena, nausea and vomiting.  Genitourinary:  Negative for dysuria, flank pain, frequency, hematuria and urgency.  Musculoskeletal:  Negative for back pain, joint pain and myalgias.  Skin:  Negative for rash.  Neurological:  Negative for dizziness, tingling, focal weakness, seizures, weakness and headaches.  Endo/Heme/Allergies:  Does not bruise/bleed easily.  Psychiatric/Behavioral:  Negative for depression and suicidal ideas. The patient does not have insomnia.       Allergies  Allergen Reactions   Penicillins Rash     Past Medical History:  Diagnosis Date   Anemia    Atrial fibrillation (HCC)    on Eliquis    CHF (congestive heart failure) (HCC)    Chronic Leukemia    Glaucoma    Hyperlipidemia    Hypertension    Parkinson disease (HCC) 07/25/2020   Stroke [facility])      Past Surgical History:  Procedure Laterality Date   BACK SURGERY     COLONOSCOPY WITH PROPOFOL  N/A 06/03/2022   Procedure: COLONOSCOPY WITH PROPOFOL ;  Surgeon: Maryruth Ole DASEN, MD;  Location: [facility] ENDOSCOPY;  Service: Endoscopy;  Laterality: N/A;   ESOPHAGOGASTRODUODENOSCOPY (EGD) WITH PROPOFOL  N/A 06/03/2022   Procedure: ESOPHAGOGASTRODUODENOSCOPY (EGD) WITH PROPOFOL ;  Surgeon: Maryruth Ole DASEN, MD;  Location: [facility] ENDOSCOPY;  Service: Endoscopy;  Laterality: N/A;   GIVENS CAPSULE STUDY N/A 06/04/2022   Procedure: GIVENS CAPSULE STUDY;  Surgeon: Maryruth Ole DASEN, MD;  Location: [facility] ENDOSCOPY;  Service: Endoscopy;  Laterality: N/A;   toe removed Right     Social History   Socioeconomic History   Marital status: Married  Spouse name: Not on file   Number of children: Not on file   Years of education: Not on file   Highest education level: Not on file  Occupational History   Not on file  Tobacco Use   Smoking status: Former    Types: Cigarettes   Smokeless  tobacco: Never  Vaping Use   Vaping status: Never Used  Substance and Sexual Activity   Alcohol use: Never   Drug use: Never   Sexual activity: Not Currently  Other Topics Concern   Not on file  Social History Narrative   Not on file   Social Drivers of Health   Financial Resource Strain: Low Risk  (08/04/2023)   Received from [facility]   Overall Financial Resource Strain (CARDIA)    Difficulty of Paying Living Expenses: Not hard at all  Food Insecurity: No Food Insecurity (08/04/2023)   Received from [facility]   Hunger Vital Sign    Within the past 12 months, you worried that your food would run out before you got the money to buy more.: Never true    Within the past 12 months, the food you bought just didn't last and you didn't have money to get more.: Never true  Transportation Needs: No Transportation Needs (08/04/2023)   Received from [facility] - Transportation    In the past 12 months, has lack of transportation kept you from medical appointments or from getting medications?: No    Lack of Transportation (Non-Medical): No  Physical Activity: Insufficiently Active (08/22/2020)   Exercise Vital Sign    Days of Exercise per Week: 7 days    Minutes of Exercise per Session: 10 min  Stress: No Stress Concern Present (08/22/2020)   [employer] of Occupational Health - Occupational Stress Questionnaire    Feeling of Stress : Not at all  Social Connections: Moderately Integrated (08/22/2020)   Social Connection and Isolation Panel    Frequency of Communication with Friends and Family: Three times a week    Frequency of Social Gatherings with Friends and Family: Once a week    Attends Religious Services: More than 4 times per year    Active Member of [employer] or Organizations: No    Attends Banker Meetings: Never    Marital Status: Married  Catering manager Violence: Not At Risk (08/22/2020)    Humiliation, Afraid, Rape, and Kick questionnaire    Fear of Current or Ex-Partner: No    Emotionally Abused: No    Physically Abused: No    Sexually Abused: No    History reviewed. No pertinent family history.   Current Outpatient Medications:    acetaminophen  (TYLENOL ) 500 MG tablet, Take 1,000 mg by mouth every 6 (six) hours as needed for moderate pain., Disp: , Rfl:    apixaban  (ELIQUIS ) 5 MG TABS tablet, Take 1 tablet (5 mg total) by mouth 2 (two) times daily., Disp: 60 tablet, Rfl: 0   aspirin  EC (BAYER ASPIRIN  EC LOW DOSE) 81 MG tablet, Take 81 mg by mouth., Disp: , Rfl:    atorvastatin  (LIPITOR ) 80 MG tablet, Take 1 tablet (80 mg total) by mouth daily., Disp: 30 tablet, Rfl: 0   carbidopa -levodopa  (PARCOPA ) 25-100 MG disintegrating tablet, Take 1 tablet by mouth 4 (four) times daily., Disp: , Rfl:    cephALEXin (KEFLEX) 500 MG capsule, Take 500 mg by mouth every 6 (six) hours., Disp: , Rfl:    clonazePAM  (KLONOPIN )  0.5 MG tablet, Take 0.5 mg by mouth daily., Disp: , Rfl:    cyanocobalamin  1000 MCG tablet, Take 1,000 mcg by mouth daily., Disp: , Rfl:    dasatinib  (SPRYCEL ) 20 MG tablet, Take 1 tablet (20 mg total) by mouth daily., Disp: 30 tablet, Rfl: 1   doxycycline (VIBRAMYCIN) 100 MG capsule, Take 100 mg by mouth 2 (two) times daily., Disp: , Rfl:    furosemide  (LASIX ) 20 MG tablet, Take 1 tablet (20 mg total) by mouth daily as needed (take daily in the morning as needed for ankle swelling)., Disp: 30 tablet, Rfl: 1   HYDROcodone -acetaminophen  (NORCO/VICODIN) 5-325 MG tablet, Take 1 tablet by mouth every 6 (six) hours as needed for severe pain (pain score 7-10)., Disp: 20 tablet, Rfl: 0   ketorolac (ACULAR) 0.4 % SOLN, Apply to eye., Disp: , Rfl:    latanoprost  (XALATAN ) 0.005 % ophthalmic solution, Place 1 drop into the right eye at bedtime., Disp: , Rfl:    lisinopril  (ZESTRIL ) 20 MG tablet, Take 20 mg by mouth daily. 1/2 once a day, Disp: , Rfl:    memantine  (NAMENDA ) 10 MG  tablet, Take 10 mg by mouth 2 (two) times daily., Disp: , Rfl:    Multiple Vitamin (MULTIVITAMIN WITH MINERALS) TABS tablet, Take 1 tablet by mouth daily., Disp: , Rfl:    predniSONE (DELTASONE) 5 MG tablet, , Disp: , Rfl:    tamsulosin  (FLOMAX ) 0.4 MG CAPS capsule, Take 0.4 mg by mouth daily., Disp: , Rfl:    valACYclovir (VALTREX) 1000 MG tablet, Take 1,000 mg by mouth 2 (two) times daily., Disp: , Rfl:   Physical exam:  Vitals:   04/18/24 0937  BP: 119/61  Pulse: 68  Resp: 16  Temp: 97.7 F (36.5 C)  TempSrc: Tympanic  SpO2: 97%  Weight: 150 lb (68 kg)   Physical Exam  Cardiovascular:     Rate and Rhythm: Normal rate. Rhythm irregular.     Heart sounds: Normal heart sounds.  Pulmonary:     Effort: Pulmonary effort is normal.     Breath sounds: Normal breath sounds.  Abdominal:     General: Bowel sounds are normal.     Palpations: Abdomen is soft.     Comments: No palpable hepatosplenomegaly   Musculoskeletal:     Right lower leg: No edema.     Left lower leg: No edema.   Skin:    General: Skin is warm and dry.   Neurological:     Mental Status: He is alert and oriented to person, place, and time.      I have personally reviewed labs listed below:    Latest Ref Rng & Units 04/18/2024    9:25 AM  CMP  Glucose 70 - 99 mg/dL 857   BUN 8 - 23 mg/dL 17   Creatinine 9.38 - 1.24 mg/dL 9.09   Sodium 864 - 854 mmol/L 137   Potassium 3.5 - 5.1 mmol/L 4.1   Chloride 98 - 111 mmol/L 106   CO2 22 - 32 mmol/L 25   Calcium  8.9 - 10.3 mg/dL 9.0   Total Protein 6.5 - 8.1 g/dL 6.3   Total Bilirubin 0.0 - 1.2 mg/dL 1.0   Alkaline Phos 38 - 126 U/L 76   AST 15 - 41 U/L 25   ALT 0 - 44 U/L 10       Latest Ref Rng & Units 04/18/2024    9:25 AM  CBC  WBC 4.0 - 10.5 K/uL 6.0  Hemoglobin 13.0 - 17.0 g/dL 86.6   Hematocrit 60.9 - 52.0 % 39.3   Platelets 150 - 400 K/uL 173       Assessment and plan- Patient is a 78 y.o. male with history of chronic phase CML currently on  Dasatinib  here for routine follow-up  White count and hemoglobin as well as platelet counts are within normal limits.  CMP is unremarkable.  BCR-ABL PCR testing is pending and last result from April 2025 showed B3A2 transcript of 0.0047 which was lower as compared to 0.07 noted in February 2025.  Plan is to continue Dasatinib  until progression or toxicity.  I will see him back in 3 months with labs 2 weeks prior   Visit Diagnosis 1. CML (chronic myelocytic leukemia) (HCC)   2. High risk medication use      Dr. Annah Skene, MD, MPH [facility] [phone] 04/18/2024 12:21 PM

## 2024-04-20 ENCOUNTER — Other Ambulatory Visit: Payer: Self-pay

## 2024-04-22 IMAGING — CT CT HEAD W/O CM
4 series · 16 of 47 positions shown, 18 images · non-contrast
Comparison: None.

CLINICAL DATA: Neurological deficit, slurred speech, left arm
numbness



[Series 2: head bone · axial · 0.44mm/px · z∈[-114,-84]mm · 3 of 77 slices shown]
[im 8/77  bone]
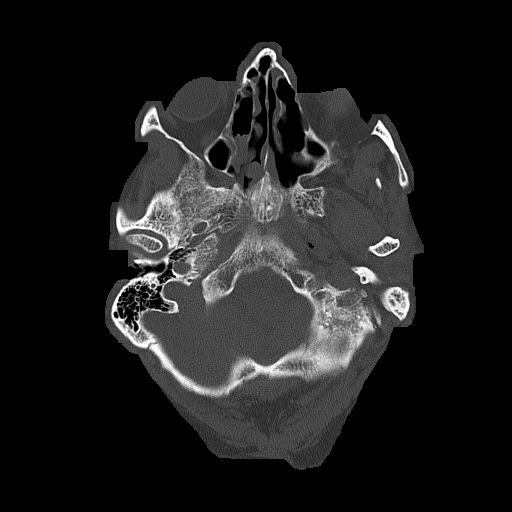
[im 16/77  bone]
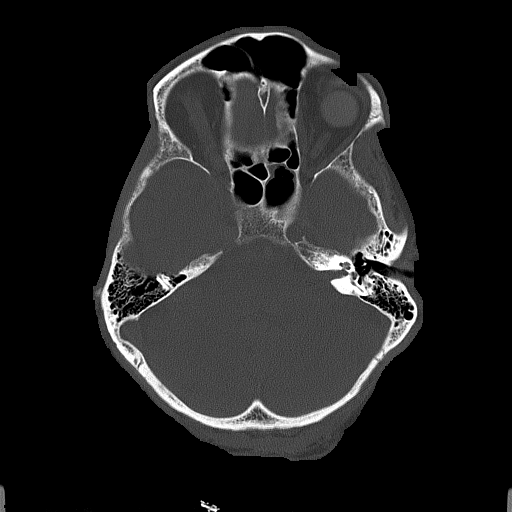
[im 23/77  bone]
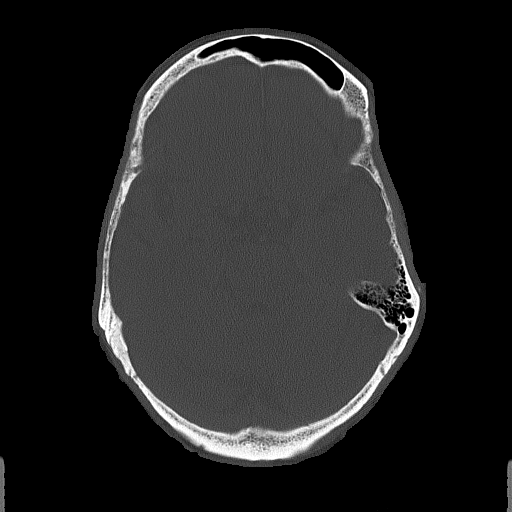

[Series 3: head wo · axial · 0.44mm/px · z∈[-113,+2]mm · 7 of 31 slices shown, 9 images]
[im 4/31  brain]
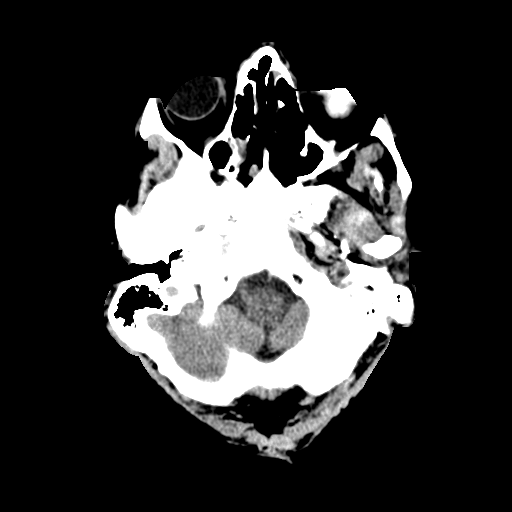
[im 4/31  bone]
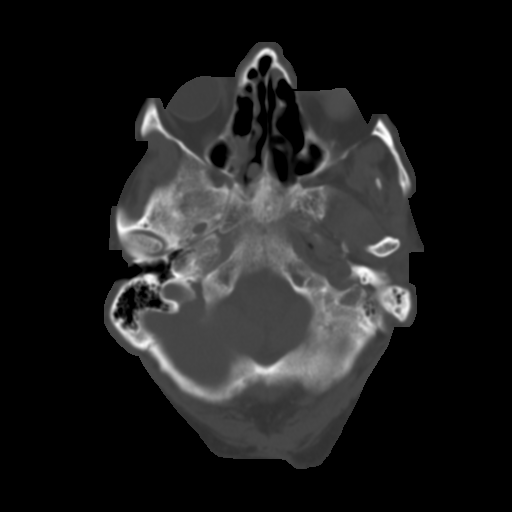
[im 8/31  brain]
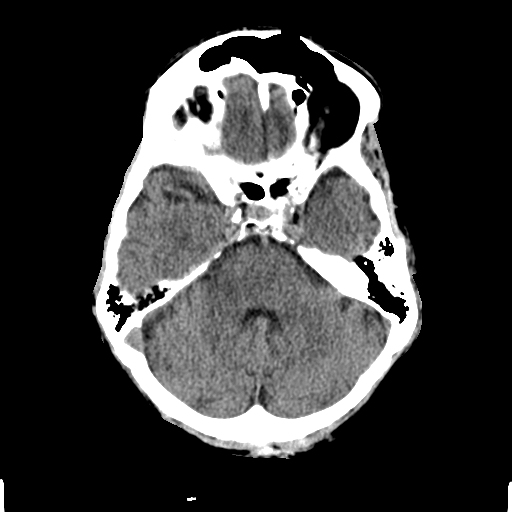
[im 12/31  brain]
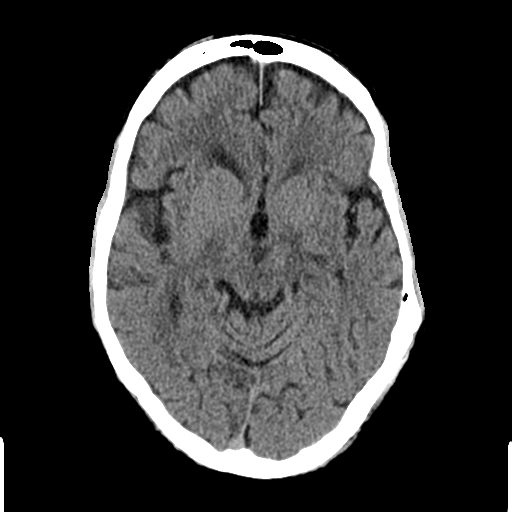
[im 16/31  brain]
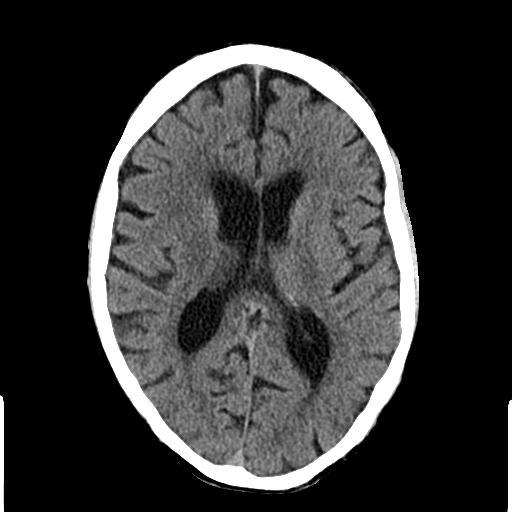
[im 19/31  brain]
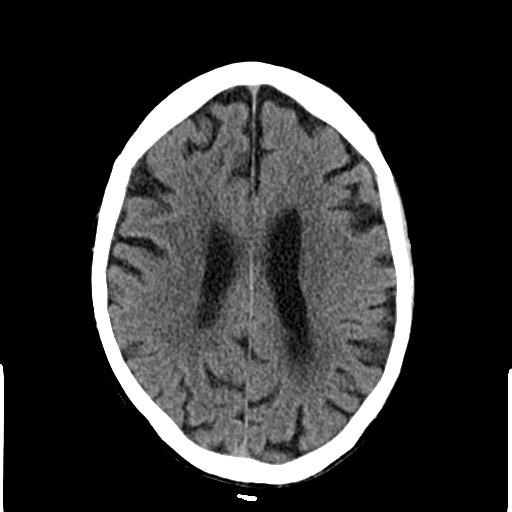
[im 19/31  bone]
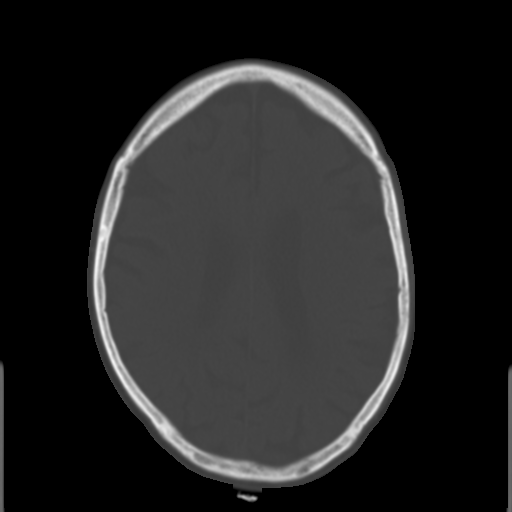
[im 23/31  brain]
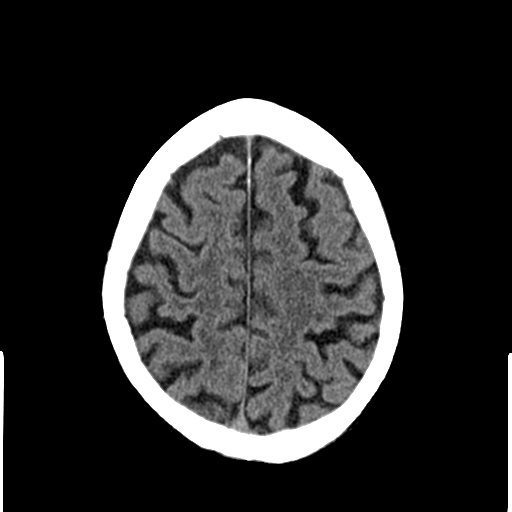
[im 27/31  brain]
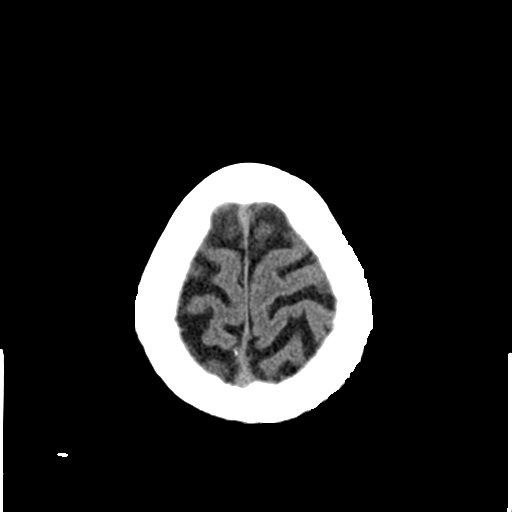

[Series 4: coronal soft tissue · coronal · 0.31mm/px · 3 of 72 slices shown]
[im 24/72  brain]
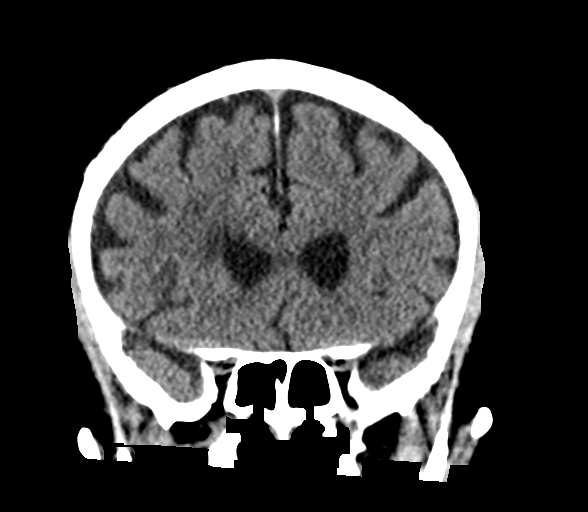
[im 32/72  brain]
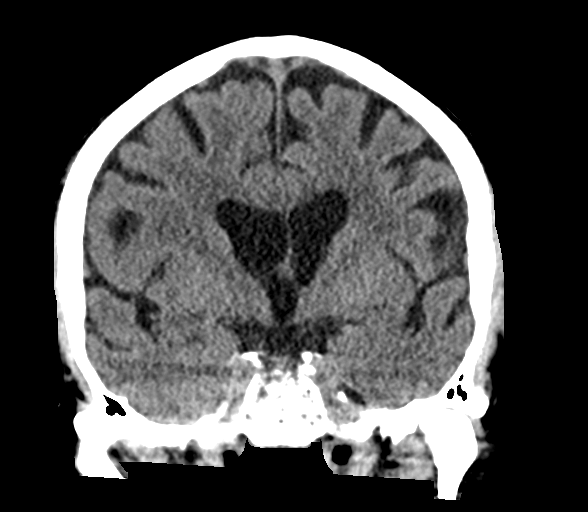
[im 40/72  brain]
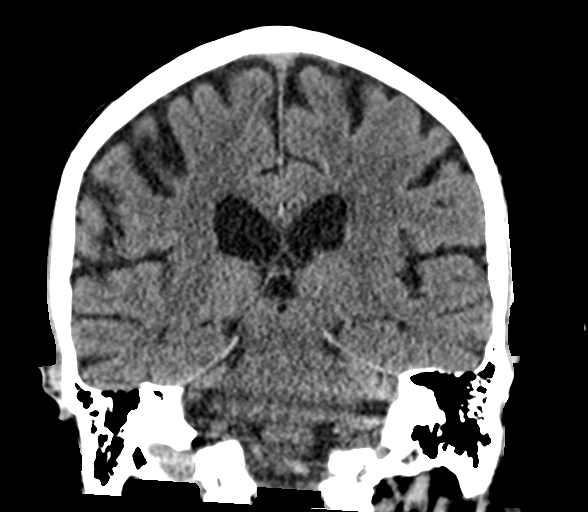

[Series 5: sagittal soft tissue · sagittal · 0.31mm/px · 3 of 62 slices shown]
[im 21/62  brain]
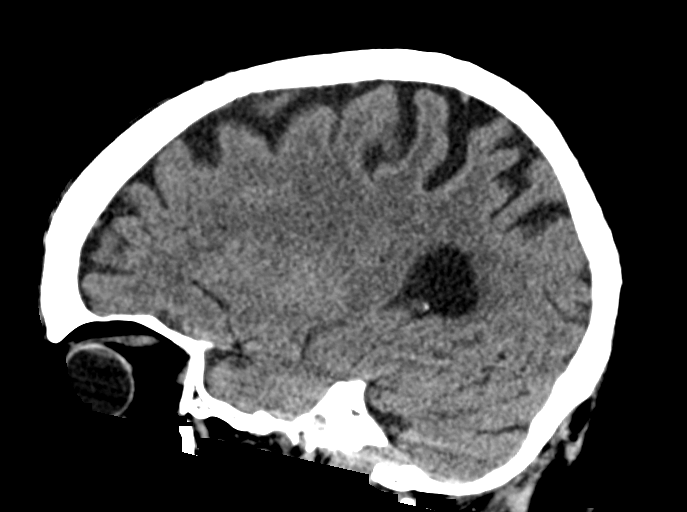
[im 31/62  brain]
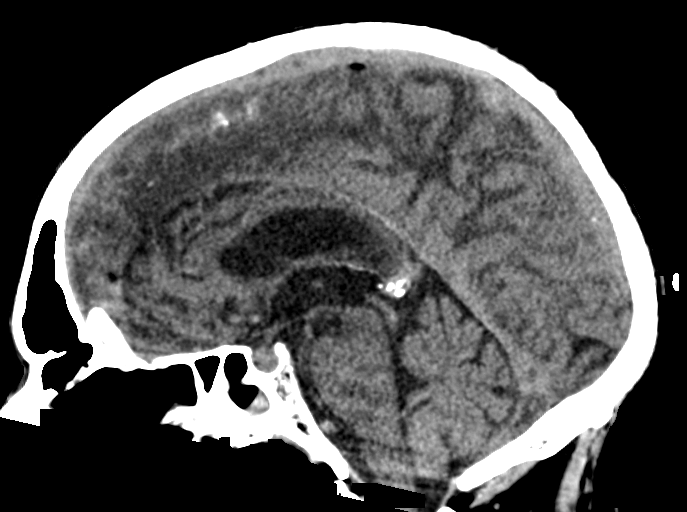
[im 41/62  brain]
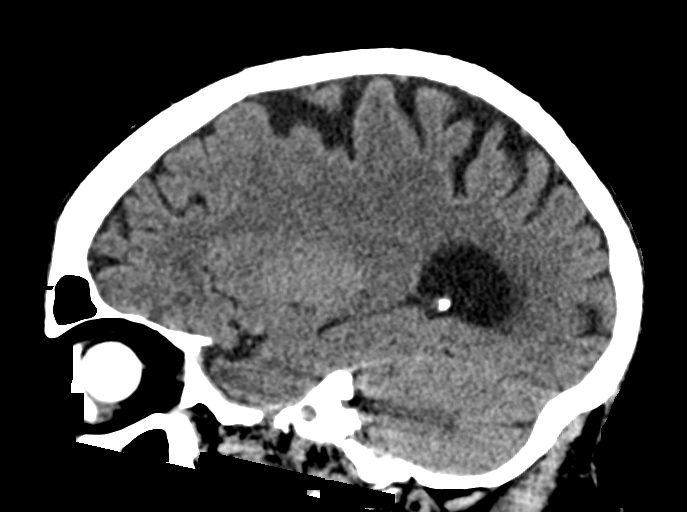

[16 of 47 positions shown; findings below may reference images not displayed]

FINDINGS: Brain: No acute intracranial findings are seen. Cortical sulci are
prominent. There is no shift of midline structures. There is no
focal edema or mass effect.

Vascular: There are scattered arterial calcifications.

Skull: Small osteoma is seen in the outer table of right frontal
calvarium close to midline.

Sinuses/Orbits: There is prosthetic left optic globe. There is
mucosal thickening in the ethmoid, sphenoid and maxillary sinuses.
Defects in the medial walls of maxillary sinuses may be
postsurgical.

Other: None
IMPRESSION: No acute intracranial findings are seen.  Atrophy.

Chronic sinusitis.

## 2024-04-24 LAB — BCR-ABL1, CML/ALL, PCR, QUANT
E1A2 Transcript: <0.0032 %
Interpretation (BCRAL):: NEGATIVE
b2a2 transcript: <0.0032 %
b3a2 transcript: <0.0032 %

## 2024-04-28 ENCOUNTER — Other Ambulatory Visit (HOSPITAL_COMMUNITY): Payer: Self-pay

## 2024-05-03 ENCOUNTER — Other Ambulatory Visit: Payer: Self-pay

## 2024-05-04 ENCOUNTER — Other Ambulatory Visit: Payer: Self-pay | Admitting: Pharmacy Technician

## 2024-05-04 ENCOUNTER — Other Ambulatory Visit: Payer: Self-pay

## 2024-05-04 NOTE — Progress Notes (Signed)
 Specialty Pharmacy Refill Coordination Note  Kevin Love is a 78 y.o. male contacted today regarding refills of specialty medication(s) Dasatinib  (SPRYCEL )  Spoke with Wife  Patient requested Delivery   Delivery date: 05/10/24   Verified address: [address] [place]   Medication will be filled on 05/09/24.

## 2024-05-09 ENCOUNTER — Other Ambulatory Visit

## 2024-05-23 ENCOUNTER — Ambulatory Visit: Admitting: Oncology

## 2024-06-01 ENCOUNTER — Other Ambulatory Visit: Payer: Self-pay | Admitting: Pharmacy Technician

## 2024-06-01 ENCOUNTER — Other Ambulatory Visit: Payer: Self-pay

## 2024-06-01 ENCOUNTER — Other Ambulatory Visit: Payer: Self-pay | Admitting: Nurse Practitioner

## 2024-06-01 DIAGNOSIS — C9210 Chronic myeloid leukemia, BCR/ABL-positive, not having achieved remission: Secondary | ICD-10-CM

## 2024-06-01 MED ORDER — DASATINIB 20 MG PO TABS
20.0000 mg | ORAL_TABLET | Freq: Every day | ORAL | 1 refills | Status: DC
  Filled 2024-06-01: qty 30, 30d supply, fill #0
  Filled 2024-07-04: qty 30, 30d supply, fill #1

## 2024-06-01 NOTE — Progress Notes (Signed)
 Specialty Pharmacy Refill Coordination Note  Kevin Love is a 78 y.o. male contacted today regarding refills of specialty medication(s) Dasatinib (SPRYCEL)  Spoke with wife  Patient requested Delivery   Delivery date: 06/08/24   Verified address: [address]  [place]   Medication will be filled on 06/07/24.   This fill date is pending response to refill request from provider. Patient is aware and if they have not received fill by intended date they must follow up with pharmacy.

## 2024-06-07 ENCOUNTER — Other Ambulatory Visit: Payer: Self-pay

## 2024-06-20 ENCOUNTER — Observation Stay: Admit: 2024-06-20

## 2024-06-20 ENCOUNTER — Emergency Department

## 2024-06-20 ENCOUNTER — Observation Stay
Admission: EM | Admit: 2024-06-20 | Discharge: 2024-06-21 | Disposition: A | Discharge status: Home / Self Care | Attending: Internal Medicine | Admitting: Internal Medicine

## 2024-06-20 ENCOUNTER — Observation Stay: Admit: 2024-06-20 | Discharge: 2024-06-20 | Disposition: A | Discharge status: Home / Self Care | Attending: Hospitalist

## 2024-06-20 ENCOUNTER — Encounter: Payer: Self-pay | Admitting: Emergency Medicine

## 2024-06-20 ENCOUNTER — Other Ambulatory Visit: Payer: Self-pay

## 2024-06-20 DIAGNOSIS — Z7982 Long term (current) use of aspirin: Secondary | ICD-10-CM | POA: Insufficient documentation

## 2024-06-20 DIAGNOSIS — Z79899 Other long term (current) drug therapy: Secondary | ICD-10-CM | POA: Insufficient documentation

## 2024-06-20 DIAGNOSIS — E785 Hyperlipidemia, unspecified: Secondary | ICD-10-CM | POA: Insufficient documentation

## 2024-06-20 DIAGNOSIS — I480 Paroxysmal atrial fibrillation: Secondary | ICD-10-CM | POA: Insufficient documentation

## 2024-06-20 DIAGNOSIS — C9210 Chronic myeloid leukemia, BCR/ABL-positive, not having achieved remission: Secondary | ICD-10-CM | POA: Diagnosis present

## 2024-06-20 DIAGNOSIS — Z8673 Personal history of transient ischemic attack (TIA), and cerebral infarction without residual deficits: Secondary | ICD-10-CM | POA: Insufficient documentation

## 2024-06-20 DIAGNOSIS — R0789 Other chest pain: Secondary | ICD-10-CM | POA: Diagnosis not present

## 2024-06-20 DIAGNOSIS — I259 Chronic ischemic heart disease, unspecified: Secondary | ICD-10-CM

## 2024-06-20 DIAGNOSIS — I509 Heart failure, unspecified: Secondary | ICD-10-CM | POA: Diagnosis not present

## 2024-06-20 DIAGNOSIS — I110 Hypertensive heart disease with heart failure: Secondary | ICD-10-CM | POA: Diagnosis not present

## 2024-06-20 DIAGNOSIS — G20A1 Parkinson's disease without dyskinesia, without mention of fluctuations: Secondary | ICD-10-CM | POA: Diagnosis present

## 2024-06-20 DIAGNOSIS — I4891 Unspecified atrial fibrillation: Secondary | ICD-10-CM | POA: Diagnosis present

## 2024-06-20 DIAGNOSIS — I639 Cerebral infarction, unspecified: Secondary | ICD-10-CM | POA: Diagnosis present

## 2024-06-20 DIAGNOSIS — R079 Chest pain, unspecified: Principal | ICD-10-CM | POA: Diagnosis present

## 2024-06-20 DIAGNOSIS — D649 Anemia, unspecified: Secondary | ICD-10-CM | POA: Insufficient documentation

## 2024-06-20 DIAGNOSIS — G20C Parkinsonism, unspecified: Secondary | ICD-10-CM | POA: Diagnosis not present

## 2024-06-20 DIAGNOSIS — G459 Transient cerebral ischemic attack, unspecified: Principal | ICD-10-CM | POA: Diagnosis present

## 2024-06-20 DIAGNOSIS — Z7901 Long term (current) use of anticoagulants: Secondary | ICD-10-CM | POA: Diagnosis not present

## 2024-06-20 DIAGNOSIS — I10 Essential (primary) hypertension: Secondary | ICD-10-CM | POA: Diagnosis present

## 2024-06-20 LAB — HEPATIC FUNCTION PANEL
ALT: <5 U/L (ref 0–44)
AST: 23 U/L (ref 15–41)
Albumin: 3.7 g/dL (ref 3.5–5.0)
Alkaline Phosphatase: 59 U/L (ref 38–126)
Bilirubin, Direct: <0.1 mg/dL (ref 0.0–0.2)
Total Bilirubin: 0.7 mg/dL (ref 0.0–1.2)
Total Protein: 6.4 g/dL — ABNORMAL LOW (ref 6.5–8.1)

## 2024-06-20 LAB — COMPREHENSIVE METABOLIC PANEL WITH GFR
ALT: <5 U/L (ref 0–44)
AST: 23 U/L (ref 15–41)
Albumin: 3.6 g/dL (ref 3.5–5.0)
Alkaline Phosphatase: 57 U/L (ref 38–126)
Anion gap: 8 (ref 5–15)
BUN: 18 mg/dL (ref 8–23)
CO2: 22 mmol/L (ref 22–32)
Calcium: 8.9 mg/dL (ref 8.9–10.3)
Chloride: 106 mmol/L (ref 98–111)
Creatinine, Ser: 0.69 mg/dL (ref 0.61–1.24)
GFR, Estimated: >60 mL/min (ref >60)
Glucose, Bld: 97 mg/dL (ref 70–99)
Potassium: 4.4 mmol/L (ref 3.5–5.1)
Sodium: 136 mmol/L (ref 135–145)
Total Bilirubin: 1 mg/dL (ref 0.0–1.2)
Total Protein: 6.3 g/dL — ABNORMAL LOW (ref 6.5–8.1)

## 2024-06-20 LAB — BASIC METABOLIC PANEL WITH GFR
Anion gap: 8 (ref 5–15)
BUN: 19 mg/dL (ref 8–23)
CO2: 25 mmol/L (ref 22–32)
Calcium: 8.9 mg/dL (ref 8.9–10.3)
Chloride: 105 mmol/L (ref 98–111)
Creatinine, Ser: 0.78 mg/dL (ref 0.61–1.24)
GFR, Estimated: >60 mL/min (ref >60)
Glucose, Bld: 134 mg/dL — ABNORMAL HIGH (ref 70–99)
Potassium: 4.6 mmol/L (ref 3.5–5.1)
Sodium: 138 mmol/L (ref 135–145)

## 2024-06-20 LAB — TROPONIN I (HIGH SENSITIVITY)
Troponin I (High Sensitivity): 31 ng/L — ABNORMAL HIGH (ref <18)
Troponin I (High Sensitivity): 37 ng/L — ABNORMAL HIGH (ref <18)
Troponin I (High Sensitivity): 27 ng/L — ABNORMAL HIGH (ref <18)

## 2024-06-20 LAB — CBC
HCT: 41.5 % (ref 39.0–52.0)
Hemoglobin: 14.1 g/dL (ref 13.0–17.0)
MCH: 32.6 pg (ref 26.0–34.0)
MCHC: 34 g/dL (ref 30.0–36.0)
MCV: 95.8 fL (ref 80.0–100.0)
Platelets: 177 K/uL (ref 150–400)
RBC: 4.33 MIL/uL (ref 4.22–5.81)
RDW: 13.2 % (ref 11.5–15.5)
WBC: 6.9 K/uL (ref 4.0–10.5)
nRBC: 0 % (ref 0.0–0.2)

## 2024-06-20 LAB — PROTIME-INR
INR: 1.2 (ref 0.8–1.2)
Prothrombin Time: 16 s — ABNORMAL HIGH (ref 11.4–15.2)

## 2024-06-20 LAB — LIPASE, BLOOD: Lipase: 38 U/L (ref 11–51)

## 2024-06-20 LAB — BRAIN NATRIURETIC PEPTIDE: B Natriuretic Peptide: 170 pg/mL — ABNORMAL HIGH (ref 0.0–100.0)

## 2024-06-20 MED ORDER — ASPIRIN 81 MG PO TBEC
81.0000 mg | DELAYED_RELEASE_TABLET | Freq: Every day | ORAL | Status: DC
Start: 2024-06-20 — End: 2024-06-21
  Administered 2024-06-21: 81 mg via ORAL
  Filled 2024-06-20 (×2): qty 1

## 2024-06-20 MED ORDER — APIXABAN 5 MG PO TABS
5.0000 mg | ORAL_TABLET | Freq: Two times a day (BID) | ORAL | Status: DC
  Administered 2024-06-20 – 2024-06-21 (×2): 5 mg via ORAL
  Filled 2024-06-20 (×2): qty 1

## 2024-06-20 MED ORDER — CLONAZEPAM 0.5 MG PO TABS
0.5000 mg | ORAL_TABLET | Freq: Every day | ORAL | Status: DC
  Administered 2024-06-20: 0.5 mg via ORAL
  Filled 2024-06-20: qty 1

## 2024-06-20 MED ORDER — ASPIRIN 81 MG PO CHEW
324.0000 mg | CHEWABLE_TABLET | Freq: Once | ORAL | Status: AC
  Administered 2024-06-20: 324 mg via ORAL
  Filled 2024-06-20: qty 4

## 2024-06-20 MED ORDER — ACETAMINOPHEN 325 MG PO TABS: 650.0000 mg | ORAL_TABLET | ORAL | Status: DC | PRN

## 2024-06-20 MED ORDER — ONDANSETRON HCL 4 MG/2ML IJ SOLN: 4.0000 mg | Freq: Four times a day (QID) | INTRAMUSCULAR | Status: DC | PRN

## 2024-06-20 MED ORDER — ATORVASTATIN CALCIUM 80 MG PO TABS
80.0000 mg | ORAL_TABLET | Freq: Every day | ORAL | Status: DC
  Administered 2024-06-20 – 2024-06-21 (×2): 80 mg via ORAL
  Filled 2024-06-20 (×2): qty 1

## 2024-06-20 MED ORDER — CARBIDOPA-LEVODOPA 25-100 MG PO TABS
1.0000 | ORAL_TABLET | Freq: Four times a day (QID) | ORAL | Status: DC
  Administered 2024-06-20 – 2024-06-21 (×4): 1 via ORAL
  Filled 2024-06-20 (×6): qty 1

## 2024-06-20 MED ORDER — ENOXAPARIN SODIUM 40 MG/0.4ML IJ SOSY
40.0000 mg | PREFILLED_SYRINGE | INTRAMUSCULAR | Status: DC
  Filled 2024-06-20: qty 0.4

## 2024-06-20 MED ORDER — LISINOPRIL 10 MG PO TABS
10.0000 mg | ORAL_TABLET | Freq: Every day | ORAL | Status: DC
Start: 2024-06-21 — End: 2024-06-21
  Administered 2024-06-21: 10 mg via ORAL
  Filled 2024-06-20: qty 1

## 2024-06-20 MED ORDER — HYDROCODONE-ACETAMINOPHEN 5-325 MG PO TABS: 1.0000 | ORAL_TABLET | Freq: Four times a day (QID) | ORAL | Status: DC | PRN

## 2024-06-20 MED ORDER — VITAMIN B-12 1000 MCG PO TABS
1000.0000 ug | ORAL_TABLET | Freq: Every day | ORAL | Status: DC
  Administered 2024-06-21: 1000 ug via ORAL
  Filled 2024-06-20: qty 1

## 2024-06-20 NOTE — H&P (Signed)
 History and Physical    Kevin Love FMW:[id number] DOB: November 01, 1945 DOA: 06/20/2024  DOS: the patient was seen and examined on 06/20/2024  PCP: Rudolpho Norleen BIRCH, MD   Patient coming from: Home  I have personally briefly reviewed patient's old medical records in [facility]  Chief Complaint: Chest pain  HPI: Kevin Love is a pleasant 78 y.o. male with medical history significant for CML, atrial fibrillation, CHF, HLD, HTN, A-fib on Eliquis , Parkinson's disease and history of previous stroke who came into ED complaining of chest pain.  Patient stated that he woke up around 1 AM in the morning with chest pain.  His chest pain was pressure-like 7/10 in intensity, nonradiating, not associated with nausea or vomiting.  He denies any fever, chills, cough, shortness of breath, palpitations, leg swelling.  ED Course: Upon arrival to the ED, patient is found to be in chest pain.  Vital signs were stable.  Troponin is 37 and 31.  EKG was without ST elevation.  Patient was given aspirin  324 mg and hospitalist service was consulted for evaluation for admission for ruling out acute coronary syndrome.  Review of Systems:  ROS  All other systems negative except as noted in the HPI.  Past Medical History:  Diagnosis Date   Anemia    Atrial fibrillation (HCC)    on Eliquis    CHF (congestive heart failure) (HCC)    Chronic Leukemia    Glaucoma    Hyperlipidemia    Hypertension    Parkinson disease (HCC) 07/25/2020   Stroke [facility])     Past Surgical History:  Procedure Laterality Date   BACK SURGERY     COLONOSCOPY WITH PROPOFOL  N/A 06/03/2022   Procedure: COLONOSCOPY WITH PROPOFOL ;  Surgeon: Maryruth Ole DASEN, MD;  Location: [facility] ENDOSCOPY;  Service: Endoscopy;  Laterality: N/A;   ESOPHAGOGASTRODUODENOSCOPY (EGD) WITH PROPOFOL  N/A 06/03/2022   Procedure: ESOPHAGOGASTRODUODENOSCOPY (EGD) WITH PROPOFOL ;  Surgeon: Maryruth Ole DASEN, MD;  Location: [facility] ENDOSCOPY;  Service: Endoscopy;   Laterality: N/A;   GIVENS CAPSULE STUDY N/A 06/04/2022   Procedure: GIVENS CAPSULE STUDY;  Surgeon: Maryruth Ole DASEN, MD;  Location: [facility] ENDOSCOPY;  Service: Endoscopy;  Laterality: N/A;   toe removed Right      reports that he has quit smoking. His smoking use included cigarettes. He has never used smokeless tobacco. He reports that he does not drink alcohol and does not use drugs.  Allergies  Allergen Reactions   Penicillins Rash    History reviewed. No pertinent family history.  Prior to Admission medications   Medication Sig Start Date End Date Taking? Authorizing Provider  acetaminophen  (TYLENOL ) 500 MG tablet Take 1,000 mg by mouth every 6 (six) hours as needed for moderate pain.    [provider]  apixaban  (ELIQUIS ) 5 MG TABS tablet Take 1 tablet (5 mg total) by mouth 2 (two) times daily. 02/13/22   Laurita Pillion, MD  aspirin  EC (BAYER ASPIRIN  EC LOW DOSE) 81 MG tablet Take 81 mg by mouth. 02/02/13   [provider]  atorvastatin  (LIPITOR ) 80 MG tablet Take 1 tablet (80 mg total) by mouth daily. 02/14/22   Laurita Pillion, MD  carbidopa -levodopa  (PARCOPA ) 25-100 MG disintegrating tablet Take 1 tablet by mouth 4 (four) times daily. 11/25/22   [provider]  cephALEXin (KEFLEX) 500 MG capsule Take 500 mg by mouth every 6 (six) hours. 12/29/23   [provider]  clonazePAM  (KLONOPIN ) 0.5 MG tablet Take 0.5 mg by mouth daily.    [provider]  cyanocobalamin  1000 MCG tablet Take 1,000 mcg by mouth daily.    [provider]  dasatinib  (SPRYCEL ) 20 MG tablet Take 1 tablet (20 mg total) by mouth daily. 06/01/24   Rao, Archana C, MD  doxycycline (VIBRAMYCIN) 100 MG capsule Take 100 mg by mouth 2 (two) times daily. 11/02/23   [provider]  furosemide  (LASIX ) 20 MG tablet Take 1 tablet (20 mg total) by mouth daily as needed (take daily in the morning as needed for ankle swelling). 05/12/22   Rogers Hai, MD   HYDROcodone -acetaminophen  (NORCO/VICODIN) 5-325 MG tablet Take 1 tablet by mouth every 6 (six) hours as needed for severe pain (pain score 7-10). 10/20/23 10/19/24  Cleaster Tinnie LABOR, PA-C  ketorolac (ACULAR) 0.4 % SOLN Apply to eye. 10/01/22   [provider]  latanoprost  (XALATAN ) 0.005 % ophthalmic solution Place 1 drop into the right eye at bedtime. 02/12/20   [provider]  lisinopril  (ZESTRIL ) 20 MG tablet Take 20 mg by mouth daily. 1/2 once a day 07/28/22   [provider]  memantine  (NAMENDA ) 10 MG tablet Take 10 mg by mouth 2 (two) times daily.    [provider]  Multiple Vitamin (MULTIVITAMIN WITH MINERALS) TABS tablet Take 1 tablet by mouth daily.    [provider]  predniSONE (DELTASONE) 5 MG tablet  07/30/22   [provider]  tamsulosin  (FLOMAX ) 0.4 MG CAPS capsule Take 0.4 mg by mouth daily.    [provider]  valACYclovir (VALTREX) 1000 MG tablet Take 1,000 mg by mouth 2 (two) times daily. 11/25/23   [provider]    Physical Exam: Vitals:   06/20/24 0928 06/20/24 0930 06/20/24 1000 06/20/24 1030  BP:  131/63 111/60 (!) 113/59  Pulse:  72 66 63  Resp:  18 20 19   Temp:  98.4 F (36.9 C)    TempSrc:  Oral    SpO2:  95% 98% 97%  Weight: 68 kg     Height: 5' 4 (1.626 m)       Physical Exam   Constitutional: Alert, awake, calm, comfortable HEENT: Neck supple Respiratory: Clear to auscultation B/L, no wheezing, no rales.  Cardiovascular: Regular rate and rhythm, no murmurs / rubs / gallops. No extremity edema. 2+ pedal pulses. No carotid bruits.  Abdomen: Soft, no tenderness, Bowel sounds positive.  Musculoskeletal: no clubbing / cyanosis. Good ROM, no contractures. Normal muscle tone.  Skin: no rashes, lesions, ulcers. Neurologic: CN 2-12 grossly intact. Sensation intact, No focal deficit identified Psychiatric: Alert and oriented x 3. Normal mood.    Labs on Admission: I have personally  reviewed following labs and imaging studies  CBC: Recent Labs  Lab 06/20/24 0940  WBC 6.9  HGB 14.1  HCT 41.5  MCV 95.8  PLT 177   Basic Metabolic Panel: Recent Labs  Lab 06/20/24 0940  NA 138  K 4.6  CL 105  CO2 25  GLUCOSE 134*  BUN 19  CREATININE 0.78  CALCIUM  8.9   GFR: Estimated Creatinine Clearance: 62.7 mL/min (by C-G formula based on SCr of 0.78 mg/dL). Liver Function Tests: Recent Labs  Lab 06/20/24 0940  AST 23  ALT <5  ALKPHOS 59  BILITOT 0.7  PROT 6.4*  ALBUMIN 3.7   Recent Labs  Lab 06/20/24 0940  LIPASE 38   No results for input(s): AMMONIA in the last 168 hours. Coagulation Profile: Recent Labs  Lab 06/20/24 0940  INR 1.2   Cardiac Enzymes: Recent Labs  Lab 06/20/24 0940 06/20/24 1119  TROPONINIHS 31* 37*   BNP (last 3 results) Recent Labs    06/20/24 0940  BNP 170.0*   HbA1C: No results for input(s): HGBA1C in the last 72 hours. CBG: No results for input(s): GLUCAP in the last 168 hours. Lipid Profile: No results for input(s): CHOL, HDL, LDLCALC, TRIG, CHOLHDL, LDLDIRECT in the last 72 hours. Thyroid  Function Tests: No results for input(s): TSH, T4TOTAL, FREET4, T3FREE, THYROIDAB in the last 72 hours. Anemia Panel: No results for input(s): VITAMINB12, FOLATE, FERRITIN, TIBC, IRON, RETICCTPCT in the last 72 hours. Urine analysis: No results found for: COLORURINE, APPEARANCEUR, LABSPEC, PHURINE, GLUCOSEU, HGBUR, BILIRUBINUR, KETONESUR, PROTEINUR, UROBILINOGEN, NITRITE, LEUKOCYTESUR  Radiological Exams on Admission: I have personally reviewed images DG Chest 2 View Result Date: 06/20/2024 CLINICAL DATA:  Acute right-sided chest pain.  Atrial fibrillation. EXAM: CHEST - 2 VIEW COMPARISON:  06/01/2022 FINDINGS: The heart size and mediastinal contours are within normal limits. Both lungs are clear. The visualized skeletal structures are unremarkable. IMPRESSION: No  active cardiopulmonary disease. Electronically Signed   By: Norleen DELENA Kil M.D.   On: 06/20/2024 10:21    EKG: My personal interpretation of EKG shows: Normal sinus rhythm at 69 bpm without ST elevation.    Assessment/Plan Principal Problem:   Chest pain Active Problems:   CVA (cerebral vascular accident) (HCC)   New onset atrial fibrillation (HCC)   Parkinson disease (HCC)   CML (chronic myelocytic leukemia) (HCC)   Hypertension   TIA (transient ischemic attack)    Assessment and Plan: 78 year old male known/PMH of atrial fibrillation on Eliquis , CVA, CML, HTN, history of TIA/stroke who was admitted today for chest pain.  1.  Chest pain - Patient will be placed in observation. - Patient troponins are slightly higher than baseline, EKG does not show any ST elevation, patient does not have chest pain at this point. - Will monitor him in telemetry, trend troponin, echocardiogram, cardiology evaluation. - Patient received aspirin  325 mg  - Continue aspirin  and statin  2.  Atrial fibrillation - Continue on Eliquis  - Heart rate is normal  3.  History of stroke - Supportive care - Continue aspirin , statin, Eliquis   4.  HTN - Stable - Continue lisinopril  10 mg daily. - Continue to monitor blood pressure  5.  Parkinson's disease - Is stable continue Parkinson's medication carbidopa  levodopa   6.  CML - Stable - Continue follow-up with oncologist as outpatient  7.  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  History of congestive heart failure - Patient used to take Lasix  but now he is taking it as needed - He has not been taking Lasix  for some time. - He does not appear to be fluid overloaded. - No need for standing Lasix  at this point     DVT prophylaxis: Eliquis  Code Status: Full Code Family Communication: Patient, his wife and granddaughter was at bedside.  Patient wants to be full code. Disposition Plan: Home versus rehab Consults called:  Orthopedics Admission status: Inpatient, Telemetry bed   Nena Rebel, MD [facility] 06/20/2024, 12:31 PM

## 2024-06-20 NOTE — ED Triage Notes (Signed)
 Patient to ED via POV for CP. States started earlier this AM on the right center. States it feels like someone is sitting on his chest. Hx CHF and afib- takes eliquis  for same. Denies pain at this time.

## 2024-06-20 NOTE — ED Provider Notes (Signed)
 [facility] Provider Note    Event Date/Time   First MD Initiated Contact with Patient 06/20/24 [id number]     (approximate)   History   Chest Pain   HPI  Garfield Coiner is a 78 y.o. 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  Patient reports having woken up around 1 AM with chest pain.  He reports it is a pressure sensation across his chest.  He denies any current pain now.  He states that it woke him up from sleep but I did go away.  He states that he told his wife about it and then she had him brought here for evaluation.  He does report history of A-fib on Eliquis  denies any falls hitting his head, shortness of breath or other concerns.  He denies any abdominal pain.   Physical Exam   Triage Vital Signs: ED Triage Vitals  Encounter Vitals Group     BP --      Girls Systolic BP Percentile --      Girls Diastolic BP Percentile --      Boys Systolic BP Percentile --      Boys Diastolic BP Percentile --      Pulse --      Resp --      Temp --      Temp src --      SpO2 --      Weight 06/20/24 0928 150 lb (68 kg)     Height 06/20/24 0928 5' 4 (1.626 m)     Head Circumference --      Peak Flow --      Pain Score 06/20/24 0927 0     Pain Loc --      Pain Education --      Exclude from Growth Chart --     Most recent vital signs: Vitals:   06/20/24 1000 06/20/24 1030  BP: 111/60 (!) 113/59  Pulse: 66 63  Resp: 20 19  Temp:    SpO2: 98% 97%     General: Awake, no distress.  CV:  Good peripheral perfusion.  Resp:  Normal effort.  Clear lungs no murmur Abd:  No distention.  Soft and nontender Other:  Equal radial pulses equal DP pulses.   ED Results / Procedures / Treatments   Labs (all labs ordered are listed, but only abnormal results are displayed) Labs Reviewed - No data to display   EKG  My interpretation of EKG:  Normal sinus rate of 69 without any ST elevation or T wave inversions.  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  His prior EKG did show right bundle branch block previously  RADIOLOGY I have reviewed the xray personally and interpreted no evidence of any pneumonia   PROCEDURES:  Critical Care performed: No  Procedures   MEDICATIONS ORDERED IN ED: Medications  aspirin  chewable tablet 324 mg (has no administration in time range)  acetaminophen  (TYLENOL ) tablet 650 mg (has no administration in time range)  ondansetron  (ZOFRAN ) injection 4 mg (has no administration in time range)  enoxaparin  (LOVENOX ) injection 40 mg (has no administration in time range)     IMPRESSION / MDM / ASSESSMENT AND PLAN / ED COURSE  I reviewed the triage vital signs and the nursing notes.   Patient's presentation is most consistent with acute presentation with potential threat to life or bodily function.   Patient comes in with  concerns for chest pain that woke him up from sleep.  He is in normal sinus rhythm.  Differential includes unstable angina versus ACS.  No evidence of dissection given resolution of pain.  Considered PE but already on Eliquis  so doubt that.  Blood work shows slightly elevated BNP but similar to prior but his BMP shows normal kidney function CBC shows no anemia.  However troponin is elevated at 31.  This is abnormal for patient as he never had an elevation before he also his story is concerning to me so we discussed admission for cardiac monitoring, chest pain rule out and he expressed understanding felt comfortable with this plan.  Patient was loaded with aspirin .  Holding on heparin at this time given patient is chest pain-free but will need to continue trending out troponins to see if heparin needs to be initiated  The patient is on the cardiac monitor to evaluate for evidence of arrhythmia and/or significant heart rate changes.      FINAL CLINICAL IMPRESSION(S) / ED DIAGNOSES   Final diagnoses:  Nonspecific chest pain     Rx / DC  Orders   ED Discharge Orders     None        Note:  This document was prepared using Dragon voice recognition software and may include unintentional dictation errors.   Ernest Ronal BRAVO, MD 06/20/24 [id number]

## 2024-06-20 NOTE — Consult Note (Signed)
 [facility] CLINIC CARDIOLOGY CONSULT NOTE       Patient ID: Kevin Love MRN: [id number] DOB/AGE: 12/26/1945 78 y.o.  Admit date: 06/20/2024 Referring Physician Dr. Roann Primary Physician Rudolpho Norleen BIRCH, MD Primary Cardiologist Dr. Ammon Reason for Consultation chest pain  HPI: Kevin Love is a 78 y.o. male  with a past medical history of paroxysmal atrial fibrillation (on Eliquis ), history of TIA (04/2022), history of GI bleed (05/2022), hypertension, hyperlipidemia, Parkinson's who presented to the ED on 06/20/2024 for 1x episode of chest pain/pressure not associated with N/V or SOB. Patient with no known hx of CAD or MI. Cardiology was consulted for further evaluation.   Work up in the ED notable for sodium 138, potassium 4.6, creatinine 0.78, hemoglobin 14.1, platelets 177, LFTs within normal limits.  Troponins minimally elevated and flat 31 > 37.  She with sinus rhythm, rate 57 bpm without acute ischemic changes.  BNP mildly elevated at 170.  CXR without acute cardiopulmonary disease.  Patient given 1X of aspirin  324 mg.  At the time of my evaluation this afternoon, patient was sitting comfortably in hospital bed.  We discussed patient's symptoms in further detail. Patient states yesterday morning at 1AM he had sudden chest pressure that woke him up from sleep. Patient states the pain/pressure lasted about 1-1.5 hours and has had no recurrence since. Patient states he walks with his walker with no exertional chest pain or SOB. Patient denies hx of CAD or MI. Patient denies alcohol or tobacco use. Patient states this is the first time he's ever had chest pressure like that. Patient states he's been ambulating to bathroom since admission with no concerns.    Review of systems complete and found to be negative unless listed above    Past Medical History:  Diagnosis Date   Anemia    Atrial fibrillation (HCC)    on Eliquis    CHF (congestive heart failure) (HCC)    Chronic Leukemia     Glaucoma    Hyperlipidemia    Hypertension    Parkinson disease (HCC) 07/25/2020   Stroke [facility])     Past Surgical History:  Procedure Laterality Date   BACK SURGERY     COLONOSCOPY WITH PROPOFOL  N/A 06/03/2022   Procedure: COLONOSCOPY WITH PROPOFOL ;  Surgeon: Maryruth Ole DASEN, MD;  Location: [facility] ENDOSCOPY;  Service: Endoscopy;  Laterality: N/A;   ESOPHAGOGASTRODUODENOSCOPY (EGD) WITH PROPOFOL  N/A 06/03/2022   Procedure: ESOPHAGOGASTRODUODENOSCOPY (EGD) WITH PROPOFOL ;  Surgeon: Maryruth Ole DASEN, MD;  Location: [facility] ENDOSCOPY;  Service: Endoscopy;  Laterality: N/A;   GIVENS CAPSULE STUDY N/A 06/04/2022   Procedure: GIVENS CAPSULE STUDY;  Surgeon: Maryruth Ole DASEN, MD;  Location: [facility] ENDOSCOPY;  Service: Endoscopy;  Laterality: N/A;   toe removed Right     (Not in a hospital admission)  Social History   Socioeconomic History   Marital status: Married    Spouse name: Not on file   Number of children: Not on file   Years of education: Not on file   Highest education level: Not on file  Occupational History   Not on file  Tobacco Use   Smoking status: Former    Types: Cigarettes   Smokeless tobacco: Never  Vaping Use   Vaping status: Never Used  Substance and Sexual Activity   Alcohol use: Never   Drug use: Never   Sexual activity: Not Currently  Other Topics Concern   Not on file  Social History Narrative   Not on file   Social Drivers of  Health   Financial Resource Strain: Low Risk  (08/04/2023)   Received from [facility]   Overall Financial Resource Strain (CARDIA)    Difficulty of Paying Living Expenses: Not hard at all  Food Insecurity: No Food Insecurity (08/04/2023)   Received from [facility]   Hunger Vital Sign    Within the past 12 months, you worried that your food would run out before you got the money to buy more.: Never true    Within the past 12 months, the food you bought just didn't last and you didn't  have money to get more.: Never true  Transportation Needs: No Transportation Needs (08/04/2023)   Received from [facility] - Transportation    In the past 12 months, has lack of transportation kept you from medical appointments or from getting medications?: No    Lack of Transportation (Non-Medical): No  Physical Activity: Insufficiently Active (08/22/2020)   Exercise Vital Sign    Days of Exercise per Week: 7 days    Minutes of Exercise per Session: 10 min  Stress: No Stress Concern Present (08/22/2020)   [employer] of Occupational Health - Occupational Stress Questionnaire    Feeling of Stress : Not at all  Social Connections: Moderately Integrated (08/22/2020)   Social Connection and Isolation Panel    Frequency of Communication with Friends and Family: Three times a week    Frequency of Social Gatherings with Friends and Family: Once a week    Attends Religious Services: More than 4 times per year    Active Member of [employer] or Organizations: No    Attends Banker Meetings: Never    Marital Status: Married  Catering manager Violence: Not At Risk (08/22/2020)   Humiliation, Afraid, Rape, and Kick questionnaire    Fear of Current or Ex-Partner: No    Emotionally Abused: No    Physically Abused: No    Sexually Abused: No    History reviewed. No pertinent family history.   Vitals:   06/20/24 1200 06/20/24 1230 06/20/24 1300 06/20/24 1337  BP: 128/66 127/61 (!) 111/59   Pulse: 63 62 62   Resp: (!) 23 (!) 22 13   Temp:    98.3 F (36.8 C)  TempSrc:    Oral  SpO2: 100% 100% 99%   Weight:      Height:        PHYSICAL EXAM General: Well appearing elerly male, well nourished, in no acute distress. HEENT: Normocephalic and atraumatic. Neck: No JVD.   Lungs: Normal respiratory effort on room air. Clear bilaterally to auscultation. No wheezes, crackles, rhonchi.  Heart: HRRR. Normal S1 and S2 without gallops or murmurs.  Abdomen:  Non-distended appearing.  Msk: Normal strength and tone for age. Extremities: Warm and well perfused. No clubbing, cyanosis, edema.  Neuro: Alert and oriented X 3. Psych: Answers questions appropriately.   Labs: Basic Metabolic Panel: Recent Labs    06/20/24 0940  NA 138  K 4.6  CL 105  CO2 25  GLUCOSE 134*  BUN 19  CREATININE 0.78  CALCIUM  8.9   Liver Function Tests: Recent Labs    06/20/24 0940  AST 23  ALT <5  ALKPHOS 59  BILITOT 0.7  PROT 6.4*  ALBUMIN 3.7   Recent Labs    06/20/24 0940  LIPASE 38   CBC: Recent Labs    06/20/24 0940  WBC 6.9  HGB 14.1  HCT 41.5  MCV 95.8  PLT 177   Cardiac Enzymes: Recent Labs    06/20/24 0940 06/20/24 1119  TROPONINIHS 31* 37*   BNP: Recent Labs    06/20/24 0940  BNP 170.0*   D-Dimer: No results for input(s): DDIMER in the last 72 hours. Hemoglobin A1C: No results for input(s): HGBA1C in the last 72 hours. Fasting Lipid Panel: No results for input(s): CHOL, HDL, LDLCALC, TRIG, CHOLHDL, LDLDIRECT in the last 72 hours. Thyroid  Function Tests: No results for input(s): TSH, T4TOTAL, T3FREE, THYROIDAB in the last 72 hours.  Invalid input(s): FREET3 Anemia Panel: No results for input(s): VITAMINB12, FOLATE, FERRITIN, TIBC, IRON, RETICCTPCT in the last 72 hours.   Radiology: DG Chest 2 View Result Date: 06/20/2024 CLINICAL DATA:  Acute right-sided chest pain.  Atrial fibrillation. EXAM: CHEST - 2 VIEW COMPARISON:  06/01/2022 FINDINGS: The heart size and mediastinal contours are within normal limits. Both lungs are clear. The visualized skeletal structures are unremarkable. IMPRESSION: No active cardiopulmonary disease. Electronically Signed   By: Norleen DELENA Kil M.D.   On: 06/20/2024 10:21    ECHO ordered.  TELEMETRY reviewed by me 06/20/2024: sinus rhythm, rate 70s  EKG reviewed by me: sinus rhythm, rate 57 bpm without acute ischemic changes.  Data reviewed by me  06/20/2024: last 24h vitals tele labs imaging I/O ED provider note, admission H&P.  Principal Problem:   Chest pain Active Problems:   Hypertension   Parkinson disease (HCC)   TIA (transient ischemic attack)   CML (chronic myelocytic leukemia) (HCC)   CVA (cerebral vascular accident) (HCC)   New onset atrial fibrillation (HCC)    ASSESSMENT AND PLAN:  Telford Archambeau is a 78 y.o. male  with a past medical history of paroxysmal atrial fibrillation (on Eliquis ), history of TIA (04/2022), history of GI bleed (05/2022), hypertension, hyperlipidemia, Parkinson's who presented to the ED on 06/20/2024 for 1x episode of chest pain/pressure not associated with N/V or SOB. Patient with no known hx of CAD or MI. Cardiology was consulted for further evaluation.   # Chest pain # Elevated troponin # Hypertension # Hyperlipidemia Troponins minimally elevated and flat 31 > 37, next level pending.  EKG with sinus rhythm, rate 57 bpm without acute ischemic changes. - Echo ordered.  Further recommendations pending results. - Lipid panel ordered. - Continue aspirin  81 mg, atorvastatin  80 mg daily. - Continue home lisinopril  10 mg daily. - Plan for cardiac stress test tomorrow. NPO at midnight. Patient is agreeable. Further recommendations pending results.   # Paroxysmal atrial fibrillation # History of TIA (2023) EKG and per tele in sinus rhythm, rate controlled.  - Continue home Eliquis  5 mg twice daily for stroke risk reduction.  This patient's plan of care was discussed and created with Dr. Florencio and he is in agreement.  Signed: Dorene Comfort, PA-C  06/20/2024, 1:58 PM [facility] Cardiology

## 2024-06-21 ENCOUNTER — Observation Stay

## 2024-06-21 DIAGNOSIS — R079 Chest pain, unspecified: Secondary | ICD-10-CM | POA: Diagnosis not present

## 2024-06-21 DIAGNOSIS — G20A1 Parkinson's disease without dyskinesia, without mention of fluctuations: Secondary | ICD-10-CM | POA: Diagnosis not present

## 2024-06-21 DIAGNOSIS — R0789 Other chest pain: Secondary | ICD-10-CM | POA: Diagnosis not present

## 2024-06-21 DIAGNOSIS — R627 Adult failure to thrive: Secondary | ICD-10-CM | POA: Insufficient documentation

## 2024-06-21 DIAGNOSIS — C9210 Chronic myeloid leukemia, BCR/ABL-positive, not having achieved remission: Secondary | ICD-10-CM | POA: Diagnosis not present

## 2024-06-21 DIAGNOSIS — I10 Essential (primary) hypertension: Secondary | ICD-10-CM

## 2024-06-21 LAB — ECHOCARDIOGRAM COMPLETE
AR max vel: 2.28 cm2
AV Area VTI: 2.58 cm2
AV Area mean vel: 2.18 cm2
AV Mean grad: 3 mmHg
AV Peak grad: 4.7 mmHg
Ao pk vel: 1.08 m/s
Area-P 1/2: 2.69 cm2
Calc EF: 63.7 %
Height: 64 in
MV VTI: 2.24 cm2
S' Lateral: 5.35 cm
Single Plane A2C EF: 69.7 %
Single Plane A4C EF: 52.7 %
Weight: 2400 [oz_av]

## 2024-06-21 LAB — NM MYOCAR MULTI W/SPECT W/WALL MOTION / EF
Base ST Depression (mm): 0 mm
Estimated workload: 1
Exercise duration (min): 1 min
Exercise duration (sec): 0 s
MPHR: 141 {beats}/min
Nuc Stress EF: 66 %
Peak HR: 173 {beats}/min
Percent HR: 122 %
Rest HR: 66 {beats}/min
Rest Nuclear Isotope Dose: 10.1 mCi
SDS: 3
SRS: 4
SSS: 5
ST Depression (mm): 0 mm
Stress Nuclear Isotope Dose: 30.2 mCi
TID: 0.93

## 2024-06-21 LAB — CBC
HCT: 37.5 % — ABNORMAL LOW (ref 39.0–52.0)
Hemoglobin: 12.9 g/dL — ABNORMAL LOW (ref 13.0–17.0)
MCH: 32.9 pg (ref 26.0–34.0)
MCHC: 34.4 g/dL (ref 30.0–36.0)
MCV: 95.7 fL (ref 80.0–100.0)
Platelets: 173 K/uL (ref 150–400)
RBC: 3.92 MIL/uL — ABNORMAL LOW (ref 4.22–5.81)
RDW: 13.4 % (ref 11.5–15.5)
WBC: 6.2 K/uL (ref 4.0–10.5)
nRBC: 0 % (ref 0.0–0.2)

## 2024-06-21 LAB — LIPID PANEL
Cholesterol: 86 mg/dL (ref 0–200)
HDL: 32 mg/dL — ABNORMAL LOW (ref >40)
LDL Cholesterol: 36 mg/dL (ref 0–99)
Total CHOL/HDL Ratio: 2.7 ratio
Triglycerides: 88 mg/dL (ref <150)
VLDL: 18 mg/dL (ref 0–40)

## 2024-06-21 MED ORDER — TECHNETIUM TC 99M TETROFOSMIN IV KIT
10.1000 | PACK | Freq: Once | INTRAVENOUS | Status: AC | PRN
  Administered 2024-06-21: 10.1 via INTRAVENOUS

## 2024-06-21 MED ORDER — REGADENOSON 0.4 MG/5ML IV SOLN
0.4000 mg | Freq: Once | INTRAVENOUS | Status: AC
  Administered 2024-06-21: 0.4 mg via INTRAVENOUS

## 2024-06-21 MED ORDER — TECHNETIUM TC 99M TETROFOSMIN IV KIT
30.2000 | PACK | Freq: Once | INTRAVENOUS | Status: AC | PRN
  Administered 2024-06-21: 30.2 via INTRAVENOUS

## 2024-06-21 NOTE — Hospital Course (Addendum)
 Partially taken from prior notes  Kevin Love is a pleasant 78 y.o. male with medical history significant for CML, atrial fibrillation, CHF, HLD, HTN, A-fib on Eliquis , Parkinson's disease and history of previous stroke who came into ED complaining of chest pain.   On presentation stable vitals, troponin 37>> 31. EKG was negative for any acute ST changes.  Patient was given aspirin  324 mg and was admitted under observation to rule out ACS.  9/3: Vital stable, lipid profile normal, BNP 170.  Echocardiogram with normal EF and grade 1 diastolic dysfunction.  No regional wall motion abnormalities.  Mildly dilated left atrium and mild MR.  Patient underwent stress testing was low risk.  Chest pain has been resolved.  Most likely musculoskeletal.  ACS ruled out.  Cardiology cleared him for discharge.  Patient will continue with his home medications and follow-up with his providers for further assistance.

## 2024-06-21 NOTE — Discharge Summary (Signed)
 Physician Discharge Summary   Patient: Kevin Love MRN: [id number] DOB: 1946-05-12  Admit date:     06/20/2024  Discharge date: 06/21/24  Discharge Physician: Amaryllis Dare   PCP: Rudolpho Norleen BIRCH, MD   Recommendations at discharge:  Please obtain CBC and BMP on follow-up Follow-up with primary care provider Follow-up with cardiology  Discharge Diagnoses: Principal Problem:   Chest pain Active Problems:   CVA (cerebral vascular accident) [facility])   New onset atrial fibrillation (HCC)   Parkinson disease (HCC)   CML (chronic myelocytic leukemia) (HCC)   Hypertension   TIA (transient ischemic attack)   Hospital Course: Partially taken from prior notes  Kevin Love is a pleasant 78 y.o. male with medical history significant for CML, atrial fibrillation, CHF, HLD, HTN, A-fib on Eliquis , Parkinson's disease and history of previous stroke who came into ED complaining of chest pain.   On presentation stable vitals, troponin 37>> 31. EKG was negative for any acute ST changes.  Patient was given aspirin  324 mg and was admitted under observation to rule out ACS.  9/3: Vital stable, lipid profile normal, BNP 170.  Echocardiogram with normal EF and grade 1 diastolic dysfunction.  No regional wall motion abnormalities.  Mildly dilated left atrium and mild MR.  Patient underwent stress testing was low risk.  Chest pain has been resolved.  Most likely musculoskeletal.  ACS ruled out.  Cardiology cleared him for discharge.  Patient will continue with his home medications and follow-up with his providers for further assistance.   Consultants: Cardiology Procedures performed: Myoview /stress test Disposition: Home Diet recommendation:  Cardiac diet DISCHARGE MEDICATION: Allergies as of 06/21/2024       Reactions   Penicillins Rash        Medication List     STOP taking these medications    Bayer Aspirin  EC Low Dose 81 MG tablet Generic drug: aspirin  EC   cephALEXin 500 MG  capsule Commonly known as: KEFLEX   doxycycline 100 MG capsule Commonly known as: VIBRAMYCIN   HYDROcodone -acetaminophen  5-325 MG tablet Commonly known as: NORCO/VICODIN   predniSONE 5 MG tablet Commonly known as: DELTASONE   valACYclovir 1000 MG tablet Commonly known as: VALTREX       TAKE these medications    acetaminophen  650 MG CR tablet Commonly known as: TYLENOL  Take 650 mg by mouth every 8 (eight) hours as needed for pain. What changed: Another medication with the same name was removed. Continue taking this medication, and follow the directions you see here.   apixaban  5 MG Tabs tablet Commonly known as: ELIQUIS  Take 1 tablet (5 mg total) by mouth 2 (two) times daily.   atorvastatin  80 MG tablet Commonly known as: LIPITOR  Take 1 tablet (80 mg total) by mouth daily.   carbidopa -levodopa  25-100 MG tablet Commonly known as: SINEMET  IR Take 1 tablet by mouth 4 (four) times daily.   clonazePAM  0.5 MG tablet Commonly known as: KLONOPIN  Take 0.5 mg by mouth daily.   cyanocobalamin  1000 MCG tablet Take 1,000 mcg by mouth daily.   dasatinib  20 MG tablet Commonly known as: Sprycel  Take 1 tablet (20 mg total) by mouth daily.   fluticasone 50 MCG/ACT nasal spray Commonly known as: FLONASE Place 2 sprays into both nostrils daily.   furosemide  20 MG tablet Commonly known as: LASIX  Take 1 tablet (20 mg total) by mouth daily as needed (take daily in the morning as needed for ankle swelling).   ketorolac 0.4 % Soln Commonly known as: ACULAR Place 1  drop into the right eye daily.   latanoprost  0.005 % ophthalmic solution Commonly known as: XALATAN  Place 1 drop into the right eye at bedtime.   lisinopril  20 MG tablet Commonly known as: ZESTRIL  Take 10 mg by mouth daily. 1/2 once a day   memantine  10 MG tablet Commonly known as: NAMENDA  Take 10 mg by mouth 2 (two) times daily.   multivitamin with minerals Tabs tablet Take 1 tablet by mouth daily.    tamsulosin  0.4 MG Caps capsule Commonly known as: FLOMAX  Take 0.4 mg by mouth daily.        Follow-up Information     Paraschos, Alexander, MD. Go in 1 week(s).   Specialty: Cardiology Contact information: [address] [facility] [place] [address] [phone]         Rudolpho Norleen BIRCH, MD. Schedule an appointment as soon as possible for a visit in 1 week(s).   Specialty: Internal Medicine Contact information: [address] [facility] [place] [address] [phone]                Discharge Exam: [name]   06/20/24 0928  Weight: 68 kg   General.  Frail elderly man, in no acute distress. Pulmonary.  Lungs clear bilaterally, normal respiratory effort. CV.  Regular rate and rhythm, no JVD, rub or murmur. Abdomen.  Soft, nontender, nondistended, BS positive. CNS.  Alert and oriented .  No focal neurologic deficit. Extremities.  No edema, no cyanosis, pulses intact and symmetrical.  Condition at discharge: stable  The results of significant diagnostics from this hospitalization (including imaging, microbiology, ancillary and laboratory) are listed below for reference.   Imaging Studies: NM Myocar Multi W/Spect W/Wall Motion / EF Result Date: 06/21/2024   Findings are consistent with no ischemia. The study is low risk.   No ST deviation was noted.   LV perfusion is normal. There is no evidence of ischemia. There is no evidence of infarction.   Left ventricular function is normal. 66% Nuclear stress EF: 66%. The left ventricular ejection fraction is hyperdynamic (>65%). End diastolic cavity size is normal. Normal Myocardial perfusion scan No evidence of stress inducted myocardial ischemia EF normal at 66% Normal wall motion This is a negative study Low risk scan   ECHOCARDIOGRAM COMPLETE Result Date: 06/21/2024    ECHOCARDIOGRAM REPORT   Patient Name:   Kevin Love Date of Exam: 06/20/2024 Medical Rec #:  [id number]     Height:       64.0 in Accession #:    [id number]   Weight:       150.0 lb Date of Birth:  [birth year]    BSA:          1.731 m Patient Age:    77 years     BP:           113/59 mmHg Patient Gender: M            HR:           60 bpm. Exam Location:  [facility] Procedure: 2D Echo, Cardiac Doppler and Color Doppler (Both Spectral and Color            Flow Doppler were utilized during procedure). Indications:     Chest Pain R07.9  History:         Patient has prior history of Echocardiogram examinations, most                  recent 06/02/2022. Signs/Symptoms:Chest Pain.  Sonographer:     Ashley McNeely-Sloane Referring Phys:  [id number] [facility] PAUDEL Diagnosing Phys: Dwayne D Callwood MD IMPRESSIONS  1. Left ventricular ejection fraction, by estimation, is 60 to 65%. The left ventricle has normal function. The left ventricle has no regional wall motion abnormalities. The left ventricular internal cavity size was mildly dilated. Left ventricular diastolic parameters are consistent with Grade I diastolic dysfunction (impaired relaxation).  2. Right ventricular systolic function is normal. The right ventricular size is normal.  3. Left atrial size was mildly dilated.  4. The mitral valve is normal in structure. Mild mitral valve regurgitation.  5. The aortic valve is normal in structure. Aortic valve regurgitation is not visualized. FINDINGS  Left Ventricle: Left ventricular ejection fraction, by estimation, is 60 to 65%. The left ventricle has normal function. The left ventricle has no regional wall motion abnormalities. Strain was performed and the global longitudinal strain is indeterminate. The left ventricular internal cavity size was mildly dilated. There is borderline concentric left ventricular hypertrophy. Left ventricular diastolic parameters are consistent with Grade I diastolic dysfunction (impaired relaxation). Right Ventricle: The right ventricular size is normal. No increase in right ventricular wall thickness. Right  ventricular systolic function is normal. Left Atrium: Left atrial size was mildly dilated. Right Atrium: Right atrial size was normal in size. Pericardium: There is no evidence of pericardial effusion. Mitral Valve: The mitral valve is normal in structure. Mild mitral valve regurgitation. MV peak gradient, 3.1 mmHg. The mean mitral valve gradient is 1.0 mmHg. Tricuspid Valve: The tricuspid valve is normal in structure. Tricuspid valve regurgitation is mild. Aortic Valve: The aortic valve is normal in structure. Aortic valve regurgitation is not visualized. Aortic valve mean gradient measures 3.0 mmHg. Aortic valve peak gradient measures 4.7 mmHg. Aortic valve area, by VTI measures 2.58 cm. Pulmonic Valve: The pulmonic valve was normal in structure. Pulmonic valve regurgitation is not visualized. Aorta: The ascending aorta was not well visualized. IAS/Shunts: No atrial level shunt detected by color flow Doppler. Additional Comments: 3D was performed not requiring image post processing on an independent workstation and was indeterminate.  LEFT VENTRICLE PLAX 2D LVIDd:         5.30 cm     Diastology LVIDs:         5.35 cm     LV e' medial:    7.07 cm/s LV PW:         1.20 cm     LV E/e' medial:  9.5 LV IVS:        1.10 cm     LV e' lateral:   7.51 cm/s LVOT diam:     2.15 cm     LV E/e' lateral: 8.9 LV SV:         60 LV SV Index:   35 LVOT Area:     3.63 cm  LV Volumes (MOD) LV vol d, MOD A2C: 83.3 ml LV vol d, MOD A4C: 84.1 ml LV vol s, MOD A2C: 25.2 ml LV vol s, MOD A4C: 39.8 ml LV SV MOD A2C:     58.1 ml LV SV MOD A4C:     84.1 ml LV SV MOD BP:      55.4 ml RIGHT VENTRICLE RV Basal diam:  4.30 cm RV Mid diam:    3.50 cm RV S prime:     10.30 cm/s TAPSE (M-mode): 2.4 cm LEFT ATRIUM             Index  RIGHT ATRIUM           Index LA diam:        4.30 cm 2.48 cm/m   RA Area:     14.20 cm LA Vol (A2C):   40.0 ml 23.11 ml/m  RA Volume:   28.40 ml  16.40 ml/m LA Vol (A4C):   25.4 ml 14.67 ml/m LA Biplane Vol:  34.7 ml 20.04 ml/m  AORTIC VALVE                    PULMONIC VALVE AV Area (Vmax):    2.28 cm     PV Vmax:        0.62 m/s AV Area (Vmean):   2.18 cm     PV Vmean:       42.500 cm/s AV Area (VTI):     2.58 cm     PV VTI:         0.159 m AV Vmax:           108.00 cm/s  PV Peak grad:   1.5 mmHg AV Vmean:          74.300 cm/s  PV Mean grad:   1.0 mmHg AV VTI:            0.234 m      RVOT Peak grad: 1 mmHg AV Peak Grad:      4.7 mmHg AV Mean Grad:      3.0 mmHg LVOT Vmax:         67.70 cm/s LVOT Vmean:        44.700 cm/s LVOT VTI:          0.166 m LVOT/AV VTI ratio: 0.71  AORTA Ao Root diam: 3.80 cm Ao Asc diam:  3.50 cm MITRAL VALVE MV Area (PHT): 2.69 cm    SHUNTS MV Area VTI:   2.24 cm    Systemic VTI:  0.17 m MV Peak grad:  3.1 mmHg    Systemic Diam: 2.15 cm MV Mean grad:  1.0 mmHg    Pulmonic VTI:  0.106 m MV Vmax:       0.88 m/s MV Vmean:      51.3 cm/s MV Decel Time: 282 msec MV E velocity: 66.90 cm/s MV A velocity: 81.70 cm/s MV E/A ratio:  0.82 Dwayne D Callwood MD Electronically signed by Cara JONETTA Lovelace MD Signature Date/Time: 06/21/2024/1:24:15 PM    Final    DG Chest 2 View Result Date: 06/20/2024 CLINICAL DATA:  Acute right-sided chest pain.  Atrial fibrillation. EXAM: CHEST - 2 VIEW COMPARISON:  06/01/2022 FINDINGS: The heart size and mediastinal contours are within normal limits. Both lungs are clear. The visualized skeletal structures are unremarkable. IMPRESSION: No active cardiopulmonary disease. Electronically Signed   By: Norleen DELENA Kil M.D.   On: 06/20/2024 10:21    Microbiology: Results for orders placed or performed during the hospital encounter of 02/12/22  Resp Panel by RT-PCR (Flu A&B, Covid) Nasopharyngeal Swab     Status: None   Collection Time: 02/12/22 11:18 AM   Specimen: Nasopharyngeal Swab; Nasopharyngeal(NP) swabs in vial transport medium  Result Value Ref Range Status   SARS Coronavirus 2 by RT PCR NEGATIVE NEGATIVE Final    Comment: (NOTE) SARS-CoV-2 target nucleic  acids are NOT DETECTED.  The SARS-CoV-2 RNA is generally detectable in upper respiratory specimens during the acute phase of infection. The lowest concentration of SARS-CoV-2 viral copies this assay can detect is 138 copies/mL. A negative result does  not preclude SARS-Cov-2 infection and should not be used as the sole basis for treatment or other patient management decisions. A negative result may occur with  improper specimen collection/handling, submission of specimen other than nasopharyngeal swab, presence of viral mutation(s) within the areas targeted by this assay, and inadequate number of viral copies(<138 copies/mL). A negative result must be combined with clinical observations, patient history, and epidemiological information. The expected result is Negative.  Fact Sheet for Patients:  BloggerCourse.com  Fact Sheet for Healthcare Providers:  SeriousBroker.it  This test is no t yet approved or cleared by the [place]  FDA and  has been authorized for detection and/or diagnosis of SARS-CoV-2 by FDA under an Emergency Use Authorization (EUA). This EUA will remain  in effect (meaning this test can be used) for the duration of the COVID-19 declaration under Section 564(b)(1) of the Act, 21 U.S.C.section 360bbb-3(b)(1), unless the authorization is terminated  or revoked sooner.       Influenza A by PCR NEGATIVE NEGATIVE Final   Influenza B by PCR NEGATIVE NEGATIVE Final    Comment: (NOTE) The Xpert Xpress SARS-CoV-2/FLU/RSV plus assay is intended as an aid in the diagnosis of influenza from Nasopharyngeal swab specimens and should not be used as a sole basis for treatment. Nasal washings and aspirates are unacceptable for Xpert Xpress SARS-CoV-2/FLU/RSV testing.  Fact Sheet for Patients: BloggerCourse.com  Fact Sheet for Healthcare Providers: SeriousBroker.it  This  test is not yet approved or cleared by the [place]  FDA and has been authorized for detection and/or diagnosis of SARS-CoV-2 by FDA under an Emergency Use Authorization (EUA). This EUA will remain in effect (meaning this test can be used) for the duration of the COVID-19 declaration under Section 564(b)(1) of the Act, 21 U.S.C. section 360bbb-3(b)(1), unless the authorization is terminated or revoked.  Performed at [facility], [address] [place], [place] [address]     Labs: CBC: Recent Labs  Lab 06/20/24 0940 06/21/24 0546  WBC 6.9 6.2  HGB 14.1 12.9*  HCT 41.5 37.5*  MCV 95.8 95.7  PLT 177 173   Basic Metabolic Panel: Recent Labs  Lab 06/20/24 0940 06/20/24 1602  NA 138 136  K 4.6 4.4  CL 105 106  CO2 25 22  GLUCOSE 134* 97  BUN 19 18  CREATININE 0.78 0.69  CALCIUM  8.9 8.9   Liver Function Tests: Recent Labs  Lab 06/20/24 0940 06/20/24 1602  AST 23 23  ALT <5 <5  ALKPHOS 59 57  BILITOT 0.7 1.0  PROT 6.4* 6.3*  ALBUMIN 3.7 3.6   CBG: No results for input(s): GLUCAP in the last 168 hours.  Discharge time spent: greater than 30 minutes.  This record has been created using Conservation officer, historic buildings. Errors have been sought and corrected,but may not always be located. Such creation errors do not reflect on the standard of care.   Signed: Amaryllis Dare, MD [facility] 06/21/2024

## 2024-06-21 NOTE — TOC CM/SW Note (Signed)
 Transition of Care [facility]) - Inpatient Brief Assessment   Patient Details  Name: Kevin Love MRN: [id number] Date of Birth: 1946/03/25  [facility]) CM/SW Contact:    Lauraine JAYSON Carpen, LCSW Phone Number: 06/21/2024, 4:37 PM   Clinical Narrative: Patient has orders to discharge home today. Chart reviewed. No TOC needs identified. CSW signing off.  Transition of Care Asessment: Insurance and Status: Insurance coverage has been reviewed Patient has primary care physician: Yes Home environment has been reviewed: Apartment Prior level of function:: Not documented Prior/Current Home Services: No current home services Social Drivers of Health Review: SDOH reviewed no interventions necessary Readmission risk has been reviewed: Yes Transition of care needs: no transition of care needs at this time

## 2024-06-21 NOTE — Progress Notes (Signed)
 Patient and wife given discharge instructions and information that per Dr. Florencio, patient does not have ischemia or infarct on NM imaging. Patient and wife expressed understanding. PIV removed, transported to car for discharge by this nurse.

## 2024-06-21 NOTE — Care Management Obs Status (Signed)
 MEDICARE OBSERVATION STATUS NOTIFICATION   Patient Details  Name: Kevin Love MRN: [id number] Date of Birth: [birth year]   Medicare Observation Status Notification Given:  Patient is still not on the floor, he's in Nuclear Medicine.     [name] 06/21/2024, 1:23 PM

## 2024-06-21 NOTE — Progress Notes (Signed)
 [facility] CLINIC CARDIOLOGY PROGRESS NOTE       Patient ID: Kevin Love MRN: [id number] DOB/AGE: 80-Mar-1947 80 y.o.  Admit date: 06/20/2024 Referring Physician Dr. Roann Primary Physician Rudolpho Norleen BIRCH, MD Primary Cardiologist Dr. Ammon Reason for Consultation chest pain  HPI: Kevin Love is a 78 y.o. male  with a past medical history of paroxysmal atrial fibrillation (on Eliquis ), history of TIA (04/2022), history of GI bleed (05/2022), hypertension, hyperlipidemia, Parkinson's who presented to the ED on 06/20/2024 for 1x episode of chest pain/pressure not associated with N/V or SOB. Patient with no known hx of CAD or MI. Cardiology was consulted for further evaluation.   Interval History: -Patient seen and examined this AM and laying comfortably in hospital bed. Patient states he feels well this AM and no more active chest pain.  -Patients BP and HR stable this AM. Overnight Tele showed no significant events.  -Patient remains on room air  with stable SpO2.  -Plan for cardiac stress test this afternoon.  Patient has remained NPO.    Review of systems complete and found to be negative unless listed above    Past Medical History:  Diagnosis Date   Anemia    Atrial fibrillation (HCC)    on Eliquis    CHF (congestive heart failure) (HCC)    Chronic Leukemia    Glaucoma    Hyperlipidemia    Hypertension    Parkinson disease (HCC) 07/25/2020   Stroke [facility])     Past Surgical History:  Procedure Laterality Date   BACK SURGERY     COLONOSCOPY WITH PROPOFOL  N/A 06/03/2022   Procedure: COLONOSCOPY WITH PROPOFOL ;  Surgeon: Maryruth Ole DASEN, MD;  Location: [facility] ENDOSCOPY;  Service: Endoscopy;  Laterality: N/A;   ESOPHAGOGASTRODUODENOSCOPY (EGD) WITH PROPOFOL  N/A 06/03/2022   Procedure: ESOPHAGOGASTRODUODENOSCOPY (EGD) WITH PROPOFOL ;  Surgeon: Maryruth Ole DASEN, MD;  Location: [facility] ENDOSCOPY;  Service: Endoscopy;  Laterality: N/A;   GIVENS CAPSULE STUDY N/A 06/04/2022    Procedure: GIVENS CAPSULE STUDY;  Surgeon: Maryruth Ole DASEN, MD;  Location: [facility] ENDOSCOPY;  Service: Endoscopy;  Laterality: N/A;   toe removed Right     Medications Prior to Admission  Medication Sig Dispense Refill Last Dose/Taking   acetaminophen  (TYLENOL ) 500 MG tablet Take 1,000 mg by mouth every 6 (six) hours as needed for moderate pain.   Unknown   acetaminophen  (TYLENOL ) 650 MG CR tablet Take 650 mg by mouth every 8 (eight) hours as needed for pain.   06/20/2024 at  8:00 AM   apixaban  (ELIQUIS ) 5 MG TABS tablet Take 1 tablet (5 mg total) by mouth 2 (two) times daily. 60 tablet 0 06/20/2024 at  8:30 AM   atorvastatin  (LIPITOR ) 80 MG tablet Take 1 tablet (80 mg total) by mouth daily. 30 tablet 0 06/19/2024 Noon   carbidopa -levodopa  (SINEMET  IR) 25-100 MG tablet Take 1 tablet by mouth 4 (four) times daily.   06/20/2024 at  8:00 AM   clonazePAM  (KLONOPIN ) 0.5 MG tablet Take 0.5 mg by mouth daily.   06/19/2024 Bedtime   cyanocobalamin  1000 MCG tablet Take 1,000 mcg by mouth daily.   06/20/2024   dasatinib  (SPRYCEL ) 20 MG tablet Take 1 tablet (20 mg total) by mouth daily. 30 tablet 1 06/19/2024 Bedtime   fluticasone (FLONASE) 50 MCG/ACT nasal spray Place 2 sprays into both nostrils daily.   Past Week   furosemide  (LASIX ) 20 MG tablet Take 1 tablet (20 mg total) by mouth daily as needed (take daily in the morning as needed for  ankle swelling). 30 tablet 1 Unknown   ketorolac (ACULAR) 0.4 % SOLN Place 1 drop into the right eye daily.   06/20/2024   latanoprost  (XALATAN ) 0.005 % ophthalmic solution Place 1 drop into the right eye at bedtime.   06/19/2024   lisinopril  (ZESTRIL ) 20 MG tablet Take 10 mg by mouth daily. 1/2 once a day   06/20/2024 at  8:00 AM   memantine  (NAMENDA ) 10 MG tablet Take 10 mg by mouth 2 (two) times daily.   06/20/2024 at  8:00 AM   Multiple Vitamin (MULTIVITAMIN WITH MINERALS) TABS tablet Take 1 tablet by mouth daily.   06/20/2024 at  8:00 AM   tamsulosin  (FLOMAX ) 0.4 MG CAPS capsule Take 0.4  mg by mouth daily.   06/19/2024 Noon   aspirin  EC (BAYER ASPIRIN  EC LOW DOSE) 81 MG tablet Take 81 mg by mouth. (Patient not taking: Reported on 06/20/2024)   Not Taking   cephALEXin (KEFLEX) 500 MG capsule Take 500 mg by mouth every 6 (six) hours. (Patient not taking: Reported on 06/20/2024)   Not Taking   doxycycline (VIBRAMYCIN) 100 MG capsule Take 100 mg by mouth 2 (two) times daily. (Patient not taking: Reported on 06/20/2024)   Not Taking   HYDROcodone -acetaminophen  (NORCO/VICODIN) 5-325 MG tablet Take 1 tablet by mouth every 6 (six) hours as needed for severe pain (pain score 7-10). (Patient not taking: Reported on 06/20/2024) 20 tablet 0 Not Taking   predniSONE (DELTASONE) 5 MG tablet  (Patient not taking: Reported on 06/20/2024)   Not Taking   valACYclovir (VALTREX) 1000 MG tablet Take 1,000 mg by mouth 2 (two) times daily. (Patient not taking: Reported on 06/20/2024)   Not Taking   Social History   Socioeconomic History   Marital status: Married    Spouse name: Not on file   Number of children: Not on file   Years of education: Not on file   Highest education level: Not on file  Occupational History   Not on file  Tobacco Use   Smoking status: Former    Types: Cigarettes   Smokeless tobacco: Never  Vaping Use   Vaping status: Never Used  Substance and Sexual Activity   Alcohol use: Never   Drug use: Never   Sexual activity: Not Currently  Other Topics Concern   Not on file  Social History Narrative   Not on file   Social Drivers of Health   Financial Resource Strain: Low Risk  (08/04/2023)   Received from [facility]   Overall Financial Resource Strain (CARDIA)    Difficulty of Paying Living Expenses: Not hard at all  Food Insecurity: No Food Insecurity (06/20/2024)   Hunger Vital Sign    Worried About Running Out of Food in the Last Year: Never true    Ran Out of Food in the Last Year: Never true  Transportation Needs: No Transportation Needs (06/20/2024)    PRAPARE - Administrator, Civil Service (Medical): No    Lack of Transportation (Non-Medical): No  Physical Activity: Insufficiently Active (08/22/2020)   Exercise Vital Sign    Days of Exercise per Week: 7 days    Minutes of Exercise per Session: 10 min  Stress: No Stress Concern Present (08/22/2020)   [employer] of Occupational Health - Occupational Stress Questionnaire    Feeling of Stress : Not at all  Social Connections: Socially Integrated (06/20/2024)   Social Connection and Isolation Panel    Frequency of Communication with Friends  and Family: More than three times a week    Frequency of Social Gatherings with Friends and Family: Once a week    Attends Religious Services: More than 4 times per year    Active Member of [employer] or Organizations: Yes    Attends Banker Meetings: More than 4 times per year    Marital Status: Married  Catering manager Violence: Not At Risk (06/20/2024)   Humiliation, Afraid, Rape, and Kick questionnaire    Fear of Current or Ex-Partner: No    Emotionally Abused: No    Physically Abused: No    Sexually Abused: No    History reviewed. No pertinent family history.   Vitals:   06/20/24 2016 06/20/24 2339 06/21/24 0353 06/21/24 0718  BP: (!) 128/54 (!) 142/61 (!) 130/45 (!) 149/57  Pulse: 65 66 60 (!) 58  Resp: 19 20 19 18   Temp: 97.6 F (36.4 C) 97.8 F (36.6 C) 97.6 F (36.4 C) 98.6 F (37 C)  TempSrc:      SpO2: 98% 97% 94% 100%  Weight:      Height:        PHYSICAL EXAM General: Well appearing elerly male, well nourished, in no acute distress. HEENT: Normocephalic and atraumatic. Neck: No JVD.   Lungs: Normal respiratory effort on room air. Clear bilaterally to auscultation. No wheezes, crackles, rhonchi.  Heart: HRRR. Normal S1 and S2 without gallops or murmurs.  Abdomen: Non-distended appearing.  Msk: Normal strength and tone for age. Extremities: Warm and well perfused. No clubbing, cyanosis, edema.   Neuro: Alert and oriented X 3. Psych: Answers questions appropriately.   Labs: Basic Metabolic Panel: Recent Labs    06/20/24 0940 06/20/24 1602  NA 138 136  K 4.6 4.4  CL 105 106  CO2 25 22  GLUCOSE 134* 97  BUN 19 18  CREATININE 0.78 0.69  CALCIUM  8.9 8.9   Liver Function Tests: Recent Labs    06/20/24 0940 06/20/24 1602  AST 23 23  ALT <5 <5  ALKPHOS 59 57  BILITOT 0.7 1.0  PROT 6.4* 6.3*  ALBUMIN 3.7 3.6   Recent Labs    06/20/24 0940  LIPASE 38   CBC: Recent Labs    06/20/24 0940 06/21/24 0546  WBC 6.9 6.2  HGB 14.1 12.9*  HCT 41.5 37.5*  MCV 95.8 95.7  PLT 177 173   Cardiac Enzymes: Recent Labs    06/20/24 0940 06/20/24 1119 06/20/24 1602  TROPONINIHS 31* 37* 27*   BNP: Recent Labs    06/20/24 0940  BNP 170.0*   D-Dimer: No results for input(s): DDIMER in the last 72 hours. Hemoglobin A1C: No results for input(s): HGBA1C in the last 72 hours. Fasting Lipid Panel: Recent Labs    06/21/24 0546  CHOL 86  HDL 32*  LDLCALC 36  TRIG 88  CHOLHDL 2.7   Thyroid  Function Tests: No results for input(s): TSH, T4TOTAL, T3FREE, THYROIDAB in the last 72 hours.  Invalid input(s): FREET3 Anemia Panel: No results for input(s): VITAMINB12, FOLATE, FERRITIN, TIBC, IRON, RETICCTPCT in the last 72 hours.   Radiology: DG Chest 2 View Result Date: 06/20/2024 CLINICAL DATA:  Acute right-sided chest pain.  Atrial fibrillation. EXAM: CHEST - 2 VIEW COMPARISON:  06/01/2022 FINDINGS: The heart size and mediastinal contours are within normal limits. Both lungs are clear. The visualized skeletal structures are unremarkable. IMPRESSION: No active cardiopulmonary disease. Electronically Signed   By: Norleen DELENA Kil M.D.   On: 06/20/2024 10:21  ECHO pending.  TELEMETRY reviewed by me 06/21/2024: sinus rhythm, rate 70s  EKG reviewed by me: sinus rhythm, rate 57 bpm without acute ischemic changes.  Data reviewed by me 06/21/2024: last  24h vitals tele labs imaging I/O hospitalist progress notes.  Principal Problem:   Chest pain Active Problems:   Hypertension   Parkinson disease (HCC)   TIA (transient ischemic attack)   CML (chronic myelocytic leukemia) (HCC)   CVA (cerebral vascular accident) (HCC)   New onset atrial fibrillation (HCC)    ASSESSMENT AND PLAN:  Kevin Love is a 77 y.o. male  with a past medical history of paroxysmal atrial fibrillation (on Eliquis ), history of TIA (04/2022), history of GI bleed (05/2022), hypertension, hyperlipidemia, Parkinson's who presented to the ED on 06/20/2024 for 1x episode of chest pain/pressure not associated with N/V or SOB. Patient with no known hx of CAD or MI. Cardiology was consulted for further evaluation.   # Chest pain # Elevated troponin # Hypertension # Hyperlipidemia Troponins minimally elevated and flat 31 > 37, next level pending.  EKG with sinus rhythm, rate 57 bpm without acute ischemic changes.  Panel within normal limits, LDL 36. - Echo ordered.  Further recommendations pending results. - Continue aspirin  81 mg, atorvastatin  80 mg daily. - Continue home lisinopril  10 mg daily. - Plan for cardiac stress test today. Patient is agreeable. Further recommendations pending results.   # Paroxysmal atrial fibrillation # History of TIA (2023) EKG and per tele in sinus rhythm, rate controlled.  -Continue home Eliquis  5 mg twice daily for stroke risk reduction.  This patient's plan of care was discussed and created with Dr. Florencio and he is in agreement.  Signed: Dorene Comfort, PA-C  06/21/2024, 11:41 AM [facility] Cardiology

## 2024-06-21 NOTE — Care Management Obs Status (Signed)
 MEDICARE OBSERVATION STATUS NOTIFICATION   Patient Details  Name: Kevin Love MRN: [id number] Date of Birth: 08-30-1946   Medicare Observation Status Notification Given:  Yes    [name] 06/21/2024, 4:02 PM

## 2024-06-21 NOTE — Care Management Obs Status (Signed)
 MEDICARE OBSERVATION STATUS NOTIFICATION   Patient Details  Name: [name] MRN: [id number] Date of Birth: 1946-08-13   Medicare Observation Status Notification Given:   Patient not on floor gone  To nuclear medicine     [name] 06/21/2024, 10:59 AM

## 2024-07-04 ENCOUNTER — Other Ambulatory Visit: Payer: Self-pay

## 2024-07-04 ENCOUNTER — Other Ambulatory Visit (HOSPITAL_COMMUNITY): Payer: Self-pay

## 2024-07-04 NOTE — Progress Notes (Signed)
 Specialty Pharmacy Refill Coordination Note  Spoke with [name]  Kevin Love is a 78 y.o. male contacted today regarding refills of specialty medication(s) Dasatinib  (SPRYCEL )  Doses on hand: 15  Patient requested: Delivery   Delivery date: 07/13/24   Verified address: [address] [place] [address]  Medication will be filled on 07/12/24.

## 2024-07-12 ENCOUNTER — Other Ambulatory Visit: Payer: Self-pay

## 2024-07-19 ENCOUNTER — Inpatient Hospital Stay: Attending: Internal Medicine

## 2024-07-19 DIAGNOSIS — Z79899 Other long term (current) drug therapy: Secondary | ICD-10-CM | POA: Diagnosis not present

## 2024-07-19 DIAGNOSIS — C9210 Chronic myeloid leukemia, BCR/ABL-positive, not having achieved remission: Secondary | ICD-10-CM | POA: Insufficient documentation

## 2024-07-19 DIAGNOSIS — Z87891 Personal history of nicotine dependence: Secondary | ICD-10-CM | POA: Diagnosis not present

## 2024-07-19 LAB — CBC WITH DIFFERENTIAL (CANCER CENTER ONLY)
Abs Immature Granulocytes: 0.03 K/uL (ref 0.00–0.07)
Basophils Absolute: 0.1 K/uL (ref 0.0–0.1)
Basophils Relative: 1 %
Eosinophils Absolute: 0.2 K/uL (ref 0.0–0.5)
Eosinophils Relative: 3 %
HCT: 36.6 % — ABNORMAL LOW (ref 39.0–52.0)
Hemoglobin: 12.4 g/dL — ABNORMAL LOW (ref 13.0–17.0)
Immature Granulocytes: 1 %
Lymphocytes Relative: 24 %
Lymphs Abs: 1.4 K/uL (ref 0.7–4.0)
MCH: 32.8 pg (ref 26.0–34.0)
MCHC: 33.9 g/dL (ref 30.0–36.0)
MCV: 96.8 fL (ref 80.0–100.0)
Monocytes Absolute: 0.5 K/uL (ref 0.1–1.0)
Monocytes Relative: 8 %
Neutro Abs: 3.7 K/uL (ref 1.7–7.7)
Neutrophils Relative %: 63 %
Platelet Count: 184 K/uL (ref 150–400)
RBC: 3.78 MIL/uL — ABNORMAL LOW (ref 4.22–5.81)
RDW: 13.7 % (ref 11.5–15.5)
WBC Count: 5.9 K/uL (ref 4.0–10.5)
nRBC: 0 % (ref 0.0–0.2)

## 2024-07-19 LAB — CMP (CANCER CENTER ONLY)
ALT: 11 U/L (ref 0–44)
AST: 25 U/L (ref 15–41)
Albumin: 3.6 g/dL (ref 3.5–5.0)
Alkaline Phosphatase: 71 U/L (ref 38–126)
Anion gap: 5 (ref 5–15)
BUN: 13 mg/dL (ref 8–23)
CO2: 23 mmol/L (ref 22–32)
Calcium: 8.9 mg/dL (ref 8.9–10.3)
Chloride: 109 mmol/L (ref 98–111)
Creatinine: 0.82 mg/dL (ref 0.61–1.24)
GFR, Estimated: >60 mL/min (ref >60)
Glucose, Bld: 155 mg/dL — ABNORMAL HIGH (ref 70–99)
Potassium: 4 mmol/L (ref 3.5–5.1)
Sodium: 137 mmol/L (ref 135–145)
Total Bilirubin: 0.9 mg/dL (ref 0.0–1.2)
Total Protein: 6 g/dL — ABNORMAL LOW (ref 6.5–8.1)

## 2024-07-23 LAB — BCR-ABL1, CML/ALL, PCR, QUANT
E1A2 Transcript: <0.0032 %
b2a2 transcript: <0.0032 %
b3a2 transcript: 0.0248 %

## 2024-07-25 ENCOUNTER — Telehealth: Payer: Self-pay | Admitting: Oncology

## 2024-07-25 NOTE — Telephone Encounter (Signed)
 Pt and pt spouse called and left vm requesting a call back with appt info - called back and gave necessary info - LH

## 2024-07-27 ENCOUNTER — Other Ambulatory Visit: Payer: Self-pay

## 2024-08-02 ENCOUNTER — Inpatient Hospital Stay: Admitting: Oncology

## 2024-08-02 ENCOUNTER — Other Ambulatory Visit: Payer: Self-pay

## 2024-08-02 ENCOUNTER — Encounter: Payer: Self-pay | Admitting: Oncology

## 2024-08-02 VITALS — BP 126/58 | HR 66 | Temp 96.6°F | Resp 20 | Ht 64.0 in | Wt 152.3 lb

## 2024-08-02 DIAGNOSIS — Z79899 Other long term (current) drug therapy: Secondary | ICD-10-CM

## 2024-08-02 DIAGNOSIS — C9210 Chronic myeloid leukemia, BCR/ABL-positive, not having achieved remission: Secondary | ICD-10-CM

## 2024-08-02 NOTE — Progress Notes (Signed)
 Patient doing well & does not have any new or acute concerns at this time.

## 2024-08-02 NOTE — Progress Notes (Signed)
 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  CML was diagnosed in October 2021 when he was seen by Dr. Rogers for evaluation of leukocytosis.  Bone marrow biopsy showed an 9:22 translocation and hypercellular bone marrow with morphologic features consistent with CML.  He was started on Dasatinib  70 mg in October 2021.  He had issues with fluid retention in November 2021 and subsequently Dasatinib  dose was reduced to 20 mg and he has been on the same dose since then.  He uses Lasix  as needed for his leg swelling.  BCR-ABL transcripts have remained detectable but not trending up.He gets Dasatinib  through compassionate care.  He is on memantine  for his Alzheimer's   Interval history- [name] is an 78 year old male with chronic myeloid leukemia who presents for follow-up.  He was recently hospitalized due to severe chest pain described as 'pressure' and 'something sitting on his chest,' accompanied by shortness of breath. He was kept for observation overnight, and tests revealed no significant findings. No significant leg swelling has been experienced.  There is a concern about weight loss, although he reportedly eats well and often. His caregiver notes that he is losing a little weight, and he is monitoring this closely.  ECOG PS- 2 Pain scale- 0   Review of systems- Review of  Systems  Constitutional:  Positive for malaise/fatigue. Negative for chills, fever and weight loss.  HENT:  Negative for congestion, ear discharge and nosebleeds.   Eyes:  Negative for blurred vision.  Respiratory:  Negative for cough, hemoptysis, sputum production, shortness of breath and wheezing.   Cardiovascular:  Negative for chest pain, palpitations, orthopnea and claudication.  Gastrointestinal:  Negative for abdominal pain, blood in stool, constipation, diarrhea, heartburn, melena, nausea and vomiting.  Genitourinary:  Negative for dysuria, flank pain, frequency, hematuria and urgency.  Musculoskeletal:  Negative for back pain, joint pain and myalgias.  Skin:  Negative for rash.  Neurological:  Negative for dizziness, tingling, focal weakness, seizures, weakness and headaches.  Endo/Heme/Allergies:  Does not bruise/bleed easily.  Psychiatric/Behavioral:  Negative for depression and suicidal ideas. The patient does not have insomnia.       Allergies  Allergen Reactions   Penicillins Rash     Past Medical History:  Diagnosis Date   Anemia    Atrial fibrillation (HCC)    on Eliquis    CHF (congestive heart failure) (HCC)    Chronic Leukemia    Glaucoma    Hyperlipidemia    Hypertension    Parkinson disease (HCC) 07/25/2020   Stroke [facility])      Past Surgical History:  Procedure Laterality Date   BACK SURGERY     COLONOSCOPY WITH PROPOFOL  N/A 06/03/2022   Procedure: COLONOSCOPY WITH PROPOFOL ;  Surgeon: Maryruth Ole DASEN, MD;  Location: [facility] ENDOSCOPY;  Service: Endoscopy;  Laterality: N/A;   ESOPHAGOGASTRODUODENOSCOPY (EGD) WITH PROPOFOL  N/A 06/03/2022   Procedure: ESOPHAGOGASTRODUODENOSCOPY (EGD) WITH PROPOFOL ;  Surgeon: Maryruth Ole DASEN, MD;  Location: [facility] ENDOSCOPY;  Service: Endoscopy;  Laterality: N/A;   GIVENS CAPSULE STUDY N/A 06/04/2022   Procedure: GIVENS CAPSULE STUDY;  Surgeon: Maryruth Ole DASEN, MD;  Location: [facility] ENDOSCOPY;  Service: Endoscopy;   Laterality: N/A;   toe removed Right     Social History   Socioeconomic History   Marital status: Married    Spouse name: Not on file   Number of children: Not on file   Years of education: Not on file   Highest education level: Not on file  Occupational History   Not on file  Tobacco Use   Smoking status: Former    Types: Cigarettes   Smokeless tobacco: Never  Vaping Use   Vaping status: Never Used  Substance and Sexual Activity   Alcohol use: Never   Drug use: Never   Sexual activity: Not Currently  Other Topics Concern   Not on file  Social History Narrative   Not on file   Social Drivers of Health   Financial Resource Strain: Low Risk  (07/20/2024)   Received from [facility]   Overall Financial Resource Strain (CARDIA)    Difficulty of Paying Living Expenses: Not very hard  Food Insecurity: No Food Insecurity (07/20/2024)   Received from [facility]   Hunger Vital Sign    Within the past 12 months, you worried that your food would run out before you got the money to buy more.: Never true    Within the past 12 months, the food you bought just didn't last and you didn't have money to get more.: Never true  Transportation Needs: No Transportation Needs (07/20/2024)   Received from [facility] - Transportation    In the past 12 months, has lack of transportation kept you from medical appointments or from getting medications?: No    Lack of Transportation (Non-Medical): No  Physical Activity: Insufficiently Active (08/22/2020)   Exercise Vital Sign    Days of Exercise per Week: 7 days    Minutes of Exercise per Session: 10 min  Stress: No Stress Concern Present (08/22/2020)   [employer] of Occupational Health - Occupational Stress Questionnaire    Feeling of Stress : Not at all  Social Connections: Socially Integrated (06/20/2024)   Social Connection and Isolation Panel    Frequency of Communication  with Friends and Family: More than three times a week    Frequency of Social Gatherings with Friends and Family: Once a week    Attends Religious Services: More than 4 times per year    Active Member of [employer] or Organizations: Yes    Attends Banker Meetings: More than 4 times per year    Marital Status: Married  Catering manager Violence: Not At Risk (06/20/2024)   Humiliation, Afraid, Rape, and Kick questionnaire    Fear of Current or Ex-Partner: No    Emotionally Abused: No    Physically Abused: No    Sexually Abused: No    History reviewed. No pertinent family history.   Current Outpatient Medications:    acetaminophen  (TYLENOL ) 650 MG CR tablet, Take 650 mg by mouth every 8 (eight) hours as needed for pain., Disp: , Rfl:    apixaban  (ELIQUIS ) 5 MG TABS tablet, Take 1 tablet (5 mg total) by mouth 2 (two) times daily., Disp: 60 tablet, Rfl: 0   atorvastatin  (LIPITOR ) 80 MG tablet, Take 1 tablet (80 mg total) by mouth daily., Disp: 30 tablet, Rfl:  0   azelastine (ASTELIN) 0.1 % nasal spray, Place 1 spray into the nose., Disp: , Rfl:    carbidopa -levodopa  (SINEMET  IR) 25-100 MG tablet, Take 1 tablet by mouth 4 (four) times daily., Disp: , Rfl:    clonazePAM  (KLONOPIN ) 0.5 MG tablet, Take 0.5 mg by mouth daily., Disp: , Rfl:    cyanocobalamin  1000 MCG tablet, Take 1,000 mcg by mouth daily., Disp: , Rfl:    fluticasone (FLONASE) 50 MCG/ACT nasal spray, Place 2 sprays into both nostrils daily., Disp: , Rfl:    furosemide  (LASIX ) 20 MG tablet, Take 1 tablet (20 mg total) by mouth daily as needed (take daily in the morning as needed for ankle swelling)., Disp: 30 tablet, Rfl: 1   ketorolac (ACULAR) 0.4 % SOLN, Place 1 drop into the right eye daily., Disp: , Rfl:    latanoprost  (XALATAN ) 0.005 % ophthalmic solution, Place 1 drop into the right eye at bedtime., Disp: , Rfl:    lisinopril  (ZESTRIL ) 20 MG tablet, Take 10 mg by mouth daily. 1/2 once a day, Disp: , Rfl:    memantine   (NAMENDA ) 10 MG tablet, Take 10 mg by mouth 2 (two) times daily., Disp: , Rfl:    Multiple Vitamin (MULTIVITAMIN WITH MINERALS) TABS tablet, Take 1 tablet by mouth daily., Disp: , Rfl:    tamsulosin  (FLOMAX ) 0.4 MG CAPS capsule, Take 0.4 mg by mouth daily., Disp: , Rfl:    dasatinib  (SPRYCEL ) 20 MG tablet, Take 1 tablet (20 mg total) by mouth daily., Disp: 30 tablet, Rfl: 1  Physical exam:  Vitals:   08/02/24 0956 08/02/24 1007  BP:  (!) 126/58  Pulse:  66  Resp:  20  Temp:  (!) 96.6 F (35.9 C)  TempSrc:  Tympanic  SpO2:  97%  Weight:  152 lb 4.8 oz (69.1 kg)  Height: 5' 4 (1.626 m) 5' 4 (1.626 m)   Physical Exam Cardiovascular:     Rate and Rhythm: Normal rate and regular rhythm.     Heart sounds: Normal heart sounds.  Pulmonary:     Effort: Pulmonary effort is normal.     Breath sounds: Normal breath sounds.  Skin:    General: Skin is warm and dry.  Neurological:     Mental Status: He is alert and oriented to person, place, and time.      I have personally reviewed labs listed below:    Latest Ref Rng & Units 07/19/2024   10:26 AM  CMP  Glucose 70 - 99 mg/dL 844   BUN 8 - 23 mg/dL 13   Creatinine 9.38 - 1.24 mg/dL 9.17   Sodium 864 - 854 mmol/L 137   Potassium 3.5 - 5.1 mmol/L 4.0   Chloride 98 - 111 mmol/L 109   CO2 22 - 32 mmol/L 23   Calcium  8.9 - 10.3 mg/dL 8.9   Total Protein 6.5 - 8.1 g/dL 6.0   Total Bilirubin 0.0 - 1.2 mg/dL 0.9   Alkaline Phos 38 - 126 U/L 71   AST 15 - 41 U/L 25   ALT 0 - 44 U/L 11       Latest Ref Rng & Units 07/19/2024   10:27 AM  CBC  WBC 4.0 - 10.5 K/uL 5.9   Hemoglobin 13.0 - 17.0 g/dL 87.5   Hematocrit 60.9 - 52.0 % 36.6   Platelets 150 - 400 K/uL 184      Assessment and plan- Patient is a 78 y.o. male with history of chronic phase  CML on Dasatinib  here for a routine follow-up  Patient is presently tolerating Dasatinib  well without any significant side Effects.  BCR-ABL P210 fusion transcript has been fluctuating from  being undetectable to be present at very low levels.  Most recent B3 A2 transcript was 0.024.  As long as it remains less than 1% he will continue with the same drug and dosing.  CBC with differential CMP BCR-ABL PCR testing in 3 and 6 months and I will see him back in 6 months   Visit Diagnosis 1. CML (chronic myeloid leukemia) (HCC)   2. High risk medication use      Dr. Annah Skene, MD, MPH [facility] [phone] 08/02/2024 12:30 PM

## 2024-08-04 ENCOUNTER — Other Ambulatory Visit: Payer: Self-pay

## 2024-08-04 ENCOUNTER — Other Ambulatory Visit: Payer: Self-pay | Admitting: Oncology

## 2024-08-04 DIAGNOSIS — C9210 Chronic myeloid leukemia, BCR/ABL-positive, not having achieved remission: Secondary | ICD-10-CM

## 2024-08-04 NOTE — Progress Notes (Signed)
 Specialty Pharmacy Refill Coordination Note  Kevin Love is a 78 y.o. male contacted today regarding refills of specialty medication(s) Dasatinib  (SPRYCEL )   Patient requested Delivery   Delivery date: 08/09/24   Verified address: [address] [place] [address]   Medication will be filled on 08/08/24. This fill date is pending response to refill request from provider. Patient is aware and if they have not received fill by intended date they must follow up with pharmacy.

## 2024-08-04 NOTE — Progress Notes (Signed)
 Specialty Pharmacy Ongoing Clinical Assessment Note  Clemmie Marxen is a 78 y.o. male who is being followed by the specialty pharmacy service for RxSp Oncology   Patient's specialty medication(s) reviewed today: Dasatinib  (SPRYCEL )   Missed doses in the last 4 weeks: 0   Patient/Caregiver did not have any additional questions or concerns.   Therapeutic benefit summary: Patient is achieving benefit   Adverse events/side effects summary: No adverse events/side effects   Patient's therapy is appropriate to: Continue    Goals Addressed             This Visit's Progress    Achieve or maintain remission   On track    Patient is on track. Patient will maintain adherence. Per OV on 10/15, BCR-ABL has fluctuated between being undetectable to being present at very low levels, currently as 0.024. Plan to keep patient on same medication and dosing unless it goes above 1%         Follow up: 6 months  [facility]

## 2024-08-07 ENCOUNTER — Encounter: Payer: Self-pay | Admitting: Oncology

## 2024-08-07 ENCOUNTER — Other Ambulatory Visit: Payer: Self-pay

## 2024-08-07 MED ORDER — DASATINIB 20 MG PO TABS
20.0000 mg | ORAL_TABLET | Freq: Every day | ORAL | 1 refills | Status: DC
  Filled 2024-08-07: qty 30, 30d supply, fill #0
  Filled 2024-09-05: qty 30, 30d supply, fill #1

## 2024-08-08 ENCOUNTER — Other Ambulatory Visit: Payer: Self-pay

## 2024-08-18 NOTE — Progress Notes (Signed)
 Discharge Plan Member has been discharged from [facility]. Please see below Discharge Plan for details. The Member has been provided a copy of their Discharge Plan via the [facility] Care portal. If you have any questions, please contact our Clinical Team at [phone]. Name Kevin Love Date of Birth Aug 29, 1946 Discharge Date: 2024-08-18 Discharged Reason Goals Met: Member-determined Discharging to: Referring Specialist Referring Provider: Dr. Clista Current Psychiatric Medications N/A (Note: CC Medication Recommendation made 01/04/24. Member declined psychiatric medications and proceeded with psychosocial support and interventions) Psychiatric Medication Recommendation Transition N/A (no CC med recs made) Referrals: No referrals Discharge Summary  Care Delivered: Member attended multiple [facility] visits over 5 months. Member improved from initial evaluation in March 2025 to August 2025 PHQ-9: 13 -> 2 Current status: Reason for discharge: Member requested; he no longer requires the additional support. Member is working on activities/relationships aligned with his values. Should referring organization contact?: Unknown if Member needs/is interested in additional support Final Intake Summary Kevin Love is a 78 y/o white male with CML (chronic myelocytic leukemia) since 07/2020 and other medical issues including Parkinson's Disease & stroke, referred by Dr. Clista @ [facility] for depression. Kevin Love is cautiously open to psychiatric medication recommendations. Assessments: Kevin Love scored PHQ-9=13 (moderate), reporting anhedonia, depressed mood, fatigue, feeling he let his family down, slower speech, and passive suicidal ideation with CSSR-S=Low-Risk Kevin Love scored GAD-7=1 (minimal), reporting fearful concern around relationship strain with his children. Kevin Love reported tendency to feel fidgety and finds significant relief in cleaning, particularly doing  laundry. Psychosocial Hx: Kevin Love lives with spouse Kevin Love. He has two children from his first marriage who live 3 hours away in [place], and a stepdaughter. Kevin Love attends church, though he hasn't connected with the male fellowship as well in [place] as he did in [place]. Assigned Dx: Q56.78: Adjustment disorder with depressed mood Kevin Love Care Provider: Kedric Love - Health Coach

## 2024-09-05 ENCOUNTER — Other Ambulatory Visit (HOSPITAL_COMMUNITY): Payer: Self-pay

## 2024-09-07 ENCOUNTER — Other Ambulatory Visit (HOSPITAL_COMMUNITY): Payer: Self-pay

## 2024-09-07 ENCOUNTER — Other Ambulatory Visit: Payer: Self-pay

## 2024-09-07 NOTE — Progress Notes (Signed)
 Specialty Pharmacy Refill Coordination Note  Kevin Love is a 78 y.o. male contacted today regarding refills of specialty medication(s) Dasatinib  (SPRYCEL )   Patient requested Delivery   Delivery date: 09/19/24   Verified address: [address] [place] [address]   Medication will be filled on: 09/18/24

## 2024-09-18 ENCOUNTER — Other Ambulatory Visit: Payer: Self-pay

## 2024-10-11 ENCOUNTER — Other Ambulatory Visit: Payer: Self-pay

## 2024-10-11 ENCOUNTER — Other Ambulatory Visit: Payer: Self-pay | Admitting: Oncology

## 2024-10-11 DIAGNOSIS — C9210 Chronic myeloid leukemia, BCR/ABL-positive, not having achieved remission: Secondary | ICD-10-CM

## 2024-10-11 MED ORDER — DASATINIB 20 MG PO TABS
20.0000 mg | ORAL_TABLET | Freq: Every day | ORAL | 1 refills | Status: AC
  Filled 2024-10-11 (×2): qty 30, 30d supply, fill #0
  Filled 2024-11-10: qty 30, 30d supply, fill #1

## 2024-10-16 ENCOUNTER — Other Ambulatory Visit (HOSPITAL_COMMUNITY): Payer: Self-pay

## 2024-10-16 ENCOUNTER — Other Ambulatory Visit: Payer: Self-pay

## 2024-10-16 ENCOUNTER — Encounter: Payer: Self-pay | Admitting: *Deleted

## 2024-10-16 ENCOUNTER — Encounter: Payer: Self-pay | Admitting: Oncology

## 2024-10-16 NOTE — Progress Notes (Signed)
 Specialty Pharmacy Refill Coordination Note  Kevin Love is a 78 y.o. male contacted today regarding refills of specialty medication(s) Dasatinib  (SPRYCEL )   Patient requested Delivery   Delivery date: 10/20/24   Verified address: [address] [place] [address]   Medication will be filled on: 10/18/24

## 2024-10-18 ENCOUNTER — Other Ambulatory Visit: Payer: Self-pay

## 2024-11-01 ENCOUNTER — Other Ambulatory Visit: Payer: Self-pay

## 2024-11-01 DIAGNOSIS — C9210 Chronic myeloid leukemia, BCR/ABL-positive, not having achieved remission: Secondary | ICD-10-CM

## 2024-11-02 ENCOUNTER — Inpatient Hospital Stay: Attending: Internal Medicine

## 2024-11-02 ENCOUNTER — Inpatient Hospital Stay

## 2024-11-02 DIAGNOSIS — C9210 Chronic myeloid leukemia, BCR/ABL-positive, not having achieved remission: Secondary | ICD-10-CM | POA: Diagnosis present

## 2024-11-02 LAB — CMP (CANCER CENTER ONLY)
ALT: 13 U/L (ref 0–44)
AST: 23 U/L (ref 15–41)
Albumin: 4.2 g/dL (ref 3.5–5.0)
Alkaline Phosphatase: 86 U/L (ref 38–126)
Anion gap: 10 (ref 5–15)
BUN: 12 mg/dL (ref 8–23)
CO2: 26 mmol/L (ref 22–32)
Calcium: 9.5 mg/dL (ref 8.9–10.3)
Chloride: 106 mmol/L (ref 98–111)
Creatinine: 0.86 mg/dL (ref 0.61–1.24)
GFR, Estimated: >60 mL/min
Glucose, Bld: 144 mg/dL — ABNORMAL HIGH (ref 70–99)
Potassium: 4 mmol/L (ref 3.5–5.1)
Sodium: 141 mmol/L (ref 135–145)
Total Bilirubin: 0.5 mg/dL (ref 0.0–1.2)
Total Protein: 6.6 g/dL (ref 6.5–8.1)

## 2024-11-02 LAB — CBC WITH DIFFERENTIAL (CANCER CENTER ONLY)
Abs Immature Granulocytes: 0.02 K/uL (ref 0.00–0.07)
Basophils Absolute: 0.1 K/uL (ref 0.0–0.1)
Basophils Relative: 1 %
Eosinophils Absolute: 0.2 K/uL (ref 0.0–0.5)
Eosinophils Relative: 3 %
HCT: 39.7 % (ref 39.0–52.0)
Hemoglobin: 13.5 g/dL (ref 13.0–17.0)
Immature Granulocytes: 0 %
Lymphocytes Relative: 24 %
Lymphs Abs: 1.6 K/uL (ref 0.7–4.0)
MCH: 32.5 pg (ref 26.0–34.0)
MCHC: 34 g/dL (ref 30.0–36.0)
MCV: 95.7 fL (ref 80.0–100.0)
Monocytes Absolute: 0.6 K/uL (ref 0.1–1.0)
Monocytes Relative: 9 %
Neutro Abs: 4.1 K/uL (ref 1.7–7.7)
Neutrophils Relative %: 63 %
Platelet Count: 173 K/uL (ref 150–400)
RBC: 4.15 MIL/uL — ABNORMAL LOW (ref 4.22–5.81)
RDW: 13 % (ref 11.5–15.5)
WBC Count: 6.5 K/uL (ref 4.0–10.5)
nRBC: 0 % (ref 0.0–0.2)

## 2024-11-08 LAB — BCR-ABL1, CML/ALL, PCR, QUANT
E1A2 Transcript: <0.0032 %
b2a2 transcript: <0.0032 %
b3a2 transcript: 0.0336 %

## 2024-11-10 ENCOUNTER — Other Ambulatory Visit: Payer: Self-pay

## 2024-11-14 ENCOUNTER — Other Ambulatory Visit (HOSPITAL_COMMUNITY): Payer: Self-pay

## 2024-11-16 ENCOUNTER — Other Ambulatory Visit: Payer: Self-pay

## 2024-11-16 ENCOUNTER — Other Ambulatory Visit (HOSPITAL_COMMUNITY): Payer: Self-pay

## 2024-11-16 NOTE — Progress Notes (Signed)
 Specialty Pharmacy Refill Coordination Note  Spoke with Benfer,Judy (Wife)  Kevin Love is a 79 y.o. male contacted today regarding refills of specialty medication(s) Dasatinib  (SPRYCEL )  Doses on hand: 8  Patient requested: Delivery   Delivery date: 11/17/24   Verified address: [address] [place] [address]  Medication will be filled on 11/16/24

## 2024-11-20 ENCOUNTER — Other Ambulatory Visit: Payer: Self-pay | Admitting: Surgery

## 2024-11-27 ENCOUNTER — Inpatient Hospital Stay: Admission: RE | Admit: 2024-11-27

## 2024-12-05 ENCOUNTER — Ambulatory Visit: Admit: 2024-12-05 | Admitting: Surgery

## 2025-01-31 ENCOUNTER — Inpatient Hospital Stay: Admitting: Oncology

## 2025-01-31 ENCOUNTER — Inpatient Hospital Stay
